# Patient Record
Sex: Female | Born: 1955 | Race: Black or African American | Hispanic: No | Smoking: Never smoker
Health system: Southern US, Community
[De-identification: ages and names within clinical notes are randomized; demographics above are authoritative.]

## PROBLEM LIST (undated history)

## (undated) DIAGNOSIS — F329 Major depressive disorder, single episode, unspecified: Secondary | ICD-10-CM

## (undated) DIAGNOSIS — G253 Myoclonus: Secondary | ICD-10-CM

## (undated) DIAGNOSIS — M549 Dorsalgia, unspecified: Secondary | ICD-10-CM

## (undated) DIAGNOSIS — F419 Anxiety disorder, unspecified: Secondary | ICD-10-CM

## (undated) DIAGNOSIS — R51 Headache: Secondary | ICD-10-CM

## (undated) DIAGNOSIS — E785 Hyperlipidemia, unspecified: Secondary | ICD-10-CM

## (undated) DIAGNOSIS — R519 Headache, unspecified: Secondary | ICD-10-CM

## (undated) DIAGNOSIS — M797 Fibromyalgia: Secondary | ICD-10-CM

## (undated) DIAGNOSIS — Z972 Presence of dental prosthetic device (complete) (partial): Secondary | ICD-10-CM

## (undated) DIAGNOSIS — G4700 Insomnia, unspecified: Secondary | ICD-10-CM

## (undated) DIAGNOSIS — M1990 Unspecified osteoarthritis, unspecified site: Secondary | ICD-10-CM

## (undated) DIAGNOSIS — I10 Essential (primary) hypertension: Secondary | ICD-10-CM

## (undated) DIAGNOSIS — R011 Cardiac murmur, unspecified: Secondary | ICD-10-CM

## (undated) DIAGNOSIS — R42 Dizziness and giddiness: Secondary | ICD-10-CM

## (undated) DIAGNOSIS — K219 Gastro-esophageal reflux disease without esophagitis: Secondary | ICD-10-CM

## (undated) DIAGNOSIS — R682 Dry mouth, unspecified: Secondary | ICD-10-CM

## (undated) DIAGNOSIS — F32A Depression, unspecified: Secondary | ICD-10-CM

## (undated) DIAGNOSIS — E119 Type 2 diabetes mellitus without complications: Secondary | ICD-10-CM

## (undated) DIAGNOSIS — K2270 Barrett's esophagus without dysplasia: Secondary | ICD-10-CM

## (undated) DIAGNOSIS — K5900 Constipation, unspecified: Secondary | ICD-10-CM

## (undated) DIAGNOSIS — G8929 Other chronic pain: Secondary | ICD-10-CM

## (undated) DIAGNOSIS — F259 Schizoaffective disorder, unspecified: Secondary | ICD-10-CM

## (undated) HISTORY — DX: Fibromyalgia: M79.7

## (undated) HISTORY — DX: Depression, unspecified: F32.A

## (undated) HISTORY — DX: Dry mouth, unspecified: R68.2

## (undated) HISTORY — DX: Hyperlipidemia, unspecified: E78.5

## (undated) HISTORY — PX: HAND SURGERY: SHX662

## (undated) HISTORY — PX: BUNIONECTOMY: SHX129

## (undated) HISTORY — DX: Dizziness and giddiness: R42

## (undated) HISTORY — DX: Other chronic pain: G89.29

## (undated) HISTORY — DX: Headache, unspecified: R51.9

## (undated) HISTORY — DX: Gastro-esophageal reflux disease without esophagitis: K21.9

## (undated) HISTORY — DX: Anxiety disorder, unspecified: F41.9

## (undated) HISTORY — DX: Dorsalgia, unspecified: M54.9

## (undated) HISTORY — PX: BREAST EXCISIONAL BIOPSY: SUR124

## (undated) HISTORY — DX: Insomnia, unspecified: G47.00

## (undated) HISTORY — DX: Presence of dental prosthetic device (complete) (partial): Z97.2

## (undated) HISTORY — PX: ABDOMINAL HYSTERECTOMY: SHX81

## (undated) HISTORY — DX: Myoclonus: G25.3

## (undated) HISTORY — PX: MULTIPLE TOOTH EXTRACTIONS: SHX2053

## (undated) HISTORY — DX: Schizoaffective disorder, unspecified: F25.9

## (undated) HISTORY — DX: Headache: R51

## (undated) HISTORY — DX: Type 2 diabetes mellitus without complications: E11.9

## (undated) HISTORY — DX: Major depressive disorder, single episode, unspecified: F32.9

## (undated) HISTORY — DX: Unspecified osteoarthritis, unspecified site: M19.90

## (undated) HISTORY — DX: Constipation, unspecified: K59.00

## (undated) HISTORY — PX: TRIGGER FINGER RELEASE: SHX641

## (undated) HISTORY — DX: Barrett's esophagus without dysplasia: K22.70

---

## 2000-04-29 ENCOUNTER — Ambulatory Visit (HOSPITAL_BASED_OUTPATIENT_CLINIC_OR_DEPARTMENT_OTHER): Admission: RE | Admit: 2000-04-29 | Discharge: 2000-04-29 | Payer: Self-pay | Admitting: Orthopedic Surgery

## 2000-08-24 ENCOUNTER — Encounter: Admission: RE | Admit: 2000-08-24 | Discharge: 2000-08-24 | Payer: Self-pay | Admitting: Rheumatology

## 2000-08-24 ENCOUNTER — Encounter: Payer: Self-pay | Admitting: Rheumatology

## 2000-12-16 ENCOUNTER — Ambulatory Visit (HOSPITAL_BASED_OUTPATIENT_CLINIC_OR_DEPARTMENT_OTHER): Admission: RE | Admit: 2000-12-16 | Discharge: 2000-12-16 | Payer: Self-pay | Admitting: Orthopedic Surgery

## 2001-01-26 ENCOUNTER — Ambulatory Visit (HOSPITAL_BASED_OUTPATIENT_CLINIC_OR_DEPARTMENT_OTHER): Admission: RE | Admit: 2001-01-26 | Discharge: 2001-01-26 | Payer: Self-pay | Admitting: Orthopedic Surgery

## 2001-09-27 ENCOUNTER — Other Ambulatory Visit: Admission: RE | Admit: 2001-09-27 | Discharge: 2001-09-27 | Payer: Self-pay | Admitting: Internal Medicine

## 2013-08-15 HISTORY — PX: COLONOSCOPY: SHX174

## 2013-09-15 DIAGNOSIS — M159 Polyosteoarthritis, unspecified: Secondary | ICD-10-CM

## 2013-09-15 HISTORY — DX: Polyosteoarthritis, unspecified: M15.9

## 2014-01-03 DIAGNOSIS — M51379 Other intervertebral disc degeneration, lumbosacral region without mention of lumbar back pain or lower extremity pain: Secondary | ICD-10-CM

## 2014-01-03 DIAGNOSIS — M5137 Other intervertebral disc degeneration, lumbosacral region: Secondary | ICD-10-CM | POA: Insufficient documentation

## 2014-01-03 HISTORY — DX: Other intervertebral disc degeneration, lumbosacral region without mention of lumbar back pain or lower extremity pain: M51.379

## 2014-04-07 DIAGNOSIS — E119 Type 2 diabetes mellitus without complications: Secondary | ICD-10-CM

## 2014-04-07 HISTORY — DX: Type 2 diabetes mellitus without complications: E11.9

## 2016-02-06 DIAGNOSIS — M67919 Unspecified disorder of synovium and tendon, unspecified shoulder: Secondary | ICD-10-CM | POA: Insufficient documentation

## 2016-02-06 DIAGNOSIS — M7750 Other enthesopathy of unspecified foot: Secondary | ICD-10-CM | POA: Insufficient documentation

## 2016-02-06 DIAGNOSIS — J019 Acute sinusitis, unspecified: Secondary | ICD-10-CM

## 2016-02-06 DIAGNOSIS — M7072 Other bursitis of hip, left hip: Secondary | ICD-10-CM

## 2016-02-06 DIAGNOSIS — L309 Dermatitis, unspecified: Secondary | ICD-10-CM

## 2016-02-06 DIAGNOSIS — R55 Syncope and collapse: Secondary | ICD-10-CM

## 2016-02-06 DIAGNOSIS — M5412 Radiculopathy, cervical region: Secondary | ICD-10-CM

## 2016-02-06 HISTORY — DX: Unspecified disorder of synovium and tendon, unspecified shoulder: M67.919

## 2016-02-06 HISTORY — DX: Other enthesopathy of unspecified foot and ankle: M77.50

## 2016-02-06 HISTORY — DX: Other bursitis of hip, left hip: M70.72

## 2016-02-06 HISTORY — DX: Dermatitis, unspecified: L30.9

## 2016-02-06 HISTORY — DX: Syncope and collapse: R55

## 2016-02-06 HISTORY — DX: Radiculopathy, cervical region: M54.12

## 2016-02-06 HISTORY — DX: Acute sinusitis, unspecified: J01.90

## 2016-02-07 DIAGNOSIS — M722 Plantar fascial fibromatosis: Secondary | ICD-10-CM | POA: Insufficient documentation

## 2016-02-07 DIAGNOSIS — N952 Postmenopausal atrophic vaginitis: Secondary | ICD-10-CM | POA: Insufficient documentation

## 2016-02-07 DIAGNOSIS — T65221A Toxic effect of tobacco cigarettes, accidental (unintentional), initial encounter: Secondary | ICD-10-CM

## 2016-02-07 DIAGNOSIS — M5030 Other cervical disc degeneration, unspecified cervical region: Secondary | ICD-10-CM | POA: Insufficient documentation

## 2016-02-07 DIAGNOSIS — G43909 Migraine, unspecified, not intractable, without status migrainosus: Secondary | ICD-10-CM | POA: Insufficient documentation

## 2016-02-07 DIAGNOSIS — K140 Glossitis: Secondary | ICD-10-CM

## 2016-02-07 DIAGNOSIS — M791 Myalgia, unspecified site: Secondary | ICD-10-CM

## 2016-02-07 DIAGNOSIS — H9209 Otalgia, unspecified ear: Secondary | ICD-10-CM

## 2016-02-07 DIAGNOSIS — G4761 Periodic limb movement disorder: Secondary | ICD-10-CM

## 2016-02-07 DIAGNOSIS — E669 Obesity, unspecified: Secondary | ICD-10-CM

## 2016-02-07 DIAGNOSIS — N905 Atrophy of vulva: Secondary | ICD-10-CM

## 2016-02-07 DIAGNOSIS — N959 Unspecified menopausal and perimenopausal disorder: Secondary | ICD-10-CM

## 2016-02-07 DIAGNOSIS — J301 Allergic rhinitis due to pollen: Secondary | ICD-10-CM | POA: Insufficient documentation

## 2016-02-07 DIAGNOSIS — M25519 Pain in unspecified shoulder: Secondary | ICD-10-CM

## 2016-02-07 DIAGNOSIS — M609 Myositis, unspecified: Secondary | ICD-10-CM

## 2016-02-07 DIAGNOSIS — G2581 Restless legs syndrome: Secondary | ICD-10-CM | POA: Insufficient documentation

## 2016-02-07 DIAGNOSIS — M25569 Pain in unspecified knee: Secondary | ICD-10-CM

## 2016-02-07 HISTORY — DX: Myositis, unspecified: M60.9

## 2016-02-07 HISTORY — DX: Atrophy of vulva: N90.5

## 2016-02-07 HISTORY — DX: Restless legs syndrome: G25.81

## 2016-02-07 HISTORY — DX: Pain in unspecified knee: M25.569

## 2016-02-07 HISTORY — DX: Otalgia, unspecified ear: H92.09

## 2016-02-07 HISTORY — DX: Unspecified menopausal and perimenopausal disorder: N95.9

## 2016-02-07 HISTORY — DX: Other cervical disc degeneration, unspecified cervical region: M50.30

## 2016-02-07 HISTORY — DX: Pain in unspecified shoulder: M25.519

## 2016-02-07 HISTORY — DX: Allergic rhinitis due to pollen: J30.1

## 2016-02-07 HISTORY — DX: Toxic effect of tobacco cigarettes, accidental (unintentional), initial encounter: T65.221A

## 2016-02-07 HISTORY — DX: Glossitis: K14.0

## 2016-02-07 HISTORY — DX: Periodic limb movement disorder: G47.61

## 2016-02-07 HISTORY — DX: Obesity, unspecified: E66.9

## 2016-02-07 HISTORY — DX: Migraine, unspecified, not intractable, without status migrainosus: G43.909

## 2016-02-07 HISTORY — DX: Morbid (severe) obesity due to excess calories: E66.01

## 2016-02-07 HISTORY — DX: Plantar fascial fibromatosis: M72.2

## 2016-02-07 HISTORY — DX: Myalgia, unspecified site: M79.10

## 2016-02-07 HISTORY — DX: Postmenopausal atrophic vaginitis: N95.2

## 2016-07-08 DIAGNOSIS — R441 Visual hallucinations: Secondary | ICD-10-CM | POA: Insufficient documentation

## 2016-07-08 DIAGNOSIS — R440 Auditory hallucinations: Secondary | ICD-10-CM

## 2016-07-08 HISTORY — DX: Auditory hallucinations: R44.0

## 2016-07-08 HISTORY — DX: Visual hallucinations: R44.1

## 2017-01-06 ENCOUNTER — Ambulatory Visit: Payer: Medicare (Managed Care) | Admitting: Psychology

## 2017-01-26 DIAGNOSIS — H2513 Age-related nuclear cataract, bilateral: Secondary | ICD-10-CM | POA: Insufficient documentation

## 2017-01-26 DIAGNOSIS — H18599 Other hereditary corneal dystrophies, unspecified eye: Secondary | ICD-10-CM

## 2017-01-26 DIAGNOSIS — H25013 Cortical age-related cataract, bilateral: Secondary | ICD-10-CM | POA: Insufficient documentation

## 2017-01-26 DIAGNOSIS — H524 Presbyopia: Secondary | ICD-10-CM | POA: Insufficient documentation

## 2017-01-26 HISTORY — DX: Age-related nuclear cataract, bilateral: H25.13

## 2017-01-26 HISTORY — DX: Presbyopia: H52.4

## 2017-01-26 HISTORY — DX: Other hereditary corneal dystrophies, unspecified eye: H18.599

## 2017-01-26 HISTORY — DX: Cortical age-related cataract, bilateral: H25.013

## 2017-01-27 ENCOUNTER — Other Ambulatory Visit: Payer: Self-pay | Admitting: Internal Medicine

## 2017-01-27 ENCOUNTER — Ambulatory Visit
Admission: RE | Admit: 2017-01-27 | Discharge: 2017-01-27 | Disposition: A | Discharge status: Home / Self Care | Payer: Medicare (Managed Care) | Source: Ambulatory Visit | Attending: Internal Medicine | Admitting: Internal Medicine

## 2017-01-27 DIAGNOSIS — M545 Low back pain: Secondary | ICD-10-CM

## 2017-03-18 ENCOUNTER — Other Ambulatory Visit: Payer: Self-pay | Admitting: Internal Medicine

## 2017-03-18 DIAGNOSIS — Z1231 Encounter for screening mammogram for malignant neoplasm of breast: Secondary | ICD-10-CM

## 2017-04-07 ENCOUNTER — Ambulatory Visit
Admission: RE | Admit: 2017-04-07 | Discharge: 2017-04-07 | Disposition: A | Discharge status: Home / Self Care | Payer: Medicare (Managed Care) | Source: Ambulatory Visit | Attending: Internal Medicine | Admitting: Internal Medicine

## 2017-04-07 DIAGNOSIS — Z1231 Encounter for screening mammogram for malignant neoplasm of breast: Secondary | ICD-10-CM

## 2017-09-21 ENCOUNTER — Ambulatory Visit (INDEPENDENT_AMBULATORY_CARE_PROVIDER_SITE_OTHER): Payer: Medicare (Managed Care) | Admitting: Psychology

## 2017-09-21 DIAGNOSIS — F250 Schizoaffective disorder, bipolar type: Secondary | ICD-10-CM | POA: Diagnosis not present

## 2017-09-22 ENCOUNTER — Encounter: Payer: Self-pay | Admitting: Nurse Practitioner

## 2017-09-22 ENCOUNTER — Ambulatory Visit: Payer: Self-pay | Admitting: Psychology

## 2017-09-22 ENCOUNTER — Ambulatory Visit (INDEPENDENT_AMBULATORY_CARE_PROVIDER_SITE_OTHER): Payer: Medicare (Managed Care) | Admitting: Nurse Practitioner

## 2017-09-22 VITALS — BP 110/70 | HR 80 | Ht 64.0 in | Wt 247.0 lb

## 2017-09-22 DIAGNOSIS — K22719 Barrett's esophagus with dysplasia, unspecified: Secondary | ICD-10-CM

## 2017-09-22 DIAGNOSIS — D649 Anemia, unspecified: Secondary | ICD-10-CM | POA: Diagnosis not present

## 2017-09-22 DIAGNOSIS — Z860100 Personal history of colon polyps, unspecified: Secondary | ICD-10-CM

## 2017-09-22 DIAGNOSIS — K5909 Other constipation: Secondary | ICD-10-CM | POA: Diagnosis not present

## 2017-09-22 NOTE — Progress Notes (Addendum)
Addendum 10/14/2017: Records received from Dr. Ferdinand Lango  EGD 09/26/2015 for evaluation of abdominal pain and GERD.  Findings included esophagitis and gastritis.    Antral, gastric and cardia biopsies compatible with chronic gastritis with H. pylori.  No intestinal metaplasia.     Biopsies 2 cm above the GE junction show inflamed gastric-type mucosa with no Barrett's.    Biopsies 4 cm above GE junction inflamed gastric-type mucosa with intestinal metaplasia.  No dysplasia or malignancy.  Esophageal biopsies at 35, 30, 28 and 25, 23 and 20 cm all compatible with gastroesophageal reflux disease.  Colonoscopy 09/18/2014 done for personal history of colon polyps and constipation.  Stent of exam to the cecum.  Bowel prep was inadequate.  No polyps were seen.  Erythema and erosion at 55 cm.  Biopsy of the area showed reactive edematous colonic mucosa with erosion.  No dysplasia.  Colonoscopy September 2014.  Extent of exam to the cecum.  Inadequate bowel prep.  10 mm cecal polyp removed.  Path compatible tubular adenoma without high-grade dysplasia.  I will scanned records into epic and also send a copy to Dr.Danis so he can decide about the timing of repeat upper endoscopy for history of Barrett's disease and also the timing of repeat colonoscopy.  Her bowel prep in 2014 and 2016 were in adequate so she will need a 2-day prep for sure __________________________________________________________________ HPI: Patient is a 61 year old female referred by Dr. Dorian Pod with [facility], for evaluation of Barrett's esophagus. I reviewed records sent by Dr. Jimmye Norman. Patient has well-controlled DM 2, schizoaffective disorder bipolar type., myofascial pain syndrome , chronic constipation, GERD and history of Barrett's esophagus. Per PCP patient's last EGD was in 2016 by Dr. Harrell Lark with [facility]. I do not have GI records. Patient takes Zantac for GERD. She has chronic  constipation and requires Linzess and MiraLAX. No rectal bleeding. Patient says she had a colonoscopy 1.5 years ago, also by Dr. Ferdinand Lango, told she needed another colonoscopy in one year so she is 6 months overdue. Again I do not have GI records. Patient tells me she is switching from Dr. Ferdinand Lango to our practice because this is where the Dr. Jimmye Norman sent her.   Ms Graffius has no complaints. She denies GERD sx. Takes daily Zantac. Bowels moving okay. No blood in her stool. No abdominal pain  Labs dated 07/24/17 reveal a hemoglobin of 10.4, MCV 92. Hemoglobin is stable, it was 10.4 in January.Renal function is normal   Past Medical History:  Diagnosis Date  . Barrett's esophagus   . Chronic headaches   . Diabetes ([place])   . Fibromyalgia   . GERD (gastroesophageal reflux disease)   . Hyperlipemia   . Myoclonus   . Osteoarthritis   . Vertigo      Past Surgical History:  Procedure Laterality Date  . ABDOMINAL HYSTERECTOMY    . BREAST EXCISIONAL BIOPSY Right   . BREAST EXCISIONAL BIOPSY Right   . BREAST EXCISIONAL BIOPSY Right   . BREAST EXCISIONAL BIOPSY Right    Family History  Problem Relation Age of Onset  . Diabetes Mother   . Prostate cancer Father    Social History  Substance Use Topics  . Smoking status: Never Smoker  . Smokeless tobacco: Not on file  . Alcohol use No   Current Outpatient Prescriptions  Medication Sig Dispense Refill  . acetaminophen (TYLENOL) 650 MG suppository Place 650 mg rectally 2 (two) times daily.    Marland Kitchen  antiseptic oral rinse (BIOTENE) LIQD 15 mLs by Mouth Rinse route 6 (six) times daily.    . Artificial Saliva (BIOTENE MOISTURIZING MOUTH MT) Use as directed 1 spray in the mouth or throat 6 (six) times daily.    Marland Kitchen aspirin EC 81 MG tablet Take 81 mg by mouth daily.    . DULoxetine (CYMBALTA) 60 MG capsule Take 60 mg by mouth 2 (two) times daily.    . fluPHENAZine (PROLIXIN) 5 MG tablet Take 5 mg by mouth 2 (two) times daily.    Marland Kitchen gabapentin  (NEURONTIN) 600 MG tablet Take 600 mg by mouth at bedtime.    Marland Kitchen linaclotide (LINZESS) 145 MCG CAPS capsule Take 145 mcg by mouth daily before breakfast.    . Melatonin 10 MG CAPS Take 1 capsule by mouth daily.    . Menthol, Topical Analgesic, (BIOFREEZE) 4 % GEL Apply 1 application topically 2 (two) times daily as needed.    . nortriptyline (PAMELOR) 50 MG capsule Take 50 mg by mouth at bedtime.    . paliperidone (INVEGA SUSTENNA) 156 MG/ML SUSP injection Inject 156 mg into the muscle every 30 (thirty) days.    . polyethylene glycol (MIRALAX / GLYCOLAX) packet Take 17 g by mouth daily.    . ranitidine (ZANTAC) 300 MG tablet Take 300 mg by mouth at bedtime.    . risperiDONE (RISPERDAL) 2 MG tablet Take 2 mg by mouth 2 (two) times daily.    . sodium chloride (MURO 128) 5 % ophthalmic ointment Place 1 application into both eyes 4 (four) times daily as needed for eye irritation.     No current facility-administered medications for this visit.    Allergies  Allergen Reactions  . Amitriptyline   . Flexeril [Cyclobenzaprine]   . Penicillins      Review of Systems: Positive for anxiety, arthritis, back pain, vision changes, depression, headaches, heart murmur, sleeping problems and urine leakage All other systems reviewed and negative except where noted in HPI.    Physical Exam: BP 110/70   Pulse 80   Ht 5\' 4"  (1.626 m)   Wt 247 lb (112 kg)   BMI 42.40 kg/m  Constitutional:  Obese black female in no acute distress. Psychiatric: Pleasant, cooperative, flat affect. EENT: Pupils normal.  Conjunctivae are normal. No scleral icterus. Neck supple.  Cardiovascular: Normal rate, regular rhythm. No edema Pulmonary/chest: Effort normal and breath sounds normal. No wheezing, rales or rhonchi. Abdominal: Soft, nondistended. Nontender. Bowel sounds active throughout. There are no masses palpable. No hepatomegaly. Lymphadenopathy: No cervical adenopathy noted. Neurological: Alert and oriented to  person place and time. Skin: Skin is warm and dry. No rashes noted.   ASSESSMENT AND PLAN:  60. 61 yo female with history of GERD  / Barrett's esophagus followed by Dr. Harrell Lark. I don't have GI records but last EGD apparently done in 2016. She has occasional pyrosis, takes daily Zantac.  -Patient believes she is due for surveillance EGD. I have requested records from Dr. Ferdinand Lango. Once reviewed we will contact patient about date of next EGD.   2. Hx of colon polyps per patient. I do not have GI records. Patient says she had a colonoscopy 1.5 years ago by Dr. Harrell Lark and is overdue for one year follow-up.  -I will need to await Dr. Ferdinand Lango records and get back with the patient with recommendations regarding surveillance colonoscopy  3. Chronic mild normocytic anemia. Hemoglobin has been stable in the upper 10 range. No overt GI blood loss.  4. Well controlled DM2  5. Schizoaffective disorder. She is on prolixin, invega and risperidal  6. Chronic constipation, likely medication induced. Controlled with linzess and miralax.   I spent 25 minutes of face-to-face time with the patient. Greater than 50% of the time was spent counseling and coordinating care. Questions answered  Tye Savoy, NP  09/22/2017, 11:41 AM  Cc:  Dorian Pod, MD

## 2017-09-22 NOTE — Patient Instructions (Signed)
If you are age 62 or older, your body mass index should be between 23-30. Your Body mass index is 42.4 kg/m. If this is out of the aforementioned range listed, please consider follow up with your Primary Care Provider.  If you are age 40 or younger, your body mass index should be between 19-25. Your Body mass index is 42.4 kg/m. If this is out of the aformentioned range listed, please consider follow up with your Primary Care Provider.   We have requested GI records from Dr. Ferdinand Lango at [facility].  We will call regarding EGD/ Colon after records are reviewed.  Thank you for choosing me and [facility].   Tye Savoy, NP

## 2017-09-24 NOTE — Progress Notes (Addendum)
Thank you for sending this case to me. I have reviewed the entire note, and the outlined plan seems appropriate.  My experience is that any and all reported endoscopic findings by Dr. Ferdinand Lango are questionable. We must have records to see if this can be clarified before deciding if procedures are warranted at this time.  Wilfrid Lund, MD   10/20/17 Addendum:    Thank you for the record review and update. Still unclear to me if true Barrett's. Also unclear what is meant by inadequate prep, but we must assume it was an accurate description and limiting mucosal visualization.  Please schedule EGD (Dx: Barrett's without dysplasia)  and colonoscopy (Dx: history of adenomatous colon polyp).  Yes, needs 2 day colon prep and early to mid AM appointment in case shows up incompletely prepped.  OK for [facility].  Wilfrid Lund, MD

## 2017-10-08 ENCOUNTER — Ambulatory Visit (INDEPENDENT_AMBULATORY_CARE_PROVIDER_SITE_OTHER): Payer: Self-pay | Admitting: Psychology

## 2017-10-08 DIAGNOSIS — F250 Schizoaffective disorder, bipolar type: Secondary | ICD-10-CM

## 2017-10-20 ENCOUNTER — Telehealth: Payer: Self-pay | Admitting: Gastroenterology

## 2017-10-20 NOTE — Telephone Encounter (Signed)
Please see my addendum to your record review with plan to schedule procedures. - HD

## 2017-10-22 ENCOUNTER — Telehealth: Payer: Self-pay

## 2017-10-22 ENCOUNTER — Other Ambulatory Visit: Payer: Self-pay

## 2017-10-22 MED ORDER — POLYETHYLENE GLYCOL 3350 17 GM/SCOOP PO POWD: ORAL | 0 refills | Status: DC

## 2017-10-22 MED ORDER — SUPREP BOWEL PREP KIT 17.5-3.13-1.6 GM/177ML PO SOLN: 1.0000 | ORAL | 0 refills | Status: DC

## 2017-10-22 NOTE — Telephone Encounter (Signed)
Rx faxed to [facility] [phone]

## 2017-10-22 NOTE — Telephone Encounter (Signed)
Patient contacted and scheduled for the pre-op visit and her EGD/colon. [facility] provides transportation. They have been contacted. Spoke to [place]. Pre-op visit 11/10/17 at 1:30 pm. Procedure date 11/20/17 arrive 2:00 pm with a care partner. The prescription for her 2 day prep faxed to

## 2017-11-12 ENCOUNTER — Ambulatory Visit: Payer: Medicaid Other | Admitting: Psychology

## 2017-11-20 ENCOUNTER — Encounter: Payer: Self-pay | Admitting: Gastroenterology

## 2017-12-24 ENCOUNTER — Ambulatory Visit (AMBULATORY_SURGERY_CENTER): Payer: Self-pay | Admitting: *Deleted

## 2017-12-24 ENCOUNTER — Other Ambulatory Visit: Payer: Self-pay

## 2017-12-24 ENCOUNTER — Telehealth: Payer: Self-pay | Admitting: Gastroenterology

## 2017-12-24 VITALS — Ht 63.0 in | Wt 247.2 lb

## 2017-12-24 DIAGNOSIS — Z86010 Personal history of colonic polyps: Secondary | ICD-10-CM

## 2017-12-24 DIAGNOSIS — K22719 Barrett's esophagus with dysplasia, unspecified: Secondary | ICD-10-CM

## 2017-12-24 MED ORDER — NA SULFATE-K SULFATE-MG SULF 17.5-3.13-1.6 GM/177ML PO SOLN: 1.0000 | Freq: Once | ORAL | 0 refills | Status: AC

## 2017-12-24 MED ORDER — POLYETHYLENE GLYCOL 3350 17 GM/SCOOP PO POWD: 1.0000 | Freq: Every day | ORAL | 3 refills | Status: DC

## 2017-12-24 MED ORDER — BISACODYL 5 MG PO TBEC: 5.0000 mg | DELAYED_RELEASE_TABLET | Freq: Every day | ORAL | 0 refills | Status: DC | PRN

## 2017-12-24 NOTE — Progress Notes (Signed)
Patient denies any allergies to egg or soy products. Patient denies complications with anesthesia/sedation.  Patient denies oxygen use at home and denies diet medications. Pamphlets given to patient on colonoscopy and endoscopy.  [facility] uses [facility].  Written prescriptions were given to patient at [facility] appointment for suprep, dulcolax and miralax.  Dr Loletha Carrow signed all three prescriptions.  Patient will take them to [facility] in [place] to get filled.

## 2017-12-24 NOTE — Telephone Encounter (Signed)
Spoke to [place] at [facility] discussed instructions- Faxed a set of instructions to her at fax number [phone]  Lelan Pons PV

## 2018-01-07 ENCOUNTER — Ambulatory Visit (AMBULATORY_SURGERY_CENTER): Payer: Medicare (Managed Care) | Admitting: Gastroenterology

## 2018-01-07 ENCOUNTER — Other Ambulatory Visit: Payer: Self-pay

## 2018-01-07 ENCOUNTER — Encounter: Payer: Self-pay | Admitting: Gastroenterology

## 2018-01-07 VITALS — BP 125/73 | HR 81 | Temp 98.0°F | Resp 10 | Ht 63.0 in | Wt 247.0 lb

## 2018-01-07 DIAGNOSIS — Z860100 Personal history of colon polyps, unspecified: Secondary | ICD-10-CM

## 2018-01-07 DIAGNOSIS — D123 Benign neoplasm of transverse colon: Secondary | ICD-10-CM | POA: Diagnosis not present

## 2018-01-07 DIAGNOSIS — D122 Benign neoplasm of ascending colon: Secondary | ICD-10-CM

## 2018-01-07 DIAGNOSIS — K22719 Barrett's esophagus with dysplasia, unspecified: Secondary | ICD-10-CM | POA: Diagnosis not present

## 2018-01-07 MED ORDER — SODIUM CHLORIDE 0.9 % IV SOLN: 500.0000 mL | Freq: Once | INTRAVENOUS | Status: DC

## 2018-01-07 NOTE — Progress Notes (Signed)
Called to room to assist during endoscopic procedure.  Patient ID and intended procedure confirmed with present staff. Received instructions for my participation in the procedure from the performing physician.  

## 2018-01-07 NOTE — Op Note (Signed)
[facility] Patient Name: Bridget [name] Procedure Date: 01/07/2018 1:52 PM MRN: [id number] Endoscopist: Mallie Mussel L. Loletha Carrow , MD Age: 62 Referring MD:  Date of Birth: 01/26/1956 Gender: Female Account #: [id number] Procedure:                Colonoscopy Indications:              Surveillance: Personal history of adenomatous                            polyps on last colonoscopy > 3 years ago (tubular                            adenoma 2014, no polyps 2015, "inadequate prep"                            reported on both exams by Dr. Virgel Bouquet) Medicines:                Monitored Anesthesia Care Procedure:                Pre-Anesthesia Assessment:                           - Prior to the procedure, a History and Physical                            was performed, and patient medications and                            allergies were reviewed. The patient's tolerance of                            previous anesthesia was also reviewed. The risks                            and benefits of the procedure and the sedation                            options and risks were discussed with the patient.                            All questions were answered, and informed consent                            was obtained. Prior Anticoagulants: The patient has                            taken no previous anticoagulant or antiplatelet                            agents. ASA Grade Assessment: II - A patient with                            mild systemic disease. After reviewing the risks  and benefits, the patient was deemed in                            satisfactory condition to undergo the procedure.                           After obtaining informed consent, the colonoscope                            was passed under direct vision. Throughout the                            procedure, the patient's blood pressure, pulse, and                            oxygen saturations  were monitored continuously. The                            Colonoscope was introduced through the anus and                            advanced to the the cecum, identified by                            appendiceal orifice and ileocecal valve. The                            colonoscopy was performed without difficulty. The                            patient tolerated the procedure well. The quality                            of the bowel preparation was good. The ileocecal                            valve, appendiceal orifice, and rectum were                            photographed. The bowel preparation used was                            Miralax and SUPREP (2-day). Scope In: 2:06:05 PM Scope Out: 2:20:33 PM Scope Withdrawal Time: 0 hours 10 minutes 48 seconds  Total Procedure Duration: 0 hours 14 minutes 28 seconds  Findings:                 The perianal and digital rectal examinations were                            normal.                           Two sessile polyps were found in the transverse  colon and ascending colon. The polyps were 2 to 4                            mm in size. These polyps were removed with a cold                            snare. Resection and retrieval were complete.                           Retroflexion in the rectum was not performed due to                            anatomy.                           The exam was otherwise without abnormality. Complications:            No immediate complications. Estimated Blood Loss:     Estimated blood loss was minimal. Impression:               - Two 2 to 4 mm polyps in the transverse colon and                            in the ascending colon, removed with a cold snare.                            Resected and retrieved.                           - The examination was otherwise normal. Recommendation:           - Patient has a contact number available for                             emergencies. The signs and symptoms of potential                            delayed complications were discussed with the                            patient. Return to normal activities tomorrow.                            Written discharge instructions were provided to the                            patient.                           - Resume previous diet.                           - Continue present medications.                           - Await pathology results.                           -  Repeat colonoscopy is recommended for                            surveillance. The colonoscopy date will be                            determined after pathology results from today's                            exam become available for review. Dreyah Montrose L. Loletha Carrow, MD 01/07/2018 2:28:13 PM This report has been signed electronically.

## 2018-01-07 NOTE — Progress Notes (Signed)
Report given to PACU, vss 

## 2018-01-07 NOTE — Patient Instructions (Signed)
Discharge instructions given. Handout on polyps. Resume previous medications. YOU HAD AN ENDOSCOPIC PROCEDURE TODAY AT [facility]:   Refer to the procedure report that was given to you for any specific questions about what was found during the examination.  If the procedure report does not answer your questions, please call your gastroenterologist to clarify.  If you requested that your care partner not be given the details of your procedure findings, then the procedure report has been included in a sealed envelope for you to review at your convenience later.  YOU SHOULD EXPECT: Some feelings of bloating in the abdomen. Passage of more gas than usual.  Walking can help get rid of the air that was put into your GI tract during the procedure and reduce the bloating. If you had a lower endoscopy (such as a colonoscopy or flexible sigmoidoscopy) you may notice spotting of blood in your stool or on the toilet paper. If you underwent a bowel prep for your procedure, you may not have a normal bowel movement for a few days.  Please Note:  You might notice some irritation and congestion in your nose or some drainage.  This is from the oxygen used during your procedure.  There is no need for concern and it should clear up in a day or so.  SYMPTOMS TO REPORT IMMEDIATELY:   Following lower endoscopy (colonoscopy or flexible sigmoidoscopy):  Excessive amounts of blood in the stool  Significant tenderness or worsening of abdominal pains  Swelling of the abdomen that is new, acute  Fever of 100F or higher   Following upper endoscopy (EGD)  Vomiting of blood or coffee ground material  New chest pain or pain under the shoulder blades  Painful or persistently difficult swallowing  New shortness of breath  Fever of 100F or higher  Black, tarry-looking stools  For urgent or emergent issues, a gastroenterologist can be reached at any hour by calling [phone].   DIET:  We do  recommend a small meal at first, but then you may proceed to your regular diet.  Drink plenty of fluids but you should avoid alcoholic beverages for 24 hours.  ACTIVITY:  You should plan to take it easy for the rest of today and you should NOT DRIVE or use heavy machinery until tomorrow (because of the sedation medicines used during the test).    FOLLOW UP: Our staff will call the number listed on your records the next business day following your procedure to check on you and address any questions or concerns that you may have regarding the information given to you following your procedure. If we do not reach you, we will leave a message.  However, if you are feeling well and you are not experiencing any problems, there is no need to return our call.  We will assume that you have returned to your regular daily activities without incident.  If any biopsies were taken you will be contacted by phone or by letter within the next 1-3 weeks.  Please call us at [phone] if you have not heard about the biopsies in 3 weeks.    SIGNATURES/CONFIDENTIALITY: You and/or your care partner have signed paperwork which will be entered into your electronic medical record.  These signatures attest to the fact that that the information above on your After Visit Summary has been reviewed and is understood.  Full responsibility of the confidentiality of this discharge information lies with you and/or your care-partner.

## 2018-01-07 NOTE — Op Note (Signed)
[facility] Patient Name: Bridget [name] Procedure Date: 01/07/2018 1:53 PM MRN: [id number] Endoscopist: Mallie Mussel L. Loletha Carrow , MD Age: 62 Referring MD:  Date of Birth: Jan 26, 1956 Gender: Female Account #: [id number] Procedure:                Upper GI endoscopy Indications:              Barrett's esophagus (based on prior EGD and                            pathology reports by Dr. Virgel Bouquet) Medicines:                Monitored Anesthesia Care Procedure:                Pre-Anesthesia Assessment:                           - Prior to the procedure, a History and Physical                            was performed, and patient medications and                            allergies were reviewed. The patient's tolerance of                            previous anesthesia was also reviewed. The risks                            and benefits of the procedure and the sedation                            options and risks were discussed with the patient.                            All questions were answered, and informed consent                            was obtained. Prior Anticoagulants: The patient has                            taken no previous anticoagulant or antiplatelet                            agents. ASA Grade Assessment: II - A patient with                            mild systemic disease. After reviewing the risks                            and benefits, the patient was deemed in                            satisfactory condition to undergo the procedure.  After obtaining informed consent, the endoscope was                            passed under direct vision. Throughout the                            procedure, the patient's blood pressure, pulse, and                            oxygen saturations were monitored continuously. The                            Endoscope was introduced through the mouth, and                            advanced to the second  part of duodenum. The upper                            GI endoscopy was accomplished without difficulty.                            The patient tolerated the procedure well. Scope In: Scope Out: Findings:                 The examined esophagus was normal.                           The stomach was normal.                           The cardia and gastric fundus were normal on                            retroflexion.                           The examined duodenum was normal. Complications:            No immediate complications. Estimated Blood Loss:     Estimated blood loss: none. Impression:               - Normal esophagus. No Barrett's esophagus seen.                            Therefore, no future surveillance EGD needed.                           - Normal stomach.                           - Normal examined duodenum.                           - No specimens collected. Recommendation:           - Patient has a contact number available for  emergencies. The signs and symptoms of potential                            delayed complications were discussed with the                            patient. Return to normal activities tomorrow.                            Written discharge instructions were provided to the                            patient.                           - Resume previous diet.                           - Continue present medications.                           - See the other procedure note for documentation of                            additional recommendations. Henry L. Loletha Carrow, MD 01/07/2018 2:24:18 PM This report has been signed electronically.

## 2018-01-08 ENCOUNTER — Telehealth: Payer: Self-pay

## 2018-01-08 NOTE — Telephone Encounter (Signed)
  Follow up Call-  Call back number 01/07/2018  Post procedure Call Back phone  # [phone]  Permission to leave phone message Yes  Some recent data might be hidden     Patient questions:  Do you have a fever, pain , or abdominal swelling? No. Pain Score  0 *  Have you tolerated food without any problems? Yes.    Have you been able to return to your normal activities? Yes.    Do you have any questions about your discharge instructions: Diet   No. Medications  No. Follow up visit  No.  Do you have questions or concerns about your Care? No.  Actions: * If pain score is 4 or above: No action needed, pain <4.

## 2018-01-15 ENCOUNTER — Encounter: Payer: Self-pay | Admitting: Gastroenterology

## 2018-02-03 ENCOUNTER — Encounter (HOSPITAL_COMMUNITY): Payer: Self-pay | Admitting: Nurse Practitioner

## 2018-02-03 ENCOUNTER — Emergency Department (HOSPITAL_COMMUNITY)
Admission: EM | Admit: 2018-02-03 | Discharge: 2018-02-04 | Disposition: A | Discharge status: Home / Self Care | Payer: Medicare (Managed Care) | Attending: Emergency Medicine | Admitting: Emergency Medicine

## 2018-02-03 ENCOUNTER — Other Ambulatory Visit: Payer: Self-pay

## 2018-02-03 ENCOUNTER — Emergency Department (HOSPITAL_COMMUNITY): Payer: Medicare (Managed Care)

## 2018-02-03 DIAGNOSIS — Z79899 Other long term (current) drug therapy: Secondary | ICD-10-CM | POA: Insufficient documentation

## 2018-02-03 DIAGNOSIS — F250 Schizoaffective disorder, bipolar type: Secondary | ICD-10-CM | POA: Diagnosis present

## 2018-02-03 DIAGNOSIS — E119 Type 2 diabetes mellitus without complications: Secondary | ICD-10-CM | POA: Diagnosis not present

## 2018-02-03 DIAGNOSIS — Z7982 Long term (current) use of aspirin: Secondary | ICD-10-CM | POA: Diagnosis not present

## 2018-02-03 DIAGNOSIS — R440 Auditory hallucinations: Secondary | ICD-10-CM

## 2018-02-03 DIAGNOSIS — R4182 Altered mental status, unspecified: Secondary | ICD-10-CM | POA: Diagnosis present

## 2018-02-03 LAB — COMPREHENSIVE METABOLIC PANEL
ALT: 9 U/L — ABNORMAL LOW (ref 14–54)
AST: 17 U/L (ref 15–41)
Albumin: 3.8 g/dL (ref 3.5–5.0)
Alkaline Phosphatase: 49 U/L (ref 38–126)
Anion gap: 10 (ref 5–15)
BUN: 9 mg/dL (ref 6–20)
CO2: 26 mmol/L (ref 22–32)
Calcium: 9.3 mg/dL (ref 8.9–10.3)
Chloride: 103 mmol/L (ref 101–111)
Creatinine, Ser: 0.85 mg/dL (ref 0.44–1.00)
Glucose, Bld: 125 mg/dL — ABNORMAL HIGH (ref 65–99)
POTASSIUM: 3.5 mmol/L (ref 3.5–5.1)
SODIUM: 139 mmol/L (ref 135–145)
Total Bilirubin: 0.6 mg/dL (ref 0.3–1.2)
Total Protein: 7.8 g/dL (ref 6.5–8.1)

## 2018-02-03 LAB — RAPID URINE DRUG SCREEN, HOSP PERFORMED
Amphetamines: NOT DETECTED
BENZODIAZEPINES: NOT DETECTED
Barbiturates: NOT DETECTED
Cocaine: NOT DETECTED
OPIATES: NOT DETECTED
Tetrahydrocannabinol: NOT DETECTED

## 2018-02-03 LAB — CBC WITH DIFFERENTIAL/PLATELET
Basophils Absolute: 0 10*3/uL (ref 0.0–0.1)
Basophils Relative: 0 %
EOS ABS: 0.1 10*3/uL (ref 0.0–0.7)
EOS PCT: 1 %
HCT: 33.3 % — ABNORMAL LOW (ref 36.0–46.0)
Hemoglobin: 10.7 g/dL — ABNORMAL LOW (ref 12.0–15.0)
Lymphocytes Relative: 27 %
Lymphs Abs: 1.7 10*3/uL (ref 0.7–4.0)
MCH: 29.3 pg (ref 26.0–34.0)
MCHC: 32.1 g/dL (ref 30.0–36.0)
MCV: 91.2 fL (ref 78.0–100.0)
MONO ABS: 0.4 10*3/uL (ref 0.1–1.0)
MONOS PCT: 6 %
Neutro Abs: 4.1 10*3/uL (ref 1.7–7.7)
Neutrophils Relative %: 66 %
PLATELETS: 310 10*3/uL (ref 150–400)
RBC: 3.65 MIL/uL — ABNORMAL LOW (ref 3.87–5.11)
RDW: 15.5 % (ref 11.5–15.5)
WBC: 6.2 10*3/uL (ref 4.0–10.5)

## 2018-02-03 LAB — URINALYSIS, ROUTINE W REFLEX MICROSCOPIC
Bilirubin Urine: NEGATIVE
GLUCOSE, UA: NEGATIVE mg/dL
HGB URINE DIPSTICK: NEGATIVE
KETONES UR: 5 mg/dL — AB
Nitrite: NEGATIVE
PROTEIN: NEGATIVE mg/dL
Specific Gravity, Urine: 1.019 (ref 1.005–1.030)
pH: 7 (ref 5.0–8.0)

## 2018-02-03 LAB — ACETAMINOPHEN LEVEL: Acetaminophen (Tylenol), Serum: <10 ug/mL — ABNORMAL LOW (ref 10–30)

## 2018-02-03 LAB — ETHANOL

## 2018-02-03 LAB — SALICYLATE LEVEL

## 2018-02-03 MED ORDER — QUETIAPINE FUMARATE 25 MG PO TABS: 25.0000 mg | ORAL_TABLET | Freq: Every day | ORAL | Status: DC

## 2018-02-03 MED ORDER — PANTOPRAZOLE SODIUM 40 MG PO TBEC
40.0000 mg | DELAYED_RELEASE_TABLET | Freq: Every day | ORAL | Status: DC
  Administered 2018-02-03 – 2018-02-04 (×2): 40 mg via ORAL
  Filled 2018-02-03 (×2): qty 1

## 2018-02-03 MED ORDER — ARTIFICIAL TEARS OPHTHALMIC OINT: TOPICAL_OINTMENT | Freq: Four times a day (QID) | OPHTHALMIC | Status: DC | PRN

## 2018-02-03 MED ORDER — MELATONIN 5 MG PO TABS
10.0000 mg | ORAL_TABLET | Freq: Every day | ORAL | Status: DC
  Administered 2018-02-03: 10 mg via ORAL
  Filled 2018-02-03: qty 2

## 2018-02-03 MED ORDER — NORTRIPTYLINE HCL 25 MG PO CAPS
25.0000 mg | ORAL_CAPSULE | Freq: Every day | ORAL | Status: DC
  Filled 2018-02-03: qty 1

## 2018-02-03 MED ORDER — RISPERIDONE 2 MG PO TABS
2.0000 mg | ORAL_TABLET | Freq: Two times a day (BID) | ORAL | Status: DC
  Administered 2018-02-03: 2 mg via ORAL
  Filled 2018-02-03 (×2): qty 1

## 2018-02-03 MED ORDER — ATORVASTATIN CALCIUM 40 MG PO TABS
40.0000 mg | ORAL_TABLET | Freq: Every day | ORAL | Status: DC
  Administered 2018-02-03 – 2018-02-04 (×2): 40 mg via ORAL
  Filled 2018-02-03 (×2): qty 1

## 2018-02-03 MED ORDER — LINACLOTIDE 145 MCG PO CAPS
145.0000 ug | ORAL_CAPSULE | Freq: Every day | ORAL | Status: DC
  Administered 2018-02-04: 145 ug via ORAL
  Filled 2018-02-03: qty 1

## 2018-02-03 MED ORDER — ASPIRIN EC 81 MG PO TBEC
81.0000 mg | DELAYED_RELEASE_TABLET | Freq: Every day | ORAL | Status: DC
  Administered 2018-02-03 – 2018-02-04 (×2): 81 mg via ORAL
  Filled 2018-02-03 (×2): qty 1

## 2018-02-03 MED ORDER — DULOXETINE HCL 30 MG PO CPEP
60.0000 mg | ORAL_CAPSULE | Freq: Two times a day (BID) | ORAL | Status: DC
  Administered 2018-02-03 – 2018-02-04 (×3): 60 mg via ORAL
  Filled 2018-02-03 (×3): qty 2

## 2018-02-03 MED ORDER — MELATONIN 10 MG PO CAPS: 1.0000 | ORAL_CAPSULE | Freq: Every day | ORAL | Status: DC

## 2018-02-03 MED ORDER — SODIUM CHLORIDE (HYPERTONIC) 5 % OP OINT: 1.0000 "application " | TOPICAL_OINTMENT | Freq: Four times a day (QID) | OPHTHALMIC | Status: DC | PRN

## 2018-02-03 MED ORDER — HYDROXYZINE HCL 25 MG PO TABS
50.0000 mg | ORAL_TABLET | Freq: Every day | ORAL | Status: DC
  Administered 2018-02-03: 50 mg via ORAL
  Filled 2018-02-03: qty 2

## 2018-02-03 MED ORDER — TRAZODONE HCL 50 MG PO TABS: 50.0000 mg | ORAL_TABLET | Freq: Every day | ORAL | Status: DC

## 2018-02-03 NOTE — ED Notes (Signed)
[facility] Social Worker, Orlene Erm, visited [phone]) visited pt and provided information. Please see shadow chart.

## 2018-02-03 NOTE — ED Notes (Signed)
Pt reports hearing non-specific voices and that is the reason she came in. It just bothers her. She is calm and cooperative.

## 2018-02-03 NOTE — ED Notes (Signed)
Patient wanded by security. 
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
## 2018-02-03 NOTE — ED Notes (Signed)
Dr. Dorian Pod, pt's doctor with [facility], called ([phone]). Asked that we call to give her updates and/or get information.

## 2018-02-03 NOTE — ED Triage Notes (Signed)
Patient was brought in by [facility] bc she has been hearing voices in her head. The voices are not telling her to hurt herself but they are loud. Patient denies SI or HI.

## 2018-02-03 NOTE — ED Notes (Signed)
Report given to Rn  

## 2018-02-03 NOTE — Progress Notes (Signed)
Patient not suicidal/homicidal, no drug use, reports she does not hear voices but thinks the devil is attacking her at night.  Reports compliance to medications.  Vistaril 50 mg started and discontinued Trazodone as it can cause nightmares.  Patient is not responding to internal stimuli.  She may be experiencing hypnagogic or hypnopompic hallucinations.  Calmly watching television.  Has an ACT team, should be able to discharge home tomorrow.  Waylan Boga, PMHNP

## 2018-02-03 NOTE — ED Provider Notes (Signed)
[facility] DEPT Provider Note   CSN: [id number] Arrival date & time: 02/03/18  1146     History   Chief Complaint No chief complaint on file.   HPI Bridget [name] is a 62 y.o. female.  HPI   Bridget [name] is a 62 y.o. female, with a history of depression, GERD, fibromyalgia, and schizoaffective disorder, presenting to the ED for medical clearance prior to psychiatric evaluation. Sent from [place].  Patient complains of a "pressure" in her head, bilateral frontal, intermittent, but is distressing to her and something she has not experienced before. She is firm in saying, "It's not a headache, I just feel like something is wrong. Maybe it's stress. It feels like stress." "I feel confused like I can't get my thoughts together." States she has had these complaints for three weeks, but they have not been worsening.  Also endorses intermittent palpitations. These have been evaluated outpatient and she previously had normal TSH and BMP per documents from [facility]. Patient states she is stressed, "because I think about death a lot. I don't want to die, but I'm afraid of it." Patient states she doesn't know why she has become more worried about death lately. Denies SI/HI.  Admits to auditory hallucinations that have been present for 20 years, have not changed, and are not distressing to her.    Drug/alcohol use: Denies alcohol or illicit drug use  Medication compliance: Voices compliance with all medications  Medication changes: States she had previously been taken off of her risperidone 4 weeks ago, but was placed back on it about a week ago due to the stress she has been feeling.   Denies dizziness, pain, N/V/D, fever/chills, weakness, numbness, vision changes, CP, SOB, abdominal pain, falls/trauma, syncope, or any other complaints.   Past Medical History:  Diagnosis Date  . Anxiety   . Back pain    low back - arthritis  .  Barrett's esophagus   . Chronic headaches   . Constipation    miralax daily  . Depression   . Diabetes ([place])    per patient, MD took her off medication - no meds  . Dry mouth    biotene  . Fibromyalgia   . GERD (gastroesophageal reflux disease)   . Hyperlipemia   . Insomnia   . Myoclonus    patient states "no longer an issue"  . Osteoarthritis    lower back, shoulders, hands  . Schizoaffective disorder ([place])   . SVD (spontaneous vaginal delivery)    x 3  . Vertigo   . Wears dentures    full    There are no active problems to display for this patient.   Past Surgical History:  Procedure Laterality Date  . ABDOMINAL HYSTERECTOMY    . BREAST EXCISIONAL BIOPSY Right   . BREAST EXCISIONAL BIOPSY Right   . BREAST EXCISIONAL BIOPSY Right   . BREAST EXCISIONAL BIOPSY Right   . BUNIONECTOMY Bilateral   . COLONOSCOPY  08/2013  . HAND SURGERY Right    carpel tunnel surgery  . MULTIPLE TOOTH EXTRACTIONS     full dentures  . TRIGGER FINGER RELEASE Bilateral    thumbs    OB History    No data available       Home Medications    Prior to Admission medications   Medication Sig Start Date End Date Taking? Authorizing Provider  acetaminophen (TYLENOL 8 HOUR) 650 MG CR tablet Take 1,300 mg  by mouth daily as needed for pain.   Yes [provider]  antiseptic oral rinse (BIOTENE) LIQD 15 mLs by Mouth Rinse route 3 (three) times daily as needed.    Yes [provider]  aspirin EC 81 MG tablet Take 81 mg by mouth daily.   Yes [provider]  atorvastatin (LIPITOR) 40 MG tablet Take 40 mg by mouth daily.   Yes [provider]  DULoxetine (CYMBALTA) 60 MG capsule Take 60 mg by mouth 2 (two) times daily.   Yes [provider]  linaclotide (LINZESS) 145 MCG CAPS capsule Take 145 mcg by mouth daily before breakfast.   Yes [provider]  Melatonin 10 MG CAPS Take 1 capsule by mouth daily.   Yes [provider]    Menthol, Topical Analgesic, (BIOFREEZE) 4 % GEL Apply 1 application topically 2 (two) times daily as needed.   Yes [provider]  nortriptyline (PAMELOR) 50 MG capsule Take 25 mg by mouth at bedtime.    Yes [provider]  omeprazole (PRILOSEC) 20 MG capsule TAKE 1 CAPSULE (20 MG TOTAL) BY MOUTH DAILY AT 0600. 11/10/16  Yes [provider]  paliperidone (INVEGA SUSTENNA) 156 MG/ML SUSP injection Inject 156 mg into the muscle every 30 (thirty) days.   Yes [provider]  polyethylene glycol (MIRALAX / GLYCOLAX) packet Take 17 g by mouth daily as needed.   Yes [provider]  risperiDONE (RISPERDAL) 1 MG tablet Take 2 mg by mouth 2 (two) times daily.   Yes [provider]  sodium chloride (MURO 128) 5 % ophthalmic ointment Place 1 application into both eyes 4 (four) times daily as needed for eye irritation.   Yes [provider]  traZODone (DESYREL) 50 MG tablet Take 50 mg by mouth at bedtime.   Yes [provider]  bisacodyl (DULCOLAX) 5 MG EC tablet Take 1 tablet (5 mg total) by mouth daily as needed for moderate constipation. Patient not taking: Reported on 02/03/2018 12/24/17   Doran Stabler, MD    Family History Family History  Problem Relation Age of Onset  . Diabetes Mother   . Prostate cancer Father   . Colon cancer Neg Hx   . Colon polyps Neg Hx   . Rectal cancer Neg Hx   . Stomach cancer Neg Hx     Social History Social History   Tobacco Use  . Smoking status: Never Smoker  . Smokeless tobacco: Never Used  Substance Use Topics  . Alcohol use: No  . Drug use: No     Allergies   Amitriptyline; Flexeril [cyclobenzaprine]; and Penicillins   Review of Systems Review of Systems  Constitutional: Negative for chills and fever.  Eyes: Negative for visual disturbance.  Respiratory: Negative for cough and shortness of breath.   Cardiovascular: Negative for chest pain and leg swelling.   Gastrointestinal: Negative for abdominal pain, diarrhea, nausea and vomiting.  Neurological: Negative for dizziness, syncope, weakness, light-headedness, numbness and headaches.  Psychiatric/Behavioral: Positive for sleep disturbance. Negative for suicidal ideas. The patient is nervous/anxious.   All other systems reviewed and are negative.    Physical Exam Updated Vital Signs BP 136/76 (BP Location: Left Wrist)   Pulse 80   Temp 98.2 F (36.8 C) (Oral)   Resp 18   SpO2 99%   Physical Exam  Constitutional: She is oriented to person, place, and time. She appears well-developed and well-nourished. No distress.  HENT:  Head: Normocephalic and atraumatic.  Mouth/Throat: Oropharynx is clear and moist.  Eyes: Conjunctivae and EOM are normal. Pupils are equal, round, and reactive to light.  Neck: Normal range of motion. Neck supple.  Cardiovascular: Normal rate, regular rhythm, normal heart sounds and intact distal pulses.  Pulmonary/Chest: Effort normal and breath sounds normal. No respiratory distress.  Abdominal: Soft. There is no tenderness. There is no guarding.  Musculoskeletal: She exhibits no edema.  Lymphadenopathy:    She has no cervical adenopathy.  Neurological: She is alert and oriented to person, place, and time.  No sensory deficits.  No noted speech deficits. No aphasia. Patient handles oral secretions without difficulty. No noted swallowing defects.  Equal grip strength bilaterally. Strength 5/5 in the upper extremities. Strength 5/5 with flexion and extension of the hips, knees, and ankles bilaterally.  Negative Romberg. No gait disturbance.  Coordination intact including heel to shin and finger to nose.  Cranial nerves III-XII grossly intact.  No facial droop.   Skin: Skin is warm and dry. She is not diaphoretic.  Psychiatric: She has a normal mood and affect. Her behavior is normal.  Nursing note and vitals reviewed.    ED Treatments / Results  Labs (all  labs ordered are listed, but only abnormal results are displayed) Labs Reviewed  URINALYSIS, ROUTINE W REFLEX MICROSCOPIC - Abnormal; Notable for the following components:      Result Value   Ketones, ur 5 (*)    Leukocytes, UA TRACE (*)    Bacteria, UA RARE (*)    Squamous Epithelial / LPF 6-30 (*)    All other components within normal limits  COMPREHENSIVE METABOLIC PANEL - Abnormal; Notable for the following components:   Glucose, Bld 125 (*)    ALT 9 (*)    All other components within normal limits  CBC WITH DIFFERENTIAL/PLATELET - Abnormal; Notable for the following components:   RBC 3.65 (*)    Hemoglobin 10.7 (*)    HCT 33.3 (*)    All other components within normal limits  ACETAMINOPHEN LEVEL - Abnormal; Notable for the following components:   Acetaminophen (Tylenol), Serum <10 (*)    All other components within normal limits  ETHANOL  RAPID URINE DRUG SCREEN, HOSP PERFORMED  SALICYLATE LEVEL    EKG  EKG Interpretation  Date/Time:  Wednesday February 03 2018 14:10:21 EST Ventricular Rate:  84 PR Interval:  152 QRS Duration: 84 QT Interval:  352 QTC Calculation: 415 R Axis:   33 Text Interpretation:  Normal sinus rhythm Normal ECG No old tracing to compare Confirmed by Daleen Bo [phone]) on 02/03/2018 4:21:07 PM       Radiology Dg Chest 2 View  Result Date: 02/03/2018 CLINICAL DATA:  Shortness of breath. EXAM: CHEST  2 VIEW COMPARISON:  Radiographs of December 04, 2017. FINDINGS: The heart size and mediastinal contours are within normal limits. Both lungs are clear. No pneumothorax or pleural effusion is noted. The visualized skeletal structures are unremarkable. IMPRESSION: No active cardiopulmonary disease. Electronically Signed   By: Marijo Conception, M.D.   On: 02/03/2018 14:05   Ct Head Wo Contrast  Result Date: 02/03/2018 CLINICAL DATA:  Head pressure. Hallucinations. History of diabetes. EXAM: CT HEAD WITHOUT CONTRAST TECHNIQUE: Contiguous axial images  were obtained from the base of the skull through the vertex without intravenous contrast. COMPARISON:  CT 11/27/2016. FINDINGS: Brain: There is no evidence of acute intracranial hemorrhage, mass lesion, brain edema or extra-axial fluid collection. The ventricles and subarachnoid spaces are appropriately sized for age.  There is no CT evidence of acute cortical infarction. There is patchy low-density in the periventricular white matter with an asymmetric component in the anterior limb of the left internal capsule (image 14), similar to previous study. Vascular:  No hyperdense vessel identified. Skull: Negative for fracture or focal lesion. Sinuses/Orbits: The visualized paranasal sinuses and mastoid air cells are clear. No orbital abnormalities are seen. Other: None. IMPRESSION: No acute intracranial findings. Mild periventricular white matter disease, likely due to chronic small vessel ischemic changes, similar to previous study. Electronically Signed   By: Richardean Sale M.D.   On: 02/03/2018 14:12    Procedures Procedures (including critical care time)  Medications Ordered in ED Medications  aspirin EC tablet 81 mg (81 mg Oral Given 02/03/18 1639)  atorvastatin (LIPITOR) tablet 40 mg (40 mg Oral Given 02/03/18 1638)  DULoxetine (CYMBALTA) DR capsule 60 mg (60 mg Oral Given 02/03/18 1637)  linaclotide (LINZESS) capsule 145 mcg (not administered)  pantoprazole (PROTONIX) EC tablet 40 mg (40 mg Oral Given 02/03/18 1638)  nortriptyline (PAMELOR) capsule 25 mg (not administered)  risperiDONE (RISPERDAL) tablet 2 mg (2 mg Oral Given 02/03/18 1638)  hydrOXYzine (ATARAX/VISTARIL) tablet 50 mg (not administered)  Melatonin TABS 10 mg (not administered)  artificial tears (LACRILUBE) ophthalmic ointment (not administered)     Initial Impression / Assessment and Plan / ED Course  I have reviewed the triage vital signs and the nursing notes.  Pertinent labs & imaging results that were available during my  care of the patient were reviewed by me and considered in my medical decision making (see chart for details).  Clinical Course as of Feb 03 1646  Wed Feb 03, 2018  1644 Value consistent with previous values found in Care Everywhere. Hemoglobin: (!) 10.7 [SJ]    Clinical Course User Index [SJ] Maleka Contino C, PA-C    Patient presents for medical clearance prior to psychiatric evaluation. Lab values are encouraging.  No acute abnormality on CT.  Patient medically cleared.  Placed in psych hold awaiting psychiatric evaluation. Home medications ordered.    Final Clinical Impressions(s) / ED Diagnoses   Final diagnoses:  Auditory hallucinations    ED Discharge Orders    None       Layla Maw 02/03/18 1647    Davonna Belling, MD 02/05/18 [phone]

## 2018-02-04 DIAGNOSIS — F250 Schizoaffective disorder, bipolar type: Secondary | ICD-10-CM | POA: Diagnosis present

## 2018-02-04 HISTORY — DX: Schizoaffective disorder, bipolar type: F25.0

## 2018-02-04 MED ORDER — ACETAMINOPHEN 325 MG PO TABS: 650.0000 mg | ORAL_TABLET | Freq: Once | ORAL | Status: DC

## 2018-02-04 MED ORDER — HYDROXYZINE HCL 50 MG PO TABS: 50.0000 mg | ORAL_TABLET | Freq: Every day | ORAL | 0 refills | Status: DC

## 2018-02-04 NOTE — Consult Note (Addendum)
[place] [facility] Consult   Reason for Consult:  Delusions at night Referring Physician:  EDP Patient Identification: Bridget [name] MRN:  [id number] Principal Diagnosis: Schizoaffective disorder, bipolar type [facility]) Diagnosis:   Patient Active Problem List   Diagnosis Date Noted  . Schizoaffective disorder, bipolar type ([place]) [F25.0] 02/04/2018    Priority: High    Total Time spent with patient: 1 hour  Subjective:   Bridget [name] is a 62 y.o. female patient does not warrant admission.  HPI:  62 yo female who presented to the ED with delusions the devil was attacking her at night.  Denies hallucinations, suicidal/homicidal ideations, and substance abuse.  Her [facility] team reported she just moved into her own place.  It appears she is having some adjustment issues with this.  Her Trazodone was discontinued as a side effect is nightmares, started Vistaril 50 mg at bedtime instead.  She slept last night with no delusions or nightmares.  Stable to return home.  Past Psychiatric History: schizoaffective disorder  Risk to Self: Suicidal Ideation: No Suicidal Intent: No Is patient at risk for suicide?: No Suicidal Plan?: No Access to Means: No What has been your use of drugs/alcohol within the last 12 months?: Denied Intentional Self Injurious Behavior: None Risk to Others: Homicidal Ideation: No Thoughts of Harm to Others: No Current Homicidal Intent: No Current Homicidal Plan: No Access to Homicidal Means: No History of harm to others?: No Assessment of Violence: None Noted Does patient have access to weapons?: No Criminal Charges Pending?: No Does patient have a court date: No Prior Inpatient Therapy: Prior Inpatient Therapy: No Prior Outpatient Therapy: Prior Outpatient Therapy: Yes Prior Therapy Dates: (Cannot recall) Prior Therapy Facilty/Provider(s): (Cannot recall) Does patient have an ACCT team?: No Does patient have Intensive In-House Services?  : No Does  patient have [facility] services? : No Does patient have P4CC services?: No  Past Medical History:  Past Medical History:  Diagnosis Date  . Anxiety   . Back pain    low back - arthritis  . Barrett's esophagus   . Chronic headaches   . Constipation    miralax daily  . Depression   . Diabetes ([place])    per patient, MD took her off medication - no meds  . Dry mouth    biotene  . Fibromyalgia   . GERD (gastroesophageal reflux disease)   . Hyperlipemia   . Insomnia   . Myoclonus    patient states "no longer an issue"  . Osteoarthritis    lower back, shoulders, hands  . Schizoaffective disorder ([place])   . SVD (spontaneous vaginal delivery)    x 3  . Vertigo   . Wears dentures    full    Past Surgical History:  Procedure Laterality Date  . ABDOMINAL HYSTERECTOMY    . BREAST EXCISIONAL BIOPSY Right   . BREAST EXCISIONAL BIOPSY Right   . BREAST EXCISIONAL BIOPSY Right   . BREAST EXCISIONAL BIOPSY Right   . BUNIONECTOMY Bilateral   . COLONOSCOPY  08/2013  . HAND SURGERY Right    carpel tunnel surgery  . MULTIPLE TOOTH EXTRACTIONS     full dentures  . TRIGGER FINGER RELEASE Bilateral    thumbs   Family History:  Family History  Problem Relation Age of Onset  . Diabetes Mother   . Prostate cancer Father   . Colon cancer Neg Hx   . Colon polyps Neg Hx   . Rectal cancer Neg Hx   .  Stomach cancer Neg Hx    Family Psychiatric  History: none Social History:  Social History   Substance and Sexual Activity  Alcohol Use No     Social History   Substance and Sexual Activity  Drug Use No    Social History   Socioeconomic History  . Marital status: Married    Spouse name: None  . Number of children: None  . Years of education: None  . Highest education level: None  Social Needs  . Financial resource strain: None  . Food insecurity - worry: None  . Food insecurity - inability: None  . Transportation needs - medical: None  . Transportation needs - non-medical:  None  Occupational History  . None  Tobacco Use  . Smoking status: Never Smoker  . Smokeless tobacco: Never Used  Substance and Sexual Activity  . Alcohol use: No  . Drug use: No  . Sexual activity: No    Birth control/protection: Post-menopausal    Comment: Hysterectomy  Other Topics Concern  . None  Social History Narrative  . None   Additional Social History: N/A    Allergies:   Allergies  Allergen Reactions  . Amitriptyline Other (See Comments)    Urinary retention  . Flexeril [Cyclobenzaprine]     Urinary retention  . Penicillins Rash    Has patient had a PCN reaction causing immediate rash, facial/tongue/throat swelling, SOB or lightheadedness with hypotension: yes Has patient had a PCN reaction causing severe rash involving mucus membranes or skin necrosis: no Has patient had a PCN reaction that required hospitalization: No Has patient had a PCN reaction occurring within the last 10 years: No If all of the above answers are "NO", then may proceed with Cephalosporin use.     Labs:  Results for orders placed or performed during the hospital encounter of 02/03/18 (from the past 48 hour(s))  Comprehensive metabolic panel     Status: Abnormal   Collection Time: 02/03/18  1:19 PM  Result Value Ref Range   Sodium 139 135 - 145 mmol/L   Potassium 3.5 3.5 - 5.1 mmol/L   Chloride 103 101 - 111 mmol/L   CO2 26 22 - 32 mmol/L   Glucose, Bld 125 (H) 65 - 99 mg/dL   BUN 9 6 - 20 mg/dL   Creatinine, Ser 0.85 0.44 - 1.00 mg/dL   Calcium 9.3 8.9 - 10.3 mg/dL   Total Protein 7.8 6.5 - 8.1 g/dL   Albumin 3.8 3.5 - 5.0 g/dL   AST 17 15 - 41 U/L   ALT 9 (L) 14 - 54 U/L   Alkaline Phosphatase 49 38 - 126 U/L   Total Bilirubin 0.6 0.3 - 1.2 mg/dL   GFR calc non Af Amer >60 >60 mL/min   GFR calc Af Amer >60 >60 mL/min    Comment: (NOTE) The eGFR has been calculated using the CKD EPI equation. This calculation has not been validated in all clinical situations. eGFR's  persistently <60 mL/min signify possible Chronic Kidney Disease.    Anion gap 10 5 - 15    Comment: Performed at [facility], [place] [address], [place], [place] [address]  Ethanol     Status: None   Collection Time: 02/03/18  1:19 PM  Result Value Ref Range   Alcohol, Ethyl (B) <10 <10 mg/dL    Comment:        LOWEST DETECTABLE LIMIT FOR SERUM ALCOHOL IS 10 mg/dL FOR MEDICAL PURPOSES ONLY Performed at [facility]  [place] [address] [place], [place] [address]   CBC with Diff     Status: Abnormal   Collection Time: 02/03/18  1:19 PM  Result Value Ref Range   WBC 6.2 4.0 - 10.5 K/uL   RBC 3.65 (L) 3.87 - 5.11 MIL/uL   Hemoglobin 10.7 (L) 12.0 - 15.0 g/dL   HCT 33.3 (L) 36.0 - 46.0 %   MCV 91.2 78.0 - 100.0 fL   MCH 29.3 26.0 - 34.0 pg   MCHC 32.1 30.0 - 36.0 g/dL   RDW 15.5 11.5 - 15.5 %   Platelets 310 150 - 400 K/uL   Neutrophils Relative % 66 %   Neutro Abs 4.1 1.7 - 7.7 K/uL   Lymphocytes Relative 27 %   Lymphs Abs 1.7 0.7 - 4.0 K/uL   Monocytes Relative 6 %   Monocytes Absolute 0.4 0.1 - 1.0 K/uL   Eosinophils Relative 1 %   Eosinophils Absolute 0.1 0.0 - 0.7 K/uL   Basophils Relative 0 %   Basophils Absolute 0.0 0.0 - 0.1 K/uL    Comment: Performed at [facility], [place] [address] [place], [place] [address]  Salicylate level     Status: None   Collection Time: 02/03/18  1:19 PM  Result Value Ref Range   Salicylate Lvl <0.9 2.8 - 30.0 mg/dL    Comment: Performed at [facility], [place] [address] [place], [place] [address]  Acetaminophen level     Status: Abnormal   Collection Time: 02/03/18  1:19 PM  Result Value Ref Range   Acetaminophen (Tylenol), Serum <10 (L) 10 - 30 ug/mL    Comment:        THERAPEUTIC CONCENTRATIONS VARY SIGNIFICANTLY. A RANGE OF 10-30 ug/mL MAY BE AN EFFECTIVE CONCENTRATION FOR MANY PATIENTS. HOWEVER, SOME ARE BEST TREATED AT CONCENTRATIONS OUTSIDE  THIS RANGE. ACETAMINOPHEN CONCENTRATIONS >150 ug/mL AT 4 HOURS AFTER INGESTION AND >50 ug/mL AT 12 HOURS AFTER INGESTION ARE OFTEN ASSOCIATED WITH TOXIC REACTIONS. Performed at [facility], [place] [address], [place], [place] [address]   Urinalysis, Routine w reflex microscopic     Status: Abnormal   Collection Time: 02/03/18  2:21 PM  Result Value Ref Range   Color, Urine YELLOW YELLOW   APPearance CLEAR CLEAR   Specific Gravity, Urine 1.019 1.005 - 1.030   pH 7.0 5.0 - 8.0   Glucose, UA NEGATIVE NEGATIVE mg/dL   Hgb urine dipstick NEGATIVE NEGATIVE   Bilirubin Urine NEGATIVE NEGATIVE   Ketones, ur 5 (A) NEGATIVE mg/dL   Protein, ur NEGATIVE NEGATIVE mg/dL   Nitrite NEGATIVE NEGATIVE   Leukocytes, UA TRACE (A) NEGATIVE   RBC / HPF 0-5 0 - 5 RBC/hpf   WBC, UA 0-5 0 - 5 WBC/hpf   Bacteria, UA RARE (A) NONE SEEN   Squamous Epithelial / LPF 6-30 (A) NONE SEEN   Mucus PRESENT     Comment: Performed at [facility], [place] [address] [place], [place] [address]  Urine rapid drug screen (hosp performed)     Status: None   Collection Time: 02/03/18  2:21 PM  Result Value Ref Range   Opiates NONE DETECTED NONE DETECTED   Cocaine NONE DETECTED NONE DETECTED   Benzodiazepines NONE DETECTED NONE DETECTED   Amphetamines NONE DETECTED NONE DETECTED   Tetrahydrocannabinol NONE DETECTED NONE DETECTED   Barbiturates NONE DETECTED NONE DETECTED    Comment: (NOTE) DRUG SCREEN FOR MEDICAL PURPOSES ONLY.  IF CONFIRMATION IS  NEEDED FOR ANY PURPOSE, NOTIFY LAB WITHIN 5 DAYS. LOWEST DETECTABLE LIMITS FOR URINE DRUG SCREEN Drug Class                     Cutoff (ng/mL) Amphetamine and metabolites    1000 Barbiturate and metabolites    200 Benzodiazepine                 756 Tricyclics and metabolites     300 Opiates and metabolites        300 Cocaine and metabolites        300 THC                            50 Performed at [facility],  [place] [address], [place], [place] [address]     Current Facility-Administered Medications  Medication Dose Route Frequency Provider Last Rate Last Dose  . 0.9 %  sodium chloride infusion  500 mL Intravenous Once Nelida Meuse III, MD      . acetaminophen (TYLENOL) tablet 650 mg  650 mg Oral Once Davonna Belling, MD   Stopped at 02/04/18 0133  . artificial tears (LACRILUBE) ophthalmic ointment   Both Eyes QID PRN Davonna Belling, MD      . aspirin EC tablet 81 mg  81 mg Oral Daily Joy, Shawn C, PA-C   81 mg at 02/04/18 0942  . atorvastatin (LIPITOR) tablet 40 mg  40 mg Oral Daily Joy, Shawn C, PA-C   40 mg at 02/04/18 0942  . DULoxetine (CYMBALTA) DR capsule 60 mg  60 mg Oral BID Joy, Shawn C, PA-C   60 mg at 02/04/18 5188  . hydrOXYzine (ATARAX/VISTARIL) tablet 50 mg  50 mg Oral QHS Patrecia Pour, NP   50 mg at 02/03/18 2140  . linaclotide (LINZESS) capsule 145 mcg  145 mcg Oral QAC breakfast Arlean Hopping C, PA-C   145 mcg at 02/04/18 0743  . Melatonin TABS 10 mg  10 mg Oral Lowanda Foster, MD   10 mg at 02/03/18 2156  . nortriptyline (PAMELOR) capsule 25 mg  25 mg Oral QHS Joy, Shawn C, PA-C      . pantoprazole (PROTONIX) EC tablet 40 mg  40 mg Oral Daily Joy, Shawn C, PA-C   40 mg at 02/04/18 0942  . risperiDONE (RISPERDAL) tablet 2 mg  2 mg Oral BID Joy, Shawn C, PA-C   2 mg at 02/03/18 1638   Current Outpatient Medications  Medication Sig Dispense Refill  . acetaminophen (TYLENOL 8 HOUR) 650 MG CR tablet Take 1,300 mg by mouth daily as needed for pain.    Marland Kitchen antiseptic oral rinse (BIOTENE) LIQD 15 mLs by Mouth Rinse route 3 (three) times daily as needed.     Marland Kitchen aspirin EC 81 MG tablet Take 81 mg by mouth daily.    Marland Kitchen atorvastatin (LIPITOR) 40 MG tablet Take 40 mg by mouth daily.    . DULoxetine (CYMBALTA) 60 MG capsule Take 60 mg by mouth 2 (two) times daily.    Marland Kitchen linaclotide (LINZESS) 145 MCG CAPS capsule Take 145 mcg by mouth daily before breakfast.    . Melatonin 10 MG  CAPS Take 1 capsule by mouth daily.    . Menthol, Topical Analgesic, (BIOFREEZE) 4 % GEL Apply 1 application topically 2 (two) times daily as needed.    . nortriptyline (PAMELOR) 50 MG capsule Take 25 mg by mouth at bedtime.     Marland Kitchen  omeprazole (PRILOSEC) 20 MG capsule TAKE 1 CAPSULE (20 MG TOTAL) BY MOUTH DAILY AT 0600.    . paliperidone (INVEGA SUSTENNA) 156 MG/ML SUSP injection Inject 156 mg into the muscle every 30 (thirty) days.    . polyethylene glycol (MIRALAX / GLYCOLAX) packet Take 17 g by mouth daily as needed.    . risperiDONE (RISPERDAL) 1 MG tablet Take 2 mg by mouth 2 (two) times daily.    . sodium chloride (MURO 128) 5 % ophthalmic ointment Place 1 application into both eyes 4 (four) times daily as needed for eye irritation.    . traZODone (DESYREL) 50 MG tablet Take 50 mg by mouth at bedtime.    . bisacodyl (DULCOLAX) 5 MG EC tablet Take 1 tablet (5 mg total) by mouth daily as needed for moderate constipation. (Patient not taking: Reported on 02/03/2018) 4 tablet 0    Musculoskeletal: Strength & Muscle Tone: within normal limits Gait & Station: normal Patient leans: N/A  Psychiatric Specialty Exam: Physical Exam  Nursing note and vitals reviewed. Constitutional: She is oriented to person, place, and time. She appears well-developed and well-nourished.  HENT:  Head: Normocephalic and atraumatic.  Neck: Normal range of motion.  Respiratory: Effort normal.  Musculoskeletal: Normal range of motion.  Neurological: She is alert and oriented to person, place, and time.  Psychiatric: She has a normal mood and affect. Her speech is normal and behavior is normal. Judgment and thought content normal. Cognition and memory are normal.    Review of Systems  Psychiatric/Behavioral: Negative for hallucinations.  All other systems reviewed and are negative.   Blood pressure 133/87, pulse 82, temperature 98.2 F (36.8 C), temperature source Oral, resp. rate 18, SpO2 99 %.There is no  height or weight on file to calculate BMI.  General Appearance: Casual  Eye Contact:  Good  Speech:  Normal Rate  Volume:  Normal  Mood:  Euthymic  Affect:  Congruent  Thought Process:  Coherent and Descriptions of Associations: Intact  Orientation:  Full (Time, Place, and Person)  Thought Content:  WDL and Logical  Suicidal Thoughts:  No  Homicidal Thoughts:  No  Memory:  Immediate;   Good Recent;   Good Remote;   Good  Judgement:  Fair  Insight:  Good  Psychomotor Activity:  Normal  Concentration:  Concentration: Good and Attention Span: Good  Recall:  Good  Fund of Knowledge:  Good  Language:  Good  Akathisia:  No  Handed:  Right  AIMS (if indicated):   N/A  Assets:  Housing Leisure Time Physical Health Resilience Social Support  ADL's:  Intact  Cognition:  WNL  Sleep:   N/A     Treatment Plan Summary: Daily contact with patient to assess and evaluate symptoms and progress in treatment, Medication management and Plan schizoaffective disorder, bipolar type:  -Crisis stabilization -Medication management:  Continued medical medications along with Risperdal 2 mg BID for mood stabilization, Nortriptyline 25 mg at bedtime for sleep, Cymbalta 60 mg BID for depression; stopped her Trazodone and started Vistaril 50 mg at bedtime for sleep. -Individual counseling  Disposition: No evidence of imminent risk to self or others at present.    Waylan Boga, NP 02/04/2018 10:55 AM   Patient seen face-to-face for psychiatric evaluation, chart reviewed and case discussed with the physician extender and developed treatment plan. Reviewed the information documented and agree with the treatment plan.  Buford Dresser, DO 02/04/18 7:58 PM

## 2018-02-04 NOTE — BH Assessment (Signed)
[facility] Assessment Progress Note  Per Buford Dresser, DO, this pt does not require psychiatric hospitalization at this time.  Pt is to be discharged from [facility] with recommendation to continue treatment with [facility].  This has been included in pt's discharge instructions.  Pt's nurse has been notified.  Jalene Mullet, [place] Triage Specialist [phone]

## 2018-02-04 NOTE — BHH Suicide Risk Assessment (Signed)
Suicide Risk Assessment  Discharge Assessment   [facility] Discharge Suicide Risk Assessment   Principal Problem: Schizoaffective disorder, bipolar type [facility]) Discharge Diagnoses:  Patient Active Problem List   Diagnosis Date Noted  . Schizoaffective disorder, bipolar type ([place]) [F25.0] 02/04/2018    Priority: High    Total Time spent with patient: 45 minutes  Musculoskeletal: Strength & Muscle Tone: within normal limits Gait & Station: normal Patient leans: N/A  Psychiatric Specialty Exam: Physical Exam  Constitutional: She is oriented to person, place, and time. She appears well-developed and well-nourished.  HENT:  Head: Normocephalic.  Neck: Normal range of motion.  Respiratory: Effort normal.  Musculoskeletal: Normal range of motion.  Neurological: She is alert and oriented to person, place, and time.  Psychiatric: She has a normal mood and affect. Her speech is normal and behavior is normal. Judgment and thought content normal. Cognition and memory are normal.    Review of Systems  All other systems reviewed and are negative.   Blood pressure 133/87, pulse 82, temperature 98.2 F (36.8 C), temperature source Oral, resp. rate 18, SpO2 99 %.There is no height or weight on file to calculate BMI.  General Appearance: Casual  Eye Contact:  Good  Speech:  Normal Rate  Volume:  Normal  Mood:  Euthymic  Affect:  Congruent  Thought Process:  Coherent and Descriptions of Associations: Intact  Orientation:  Full (Time, Place, and Person)  Thought Content:  WDL and Logical  Suicidal Thoughts:  No  Homicidal Thoughts:  No  Memory:  Immediate;   Good Recent;   Good Remote;   Good  Judgement:  Fair  Insight:  Good  Psychomotor Activity:  Normal  Concentration:  Concentration: Good and Attention Span: Good  Recall:  Good  Fund of Knowledge:  Good  Language:  Good  Akathisia:  No  Handed:  Right  AIMS (if indicated):     Assets:  Housing Leisure Time Physical  Health Resilience Social Support  ADL's:  Intact  Cognition:  WNL  Sleep:       Mental Status Per Nursing Assessment::   On Admission:   delusions   Demographic Factors:  Living alone  Loss Factors: NA  Historical Factors: NA  Risk Reduction Factors:   Sense of responsibility to family, Living with another person, especially a relative, Positive social support and Positive therapeutic relationship  Continued Clinical Symptoms:  None  Cognitive Features That Contribute To Risk:  None    Suicide Risk:  Minimal: No identifiable suicidal ideation.  Patients presenting with no risk factors but with morbid ruminations; may be classified as minimal risk based on the severity of the depressive symptoms    Plan Of Care/Follow-up recommendations:  Activity:  as tolerated Diet:  heart healhty diet  Mauricio Dahlen, NP 02/04/2018, 11:00 AM

## 2018-02-04 NOTE — ED Notes (Signed)
RN Jannette Fogo from [facility] visiting with pt, she reports pt is well known to them she receives their services 3 times a week and has an appointment set up with a new psychiatrist, Dr Salome Arnt on Tuesday 02/26 at [facility]. Per RN pt lives alone, for the first time ever, and is very afraid because the voices she is hearing, even though, she is used to them, are loudest at night and she is fearful of devil. RN is asking to call [facility] when pt is ready for discharge to arrange for transportation.

## 2018-02-04 NOTE — Discharge Instructions (Signed)
For your behavioral health needs, you are advised to continue treatment with [facility]:       [facility]      [address]      [place], [place] [address]      [phone]

## 2018-04-13 ENCOUNTER — Other Ambulatory Visit: Payer: Self-pay | Admitting: Nurse Practitioner

## 2018-04-13 DIAGNOSIS — Z1231 Encounter for screening mammogram for malignant neoplasm of breast: Secondary | ICD-10-CM

## 2018-05-11 ENCOUNTER — Ambulatory Visit
Admission: RE | Admit: 2018-05-11 | Discharge: 2018-05-11 | Disposition: A | Discharge status: Home / Self Care | Payer: Medicare (Managed Care) | Source: Ambulatory Visit | Attending: Nurse Practitioner | Admitting: Nurse Practitioner

## 2018-05-11 DIAGNOSIS — Z1231 Encounter for screening mammogram for malignant neoplasm of breast: Secondary | ICD-10-CM

## 2018-06-01 ENCOUNTER — Ambulatory Visit (INDEPENDENT_AMBULATORY_CARE_PROVIDER_SITE_OTHER): Payer: Medicare (Managed Care) | Admitting: Psychology

## 2018-06-01 DIAGNOSIS — F250 Schizoaffective disorder, bipolar type: Secondary | ICD-10-CM | POA: Diagnosis not present

## 2018-07-15 ENCOUNTER — Ambulatory Visit (INDEPENDENT_AMBULATORY_CARE_PROVIDER_SITE_OTHER): Payer: Medicare (Managed Care) | Admitting: Psychology

## 2018-07-15 DIAGNOSIS — F250 Schizoaffective disorder, bipolar type: Secondary | ICD-10-CM | POA: Diagnosis not present

## 2018-07-29 ENCOUNTER — Ambulatory Visit: Payer: Self-pay | Admitting: Psychology

## 2018-08-12 ENCOUNTER — Ambulatory Visit (INDEPENDENT_AMBULATORY_CARE_PROVIDER_SITE_OTHER): Payer: Medicare (Managed Care) | Admitting: Psychology

## 2018-08-12 DIAGNOSIS — F250 Schizoaffective disorder, bipolar type: Secondary | ICD-10-CM

## 2018-08-26 ENCOUNTER — Ambulatory Visit (INDEPENDENT_AMBULATORY_CARE_PROVIDER_SITE_OTHER): Payer: Medicare (Managed Care) | Admitting: Psychology

## 2018-08-26 DIAGNOSIS — F250 Schizoaffective disorder, bipolar type: Secondary | ICD-10-CM

## 2019-06-02 ENCOUNTER — Other Ambulatory Visit: Payer: Self-pay | Admitting: Internal Medicine

## 2019-06-02 DIAGNOSIS — Z1231 Encounter for screening mammogram for malignant neoplasm of breast: Secondary | ICD-10-CM

## 2019-07-21 ENCOUNTER — Ambulatory Visit: Payer: Medicare (Managed Care)

## 2019-11-24 ENCOUNTER — Ambulatory Visit: Payer: Medicare (Managed Care)

## 2020-01-23 ENCOUNTER — Emergency Department (HOSPITAL_COMMUNITY)
Admission: EM | Admit: 2020-01-23 | Discharge: 2020-01-24 | Disposition: A | Discharge status: Home / Self Care | Payer: Medicare (Managed Care) | Attending: Emergency Medicine | Admitting: Emergency Medicine

## 2020-01-23 ENCOUNTER — Encounter (HOSPITAL_COMMUNITY): Payer: Self-pay

## 2020-01-23 ENCOUNTER — Other Ambulatory Visit: Payer: Self-pay

## 2020-01-23 DIAGNOSIS — F259 Schizoaffective disorder, unspecified: Secondary | ICD-10-CM | POA: Insufficient documentation

## 2020-01-23 DIAGNOSIS — E119 Type 2 diabetes mellitus without complications: Secondary | ICD-10-CM | POA: Diagnosis not present

## 2020-01-23 DIAGNOSIS — F250 Schizoaffective disorder, bipolar type: Secondary | ICD-10-CM | POA: Diagnosis present

## 2020-01-23 DIAGNOSIS — F419 Anxiety disorder, unspecified: Secondary | ICD-10-CM | POA: Insufficient documentation

## 2020-01-23 DIAGNOSIS — Z20822 Contact with and (suspected) exposure to covid-19: Secondary | ICD-10-CM | POA: Insufficient documentation

## 2020-01-23 DIAGNOSIS — Z79899 Other long term (current) drug therapy: Secondary | ICD-10-CM | POA: Insufficient documentation

## 2020-01-23 DIAGNOSIS — F411 Generalized anxiety disorder: Secondary | ICD-10-CM

## 2020-01-23 DIAGNOSIS — Z7982 Long term (current) use of aspirin: Secondary | ICD-10-CM | POA: Diagnosis not present

## 2020-01-23 LAB — COMPREHENSIVE METABOLIC PANEL
ALT: 22 U/L (ref 0–44)
AST: 24 U/L (ref 15–41)
Albumin: 3.9 g/dL (ref 3.5–5.0)
Alkaline Phosphatase: 43 U/L (ref 38–126)
Anion gap: 10 (ref 5–15)
BUN: 7 mg/dL — ABNORMAL LOW (ref 8–23)
CO2: 27 mmol/L (ref 22–32)
Calcium: 9.4 mg/dL (ref 8.9–10.3)
Chloride: 104 mmol/L (ref 98–111)
Creatinine, Ser: 0.83 mg/dL (ref 0.44–1.00)
GFR calc Af Amer: >60 mL/min (ref >60)
GFR calc non Af Amer: >60 mL/min (ref >60)
Glucose, Bld: 117 mg/dL — ABNORMAL HIGH (ref 70–99)
Potassium: 3.5 mmol/L (ref 3.5–5.1)
Sodium: 141 mmol/L (ref 135–145)
Total Bilirubin: 0.5 mg/dL (ref 0.3–1.2)
Total Protein: 8 g/dL (ref 6.5–8.1)

## 2020-01-23 LAB — RAPID URINE DRUG SCREEN, HOSP PERFORMED
Amphetamines: NOT DETECTED
Barbiturates: NOT DETECTED
Benzodiazepines: NOT DETECTED
Cocaine: NOT DETECTED
Opiates: NOT DETECTED
Tetrahydrocannabinol: NOT DETECTED

## 2020-01-23 LAB — RESPIRATORY PANEL BY RT PCR (FLU A&B, COVID)
Influenza A by PCR: NEGATIVE
Influenza B by PCR: NEGATIVE
SARS Coronavirus 2 by RT PCR: NEGATIVE

## 2020-01-23 LAB — ETHANOL: Alcohol, Ethyl (B): <10 mg/dL (ref <10)

## 2020-01-23 MED ORDER — ACETAMINOPHEN 325 MG PO TABS
650.0000 mg | ORAL_TABLET | Freq: Once | ORAL | Status: AC
  Administered 2020-01-23: 21:00:00 650 mg via ORAL
  Filled 2020-01-23: qty 2

## 2020-01-23 MED ORDER — ALUM & MAG HYDROXIDE-SIMETH 200-200-20 MG/5ML PO SUSP
30.0000 mL | Freq: Once | ORAL | Status: AC
  Administered 2020-01-23: 21:00:00 30 mL via ORAL
  Filled 2020-01-23: qty 30

## 2020-01-23 NOTE — ED Provider Notes (Signed)
[facility] DEPT Provider Note   CSN: [id number] Arrival date & time: 01/23/20  1412     History Chief Complaint  Patient presents with  . Anxiety    Bridget [name] is a 64 y.o. female with past medical history significant for schizoaffective disorder, depression, anxiety presenting to emergency department today with chief complaint of anxiety x3 weeks.  Patient states she has taken medications for anxiety in the past but is not anything currently.  She states she has taken Vistaril but I will go she stopped taking it.  She states her primary care doctor told her to stop taking the Vistaril but did not recommend another medication. She is also reporting a mild headache.  She states it started today.  She states is progressively worsened since onset.  It feels like headache she has had in the past. Pain is located throughout her head.  She denies any associated neck pain. Denies fever, syncope, head trauma, photophobia, phonophobia, UL throbbing, N/V, visual changes, stiff neck, neck pain, rash, or "thunderclap" onset. She denies any suicidal or homicidal ideations.   History provided by patient with additional history obtained from chart review.     Past Medical History:  Diagnosis Date  . Anxiety   . Back pain    low back - arthritis  . Barrett's esophagus   . Chronic headaches   . Constipation    miralax daily  . Depression   . Diabetes ([place])    per patient, MD took her off medication - no meds  . Dry mouth    biotene  . Fibromyalgia   . GERD (gastroesophageal reflux disease)   . Hyperlipemia   . Insomnia   . Myoclonus    patient states "no longer an issue"  . Osteoarthritis    lower back, shoulders, hands  . Schizoaffective disorder ([place])   . SVD (spontaneous vaginal delivery)    x 3  . Vertigo   . Wears dentures    full    Patient Active Problem List   Diagnosis Date Noted  . Schizoaffective disorder, bipolar type ([place])  02/04/2018    Past Surgical History:  Procedure Laterality Date  . ABDOMINAL HYSTERECTOMY    . BREAST EXCISIONAL BIOPSY Right   . BREAST EXCISIONAL BIOPSY Right   . BREAST EXCISIONAL BIOPSY Right   . BREAST EXCISIONAL BIOPSY Right   . BUNIONECTOMY Bilateral   . COLONOSCOPY  08/2013  . HAND SURGERY Right    carpel tunnel surgery  . MULTIPLE TOOTH EXTRACTIONS     full dentures  . TRIGGER FINGER RELEASE Bilateral    thumbs     OB History   No obstetric history on file.     Family History  Problem Relation Age of Onset  . Diabetes Mother   . Prostate cancer Father   . Colon cancer Neg Hx   . Colon polyps Neg Hx   . Rectal cancer Neg Hx   . Stomach cancer Neg Hx     Social History   Tobacco Use  . Smoking status: Never Smoker  . Smokeless tobacco: Never Used  Substance Use Topics  . Alcohol use: No  . Drug use: No    Home Medications Prior to Admission medications   Medication Sig Start Date End Date Taking? Authorizing Provider  acetaminophen (TYLENOL 8 HOUR) 650 MG CR tablet Take 1,300 mg by mouth daily as needed for pain.    [provider]  antiseptic oral rinse (BIOTENE)  LIQD 15 mLs by Mouth Rinse route 3 (three) times daily as needed.     [provider]  aspirin EC 81 MG tablet Take 81 mg by mouth daily.    [provider]  atorvastatin (LIPITOR) 40 MG tablet Take 40 mg by mouth daily.    [provider]  DULoxetine (CYMBALTA) 60 MG capsule Take 60 mg by mouth 2 (two) times daily.    [provider]  hydrOXYzine (ATARAX/VISTARIL) 50 MG tablet Take 1 tablet (50 mg total) by mouth at bedtime. 02/04/18   Patrecia Pour, NP  linaclotide [facility]) 145 MCG CAPS capsule Take 145 mcg by mouth daily before breakfast.    [provider]  Melatonin 10 MG CAPS Take 1 capsule by mouth daily.    [provider]  Menthol, Topical Analgesic, (BIOFREEZE) 4 % GEL Apply 1 application topically 2 (two) times daily as  needed.    [provider]  nortriptyline (PAMELOR) 50 MG capsule Take 25 mg by mouth at bedtime.     [provider]  omeprazole (PRILOSEC) 20 MG capsule TAKE 1 CAPSULE (20 MG TOTAL) BY MOUTH DAILY AT 0600. 11/10/16   [provider]  paliperidone (INVEGA SUSTENNA) 156 MG/ML SUSP injection Inject 156 mg into the muscle every 30 (thirty) days.    [provider]  polyethylene glycol (MIRALAX / GLYCOLAX) packet Take 17 g by mouth daily as needed.    [provider]  risperiDONE (RISPERDAL) 1 MG tablet Take 2 mg by mouth 2 (two) times daily.    [provider]  sodium chloride (MURO 128) 5 % ophthalmic ointment Place 1 application into both eyes 4 (four) times daily as needed for eye irritation.    [provider]    Allergies    Amitriptyline, Flexeril [cyclobenzaprine], and Penicillins  Review of Systems   Review of Systems  All other systems are reviewed and are negative for acute change except as noted in the HPI.   Physical Exam Updated Vital Signs BP (!) 156/98 (BP Location: Right Arm)   Pulse (!) 110   Temp 97.8 F (36.6 C) (Oral)   Resp 20   Ht 5\' 4"  (1.626 m)   Wt 104.3 kg   SpO2 93%   BMI 39.48 kg/m   Physical Exam Vitals and nursing note reviewed.  Constitutional:      General: She is not in acute distress.    Appearance: She is not ill-appearing.  HENT:     Head: Normocephalic and atraumatic.     Comments: No tenderness to palpation of skull. No deformities or crepitus noted. No open wounds, abrasions or lacerations.     Right Ear: Tympanic membrane and external ear normal.     Left Ear: Tympanic membrane and external ear normal.     Nose: Nose normal.     Mouth/Throat:     Mouth: Mucous membranes are moist.     Pharynx: Oropharynx is clear.  Eyes:     General: No scleral icterus.       Right eye: No discharge.        Left eye: No discharge.     Extraocular Movements: Extraocular movements  intact.     Conjunctiva/sclera: Conjunctivae normal.     Pupils: Pupils are equal, round, and reactive to light.  Neck:     Vascular: No JVD.     Comments: Full ROM intact without spinous process TTP. No bony stepoffs or deformities, no paraspinous muscle TTP  or muscle spasms. No rigidity or meningeal signs. No bruising, erythema, or swelling.  Cardiovascular:     Rate and Rhythm: Normal rate and regular rhythm.     Pulses: Normal pulses.          Radial pulses are 2+ on the right side and 2+ on the left side.     Heart sounds: Normal heart sounds.  Pulmonary:     Comments: Lungs clear to auscultation in all fields. Symmetric chest rise. No wheezing, rales, or rhonchi. Abdominal:     Comments: Abdomen is soft, non-distended, and non-tender in all quadrants. No rigidity, no guarding. No peritoneal signs.  Musculoskeletal:        General: Normal range of motion.     Cervical back: Normal range of motion.  Skin:    General: Skin is warm and dry.     Capillary Refill: Capillary refill takes less than 2 seconds.  Neurological:     Mental Status: She is oriented to person, place, and time.     GCS: GCS eye subscore is 4. GCS verbal subscore is 5. GCS motor subscore is 6.     Comments: Fluent speech, no facial droop.  Speech is clear and goal oriented, follows commands CN III-XII intact, no facial droop Normal strength in upper and lower extremities bilaterally including dorsiflexion and plantar flexion, strong and equal grip strength Sensation normal to light and sharp touch Moves extremities without ataxia, coordination intact Normal finger to nose and rapid alternating movements Normal gait and balance  Psychiatric:        Attention and Perception: She perceives auditory (chronic, unchanged per patient) hallucinations. She does not perceive visual hallucinations.        Mood and Affect: Affect is flat.        Speech: Speech normal.        Behavior: Behavior normal.        Thought  Content: Thought content does not include homicidal or suicidal ideation. Thought content does not include homicidal or suicidal plan.       ED Results / Procedures / Treatments   Labs (all labs ordered are listed, but only abnormal results are displayed) Labs Reviewed  RESPIRATORY PANEL BY RT PCR (FLU A&B, COVID)  COMPREHENSIVE METABOLIC PANEL  ETHANOL  RAPID URINE DRUG SCREEN, HOSP PERFORMED  CBC WITH DIFFERENTIAL/PLATELET    EKG None  Radiology No results found.  Procedures Procedures (including critical care time)  Medications Ordered in ED Medications  acetaminophen (TYLENOL) tablet 650 mg (650 mg Oral Given 01/23/20 2118)  alum & mag hydroxide-simeth (MAALOX/MYLANTA) 200-200-20 MG/5ML suspension 30 mL (30 mLs Oral Given 01/23/20 2118)    ED Course  I have reviewed the triage vital signs and the nursing notes.  Pertinent labs & imaging results that were available during my care of the patient were reviewed by me and considered in my medical decision making (see chart for details).    MDM Rules/Calculators/A&P                      Patient seen and examined. Patient presents awake, alert, hemodynamically stable, afebrile, non toxic. She was tachycardic to 110 in triage, during my exam heart rate ranges from 98-103.  Neuro exam is normal.  Presentation is like pts typical HA and non concerning for [facility], ICH, Meningitis, or temporal arteritis. Pt is afebrile with no focal neuro deficits, nuchal rigidity, or change in vision. Given PO tylenol. She does not appear to  be responding to internal stimuli. Patient denies suicidal or homicidal ideations.  She denies visual hallucinations but states she has auditory hallucinations.  She states this is typical for her.  She states the voices are not telling her to hurt herself or anybody else. Given her flat affect, lives home alone, and came to the emergency department for psychiatric evaluation, will proceed with TTS evaluation.  Psych  labs ordered, not yet collected.   Patient care transferred to Dr. Kathrynn Humble at the end of my shift pending lab results and TTS evaluation. Patient presentation, ED course, and plan of care discussed with review of all pertinent labs and imaging. Please see his note for further details regarding further ED course and disposition.   Portions of this note were generated with [employer]. Dictation errors may occur despite best attempts at proofreading.    Final Clinical Impression(s) / ED Diagnoses Final diagnoses:  Anxiety    Rx / DC Orders ED Discharge Orders    None       Flint Melter 01/23/20 2153    Varney Biles, MD 01/24/20 1622

## 2020-01-23 NOTE — ED Notes (Signed)
Provided pt w/labeled urine specimen cup for collection per MD order. [employer]

## 2020-01-23 NOTE — ED Triage Notes (Signed)
Patient c/o having increased anxiety 3 weeks ago. Patient states she takes medication for the same.

## 2020-01-24 ENCOUNTER — Encounter (HOSPITAL_COMMUNITY): Payer: Self-pay | Admitting: Registered Nurse

## 2020-01-24 DIAGNOSIS — F411 Generalized anxiety disorder: Secondary | ICD-10-CM

## 2020-01-24 DIAGNOSIS — F419 Anxiety disorder, unspecified: Secondary | ICD-10-CM | POA: Diagnosis not present

## 2020-01-24 HISTORY — DX: Generalized anxiety disorder: F41.1

## 2020-01-24 LAB — CBC WITH DIFFERENTIAL/PLATELET
Abs Immature Granulocytes: 0.01 10*3/uL (ref 0.00–0.07)
Basophils Absolute: 0 10*3/uL (ref 0.0–0.1)
Basophils Relative: 1 %
Eosinophils Absolute: 0 10*3/uL (ref 0.0–0.5)
Eosinophils Relative: 0 %
HCT: 35.4 % — ABNORMAL LOW (ref 36.0–46.0)
Hemoglobin: 11.2 g/dL — ABNORMAL LOW (ref 12.0–15.0)
Immature Granulocytes: 0 %
Lymphocytes Relative: 42 %
Lymphs Abs: 1.8 10*3/uL (ref 0.7–4.0)
MCH: 29.8 pg (ref 26.0–34.0)
MCHC: 31.6 g/dL (ref 30.0–36.0)
MCV: 94.1 fL (ref 80.0–100.0)
Monocytes Absolute: 0.5 10*3/uL (ref 0.1–1.0)
Monocytes Relative: 12 %
Neutro Abs: 1.9 10*3/uL (ref 1.7–7.7)
Neutrophils Relative %: 45 %
Platelets: 353 10*3/uL (ref 150–400)
RBC: 3.76 MIL/uL — ABNORMAL LOW (ref 3.87–5.11)
RDW: 13.9 % (ref 11.5–15.5)
WBC: 4.2 10*3/uL (ref 4.0–10.5)
nRBC: 0 % (ref 0.0–0.2)

## 2020-01-24 MED ORDER — RISPERIDONE 2 MG PO TABS: 2.0000 mg | ORAL_TABLET | Freq: Once | ORAL | Status: DC

## 2020-01-24 MED ORDER — HYDROXYZINE PAMOATE 25 MG PO CAPS: 25.0000 mg | ORAL_CAPSULE | Freq: Three times a day (TID) | ORAL | 0 refills | Status: DC | PRN

## 2020-01-24 NOTE — Progress Notes (Signed)
CSW attempting to set up transport for patient through [facility]. CSW has called x2 and left a VM. CSW will continue to follow up.  Golden Circle, LCSW Transitions of Care Department [facility] ED [phone]

## 2020-01-24 NOTE — BH Assessment (Signed)
Tele Assessment Note   Patient Name: Bridget [name] MRN: [id number] Referring Physician: Emeterio Reeve, PA Location of Patient: [facility] Location of Provider: [place] is an 64 y.o. female presenting to [facility] with concern of worsening anxiety. Patient reported calling [facility], whom brought her to the hospital. Patient reported onset of anxiety for the past 3 weeks. Patient reported taking her anxiety medications in the past, however she is not taking any medication currently. Patient reported she has taken Vistaril in the past. Patient reported her primary care doctor told her to stop taking the Vistaril but did not recommend another medication. Patient reported hearing voices, however stated, "I don't want to talk about it". Patient was not forthcoming at times with information. Patient was seemed to be paranoid, not wanting to sit down and watching machine as if she did not know what it was. Patient seemed to be very cautious when answering questions.   Patient reported seeing Dr. Jimmye Norman in [place] for medication management, conflicting information, stated earlier received medications from primary care doctor. Patient denied self-harming behaviors. Patient reported sleeping 2 hours nightly and normal appetite.   Collateral Contact: [name], daughter, [phone] Unable to reach daughter at this time. TTS to try again later.   Diagnosis: Anxiety disorder and hx of schizophrenia  Past Medical History:  Past Medical History:  Diagnosis Date  . Anxiety   . Back pain    low back - arthritis  . Barrett's esophagus   . Chronic headaches   . Constipation    miralax daily  . Depression   . Diabetes ([place])    per patient, MD took her off medication - no meds  . Dry mouth    biotene  . Fibromyalgia   . GERD (gastroesophageal reflux disease)   . Hyperlipemia   . Insomnia   . Myoclonus    patient states "no longer an issue"  .  Osteoarthritis    lower back, shoulders, hands  . Schizoaffective disorder ([place])   . SVD (spontaneous vaginal delivery)    x 3  . Vertigo   . Wears dentures    full    Past Surgical History:  Procedure Laterality Date  . ABDOMINAL HYSTERECTOMY    . BREAST EXCISIONAL BIOPSY Right   . BREAST EXCISIONAL BIOPSY Right   . BREAST EXCISIONAL BIOPSY Right   . BREAST EXCISIONAL BIOPSY Right   . BUNIONECTOMY Bilateral   . COLONOSCOPY  08/2013  . HAND SURGERY Right    carpel tunnel surgery  . MULTIPLE TOOTH EXTRACTIONS     full dentures  . TRIGGER FINGER RELEASE Bilateral    thumbs    Family History:  Family History  Problem Relation Age of Onset  . Diabetes Mother   . Prostate cancer Father   . Colon cancer Neg Hx   . Colon polyps Neg Hx   . Rectal cancer Neg Hx   . Stomach cancer Neg Hx     Social History:  reports that she has never smoked. She has never used smokeless tobacco. She reports that she does not drink alcohol or use drugs.  Additional Social History:  Alcohol / Drug Use Pain Medications: see MAR Prescriptions: see MAR Over the Counter: see MAR  CIWA: CIWA-Ar BP: (!) 138/98 Pulse Rate: (!) 101 COWS:    Allergies:  Allergies  Allergen Reactions  . Amitriptyline Other (See Comments)    Urinary retention  . Flexeril [Cyclobenzaprine]     Urinary  retention  . Penicillins Rash    Has patient had a PCN reaction causing immediate rash, facial/tongue/throat swelling, SOB or lightheadedness with hypotension: yes Has patient had a PCN reaction causing severe rash involving mucus membranes or skin necrosis: no Has patient had a PCN reaction that required hospitalization: No Has patient had a PCN reaction occurring within the last 10 years: No If all of the above answers are "NO", then may proceed with Cephalosporin use.     Home Medications: (Not in a hospital admission)   OB/GYN Status:  No LMP recorded. Patient has had a hysterectomy.  General  Assessment Data Location of Assessment: [facility] TTS Assessment: In system Is this a Tele or Face-to-Face Assessment?: Tele Assessment Is this an Initial Assessment or a Re-assessment for this encounter?: Initial Assessment Patient Accompanied by:: N/A Language Other than English: No Living Arrangements: (alone) What gender do you identify as?: Female Marital status: Divorced Living Arrangements: Alone Can pt return to current living arrangement?: Yes Admission Status: Voluntary Is patient capable of signing voluntary admission?: Yes Referral Source: Self/Family/Friend     Crisis Care Plan Living Arrangements: Alone Legal Guardian: (self) Name of Psychiatrist: (Dr. Mariann Laster) Name of Therapist: (none)  Education Status Is patient currently in school?: No Is the patient employed, unemployed or receiving disability?: Receiving disability income  Risk to self with the past 6 months Suicidal Ideation: No Has patient been a risk to self within the past 6 months prior to admission? : No Suicidal Intent: No Has patient had any suicidal intent within the past 6 months prior to admission? : No Is patient at risk for suicide?: No Suicidal Plan?: No Has patient had any suicidal plan within the past 6 months prior to admission? : No Access to Means: No What has been your use of drugs/alcohol within the last 12 months?: (none reported) Previous Attempts/Gestures: No How many times?: (0) Other Self Harm Risks: (none) Triggers for Past Attempts: (none) Intentional Self Injurious Behavior: None Family Suicide History: Unable to assess Recent stressful life event(s): Other (Comment)(anxiety) Persecutory voices/beliefs?: No Depression: Yes Depression Symptoms: Insomnia, Tearfulness, Isolating, Fatigue, Guilt, Loss of interest in usual pleasures, Feeling worthless/self pity Substance abuse history and/or treatment for substance abuse?: No Suicide prevention information given to non-admitted  patients: Not applicable  Risk to Others within the past 6 months Homicidal Ideation: No Does patient have any lifetime risk of violence toward others beyond the six months prior to admission? : No Thoughts of Harm to Others: No Current Homicidal Intent: No Current Homicidal Plan: No Access to Homicidal Means: No Identified Victim: (none) History of harm to others?: No Assessment of Violence: None Noted Violent Behavior Description: (none) Does patient have access to weapons?: No Criminal Charges Pending?: No Does patient have a court date: No Is patient on probation?: No  Psychosis Hallucinations: Auditory Delusions: Unspecified  Mental Status Report Appearance/Hygiene: Unremarkable Eye Contact: Good Motor Activity: Freedom of movement, Restlessness Speech: Soft, Slow Level of Consciousness: Alert, Restless Mood: Depressed, Anxious Affect: Anxious, Appropriate to circumstance, Depressed Anxiety Level: Moderate Thought Processes: Relevant Judgement: Impaired Orientation: Person, Place, Time, Situation Obsessive Compulsive Thoughts/Behaviors: None  Cognitive Functioning Concentration: Fair Memory: Recent Intact Is patient IDD: No Insight: Poor Impulse Control: Poor Appetite: Fair Have you had any weight changes? : No Change Sleep: No Change Total Hours of Sleep: (2) Vegetative Symptoms: Unable to Assess  ADLScreening [facility] [place] Assessment Services) Patient's cognitive ability adequate to safely complete daily activities?: Yes Patient able to express need  for assistance with ADLs?: Yes Independently performs ADLs?: Yes (appropriate for developmental age)  Prior Inpatient Therapy Prior Inpatient Therapy: No  Prior Outpatient Therapy Prior Outpatient Therapy: Yes Prior Therapy Dates: (present) Prior Therapy Facilty/Provider(s): (Dr. Mariann Laster) Reason for Treatment: (schizophrenia) Does patient have an ACCT team?: No Does patient have Intensive In-House Services?  :  No Does patient have [facility] services? : No Does patient have P4CC services?: No  ADL Screening (condition at time of admission) Patient's cognitive ability adequate to safely complete daily activities?: Yes Patient able to express need for assistance with ADLs?: Yes Independently performs ADLs?: Yes (appropriate for developmental age)  Regulatory affairs officer (For Healthcare) Does Patient Have a Medical Advance Directive?: No Would patient like information on creating a medical advance directive?: No - Patient declined   Disposition:  Disposition Initial Assessment Completed for this Encounter: Yes  Anette Riedel, NP, recommends overnight observation for safety and stabilization with psych re evalution in the AM.  This service was provided via telemedicine using a 2-way, interactive audio and video technology.  Names of all persons participating in this telemedicine service and their role in this encounter. Name: Bridget [name] Role: Patient  Name: Kirtland Bouchard Role: TTS Clinicain  Name:  Role:   Name:  Role:     Venora Maples 01/24/2020 4:12 AM

## 2020-01-24 NOTE — ED Notes (Signed)
Patient has 2 personal belonging bags and a tan leather handbag in locker 29.  Patient has her personal bible in the room with her

## 2020-01-24 NOTE — Progress Notes (Signed)
CSW spoke with Bridget [name]'s daughter Bridget [name] and updated her on Bridget [name]'s disposition. Daughter states that she is Bridget [name]'s medical POA. She lives in [place], [place]. She voiced concern that Bridget [name] has been increasingly anxious over the past several months. Bridget [name] lives alone, but in a building with other people with disabilities. Daughter states that several people in the building have died recently, with be an additional trigger for her anxiety. Daughter reports that Bridget [name] receives psychiatric services through [facility]. CSW encouraged her to contact them link Bridget [name] with outpatient therapy.   Audree Camel, LCSW, [place] Disposition [facility] [facility]/TTS [phone]

## 2020-01-24 NOTE — ED Notes (Signed)
Patient speaking with TTS.  

## 2020-01-24 NOTE — ED Notes (Signed)
Pt discharged safely with RX and discharge instructions reviewed.  Pt verbalized understanding.  All belongings were returned

## 2020-01-24 NOTE — ED Notes (Signed)
Social Work is arranging transportation.

## 2020-01-24 NOTE — Progress Notes (Signed)
CSW spoke with Nira Conn with [facility] [employer] who reports there is a driver on his way to pick patient up and should arrive in 10 minutes. CSW made RN aware.   Golden Circle, LCSW Transitions of Care Department [facility] ED [phone]

## 2020-01-24 NOTE — [facility] Suicide Risk Assessment (Cosign Needed)
Suicide Risk Assessment  Discharge Assessment   [facility] Discharge Suicide Risk Assessment   Principal Problem: GAD (generalized anxiety disorder) Discharge Diagnoses: Principal Problem:   GAD (generalized anxiety disorder) Active Problems:   Schizoaffective disorder, bipolar type ([place])   Total Time spent with patient: 30 minutes  Musculoskeletal: Strength & Muscle Tone: within normal limits Gait & Station: normal Patient leans: N/A  Psychiatric Specialty Exam:   Blood pressure 113/64, pulse 83, temperature 97.8 F (36.6 C), temperature source Oral, resp. rate 16, height 5\' 4"  (1.626 m), weight 104.3 kg, SpO2 96 %.Body mass index is 39.48 kg/m.  General Appearance: Casual  Eye Contact::  Good  Speech:  Clear and Coherent and Normal Rate409  Volume:  Normal  Mood:  Anxious  Affect:  Appropriate and Congruent  Thought Process:  Coherent, Goal Directed and Descriptions of Associations: Intact  Orientation:  Full (Time, Place, and Person)  Thought Content:  WDL  Suicidal Thoughts:  No  Homicidal Thoughts:  No  Memory:  Immediate;   Fair Recent;   Fair  Judgement:  Intact  Insight:  Present  Psychomotor Activity:  Normal  Concentration:  Fair  Recall:  [place] of Knowledge:Fair  Language: Good  Akathisia:  No  Handed:  Right  AIMS (if indicated):     Assets:  Communication Skills Desire for Improvement Housing Social Support Transportation  Sleep:     Cognition: WNL  ADL's:  Intact   Mental Status Per Nursing Assessment::   On Admission:    Bridget [name], 64 y.o., female patient seen via tele psych by this provider, Dr. Dwyane Dee; and chart reviewed on 01/24/20.  On evaluation Bridget [name] reports she came to the hospital because she was having anxiety.  Patient asked what she was worried about and she responded " life in general."  Patient reports he lives alone but has services through [facility], and she has a daughter that checks on her every day.   Patient reports she has outpatient psychiatric services with Dr. Alda Ponder.  Patient denies suicidal/self-harm/homicidal ideation, psychosis, paranoia.  Patient gave permission to speak to her daughter for collateral information.   During evaluation Bridget [name] is alert/oriented x 4; calm/cooperative; and mood anxious and is congruent with affect.  She does not appear to be responding to internal/external stimuli or delusional thoughts.  Patient denies suicidal/self-harm/homicidal ideation, psychosis, and paranoia, but states she does have a history of auditory hallucination non-command.  Patient answered question appropriately.   Collateral information: Spoke with patient's daughter Bridget [name] at [phone]) reports that her mother is living in a housing with other elderly who has services through [facility].  States that her mother does have a psychiatrist but does not have therapy.  States that her mother's anxiety has worsened related to Covid-19 and some of her friends have passed away.  States that her mother feels isolated sometimes her house cannot get out as much related to Covid.  Patient's daughter agrees with therapy would be helpful for her mother.  Feels that her mother is safe to go home and asked that we could consult with [facility] related to setting her mother with therapy and transportation home from the hospital. Attempted to contact patient's nurse Wynonia Musty at [phone]) no answer left message on voicemail.  Social work consult ordered to arrange transportation home.  Demographic Factors:  NA  Loss Factors: NA  Historical Factors: NA  Risk Reduction Factors:   Sense of  responsibility to family, Religious beliefs about death and Positive social support  Continued Clinical Symptoms:  Previous Psychiatric Diagnoses and Treatments  Cognitive Features That Contribute To Risk:  None    Suicide Risk:  Minimal: No identifiable suicidal ideation.   Patients presenting with no risk factors but with morbid ruminations; may be classified as minimal risk based on the severity of the depressive symptoms   Plan Of Care/Follow-up recommendations:  Activity:  As tolerated Diet:  Heart healthy  Disposition: Patient psychiatrically cleared No evidence of imminent risk to self or others at present.   Patient does not meet criteria for psychiatric inpatient admission. Supportive therapy provided about ongoing stressors. Discussed crisis plan, support from social network, calling 911, coming to the Emergency Department, and calling Suicide Hotline.   Outpatient psychiatric services to arrange therapy for patient.  Tajay Muzzy, NP 01/24/2020, 10:41 AM

## 2020-02-16 ENCOUNTER — Ambulatory Visit: Payer: Medicare (Managed Care) | Admitting: Psychology

## 2020-02-22 ENCOUNTER — Ambulatory Visit: Payer: Medicare (Managed Care)

## 2020-02-24 ENCOUNTER — Other Ambulatory Visit: Payer: Self-pay

## 2020-02-24 ENCOUNTER — Ambulatory Visit
Admission: RE | Admit: 2020-02-24 | Discharge: 2020-02-24 | Disposition: A | Discharge status: Home / Self Care | Payer: Medicare (Managed Care) | Source: Ambulatory Visit | Attending: Internal Medicine | Admitting: Internal Medicine

## 2020-02-24 DIAGNOSIS — Z1231 Encounter for screening mammogram for malignant neoplasm of breast: Secondary | ICD-10-CM

## 2020-11-14 ENCOUNTER — Other Ambulatory Visit: Payer: Self-pay | Admitting: Internal Medicine

## 2020-11-14 DIAGNOSIS — Z1231 Encounter for screening mammogram for malignant neoplasm of breast: Secondary | ICD-10-CM

## 2021-02-25 ENCOUNTER — Other Ambulatory Visit: Payer: Self-pay

## 2021-02-25 ENCOUNTER — Ambulatory Visit
Admission: RE | Admit: 2021-02-25 | Discharge: 2021-02-25 | Disposition: A | Discharge status: Home / Self Care | Payer: Medicare (Managed Care) | Source: Ambulatory Visit | Attending: Internal Medicine | Admitting: Internal Medicine

## 2021-02-25 DIAGNOSIS — Z1231 Encounter for screening mammogram for malignant neoplasm of breast: Secondary | ICD-10-CM

## 2021-05-24 ENCOUNTER — Ambulatory Visit (INDEPENDENT_AMBULATORY_CARE_PROVIDER_SITE_OTHER): Payer: Medicare (Managed Care) | Admitting: Orthopaedic Surgery

## 2021-05-24 ENCOUNTER — Encounter: Payer: Self-pay | Admitting: Orthopaedic Surgery

## 2021-05-24 ENCOUNTER — Ambulatory Visit: Payer: Self-pay

## 2021-05-24 ENCOUNTER — Other Ambulatory Visit: Payer: Self-pay

## 2021-05-24 DIAGNOSIS — G8929 Other chronic pain: Secondary | ICD-10-CM | POA: Diagnosis not present

## 2021-05-24 DIAGNOSIS — M25561 Pain in right knee: Secondary | ICD-10-CM

## 2021-05-24 DIAGNOSIS — M542 Cervicalgia: Secondary | ICD-10-CM | POA: Diagnosis not present

## 2021-05-24 DIAGNOSIS — M25511 Pain in right shoulder: Secondary | ICD-10-CM | POA: Diagnosis not present
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
## 2021-05-25 DIAGNOSIS — M25511 Pain in right shoulder: Secondary | ICD-10-CM | POA: Diagnosis not present

## 2021-05-25 DIAGNOSIS — G8929 Other chronic pain: Secondary | ICD-10-CM

## 2021-05-25 MED ORDER — METHYLPREDNISOLONE ACETATE 40 MG/ML IJ SUSP
40.0000 mg | INTRAMUSCULAR | Status: AC | PRN
  Administered 2021-05-25: 40 mg via INTRA_ARTICULAR

## 2021-05-25 MED ORDER — BUPIVACAINE HCL 0.5 % IJ SOLN
3.0000 mL | INTRAMUSCULAR | Status: AC | PRN
  Administered 2021-05-25: 3 mL via INTRA_ARTICULAR

## 2021-05-25 MED ORDER — LIDOCAINE HCL 1 % IJ SOLN
3.0000 mL | INTRAMUSCULAR | Status: AC | PRN
  Administered 2021-05-25: 3 mL

## 2021-06-04 ENCOUNTER — Institutional Professional Consult (permissible substitution): Payer: Medicare (Managed Care) | Admitting: Pulmonary Disease

## 2021-07-16 ENCOUNTER — Other Ambulatory Visit: Payer: Self-pay

## 2021-07-16 ENCOUNTER — Encounter: Payer: Self-pay | Admitting: Pulmonary Disease

## 2021-07-16 ENCOUNTER — Ambulatory Visit (INDEPENDENT_AMBULATORY_CARE_PROVIDER_SITE_OTHER): Payer: Medicare (Managed Care) | Admitting: Pulmonary Disease

## 2021-07-16 VITALS — BP 124/78 | HR 77 | Temp 97.8°F | Ht 63.0 in | Wt 232.0 lb

## 2021-07-16 DIAGNOSIS — F5104 Psychophysiologic insomnia: Secondary | ICD-10-CM | POA: Diagnosis not present

## 2021-07-16 DIAGNOSIS — F250 Schizoaffective disorder, bipolar type: Secondary | ICD-10-CM

## 2021-07-16 MED ORDER — ESZOPICLONE 2 MG PO TABS: 2.0000 mg | ORAL_TABLET | Freq: Every evening | ORAL | 3 refills | Status: DC | PRN

## 2021-07-16 NOTE — Progress Notes (Signed)
Bridget [name]    [id number]    Feb 22, 1956  Primary Care Physician:Koehler, Angelyn Punt, MD  Referring Physician: Janifer Adie, MD [place],  [place] [address]  Chief complaint:   Patient being seen today for chronic insomnia  HPI:  She states she has not been sleeping for many years Has had sleep problems for about 4 years  Has not had any sleep for almost 3 years  Muscle relaxant did help sleep about a week ago  Usually goes to lay in the bed about 9 PM, Wake up time about 10 AM  States she does not sleep at all  Brother with sleep issues as well  Not sleepy during the day No dryness of the mouth in the mornings  History of snoring, no history of concern for sleep apnea  Has tried multiple medications in the past that has not helped with sleep and Currently using trazodone which does not help her sleep  She does have a history of schizoaffective disorder-bipolar type, generalized anxiety disorder  Outpatient Encounter Medications as of 07/16/2021  Medication Sig   acetaminophen (TYLENOL 8 HOUR) 650 MG CR tablet Take 1,300 mg by mouth daily as needed for pain.   DULoxetine (CYMBALTA) 60 MG capsule Take 60 mg by mouth 2 (two) times daily.   esomeprazole (NEXIUM) 40 MG capsule Take 40 mg by mouth daily at 12 noon.   hydrOXYzine (VISTARIL) 25 MG capsule Take 1 capsule (25 mg total) by mouth 3 (three) times daily as needed for anxiety.   meclizine (ANTIVERT) 12.5 MG tablet Take 12.5 mg by mouth 3 (three) times daily.   polyethylene glycol (MIRALAX / GLYCOLAX) packet Take 17 g by mouth daily as needed.   risperiDONE (RISPERDAL) 1 MG tablet Take 1-2 mg by mouth See admin instructions. 1 MG at night for agitation and 2 MG at bedtime for sleep   rosuvastatin (CRESTOR) 20 MG tablet Take 20 mg by mouth daily.   sodium chloride (MURO 128) 5 % ophthalmic ointment Place 1 application into both eyes 4 (four) times daily as needed for eye irritation.    traZODone (DESYREL) 50 MG tablet Take 50 mg by mouth at bedtime as needed for sleep.   No facility-administered encounter medications on file as of 07/16/2021.    Allergies as of 07/16/2021 - Review Complete 07/16/2021  Allergen Reaction Noted   Amitriptyline Other (See Comments) 09/22/2017   Flexeril [cyclobenzaprine]  09/22/2017   Penicillins Rash 09/22/2017    Past Medical History:  Diagnosis Date   Anxiety    Back pain    low back - arthritis   Barrett's esophagus    Chronic headaches    Constipation    miralax daily   Depression    Diabetes ([place])    per patient, MD took her off medication - no meds   Dry mouth    biotene   Fibromyalgia    GERD (gastroesophageal reflux disease)    Hyperlipemia    Insomnia    Myoclonus    patient states "no longer an issue"   Osteoarthritis    lower back, shoulders, hands   Schizoaffective disorder (HCC)    SVD (spontaneous vaginal delivery)    x 3   Vertigo    Wears dentures    full    Past Surgical History:  Procedure Laterality Date   ABDOMINAL HYSTERECTOMY     BREAST EXCISIONAL BIOPSY Right    BREAST  EXCISIONAL BIOPSY Right    BREAST EXCISIONAL BIOPSY Right    BREAST EXCISIONAL BIOPSY Right    BUNIONECTOMY Bilateral    COLONOSCOPY  08/2013   HAND SURGERY Right    carpel tunnel surgery   MULTIPLE TOOTH EXTRACTIONS     full dentures   TRIGGER FINGER RELEASE Bilateral    thumbs    Family History  Problem Relation Age of Onset   Diabetes Mother    Prostate cancer Father    Colon cancer Neg Hx    Colon polyps Neg Hx    Rectal cancer Neg Hx    Stomach cancer Neg Hx     Social History   Socioeconomic History   Marital status: Married    Spouse name: Not on file   Number of children: Not on file   Years of education: Not on file   Highest education level: Not on file  Occupational History   Not on file  Tobacco Use   Smoking status: Never   Smokeless tobacco: Never  Vaping Use   Vaping Use: Never used   Substance and Sexual Activity   Alcohol use: No   Drug use: No   Sexual activity: Never    Birth control/protection: Post-menopausal    Comment: Hysterectomy  Other Topics Concern   Not on file  Social History Narrative   Not on file   Social Determinants of Health   Financial Resource Strain: Not on file  Food Insecurity: Not on file  Transportation Needs: Not on file  Physical Activity: Not on file  Stress: Not on file  Social Connections: Not on file  Intimate Partner Violence: Not on file    Review of Systems  Psychiatric/Behavioral:  Positive for sleep disturbance.   All other systems reviewed and are negative.  Vitals:   07/16/21 1133  BP: 124/78  Pulse: 77  Temp: 97.8 F (36.6 C)  SpO2: 97%     Physical Exam Constitutional:      Appearance: She is obese.  HENT:     Head: Normocephalic.     Mouth/Throat:     Mouth: Mucous membranes are moist.     Comments: Mallampati 3, crowded oropharynx Cardiovascular:     Rate and Rhythm: Normal rate and regular rhythm.     Heart sounds: No murmur heard.   No friction rub.  Pulmonary:     Effort: No respiratory distress.     Breath sounds: No stridor. No wheezing or rhonchi.  Musculoskeletal:     Cervical back: No rigidity or tenderness.  Neurological:     Mental Status: She is alert.  Psychiatric:        Mood and Affect: Mood normal.   Epworth Sleepiness Scale of 0   Assessment:  Chronic insomnia  Insomnia related to medical condition  Plan/Recommendations: Trial with Lunesta 2 mg nightly  Keep 2-week sleep log  Tentative follow-up in 4 to 6 weeks  Exercise as tolerated   Sherrilyn Rist MD [facility] 07/16/2021, 11:58 AM  CC: Janifer Adie, MD

## 2021-07-16 NOTE — Patient Instructions (Signed)
Chronic insomnia  Try to keep a regular schedule of bedtime and a wake time About 8 hours of sleep at night is good  Prescription for Lunesta 2 mg to be used nightly  Keep a sleep log  Follow-up in 4 to 6 weeks

## 2021-08-20 ENCOUNTER — Ambulatory Visit: Payer: Medicare (Managed Care) | Admitting: Orthopaedic Surgery

## 2021-08-26 ENCOUNTER — Ambulatory Visit: Payer: Medicare (Managed Care) | Admitting: Pulmonary Disease

## 2021-08-27 ENCOUNTER — Ambulatory Visit (INDEPENDENT_AMBULATORY_CARE_PROVIDER_SITE_OTHER): Payer: Medicare (Managed Care) | Admitting: Orthopaedic Surgery

## 2021-08-27 ENCOUNTER — Other Ambulatory Visit: Payer: Self-pay

## 2021-08-27 ENCOUNTER — Ambulatory Visit (INDEPENDENT_AMBULATORY_CARE_PROVIDER_SITE_OTHER): Payer: Medicare (Managed Care)

## 2021-08-27 VITALS — Ht 63.0 in | Wt 232.0 lb

## 2021-08-27 DIAGNOSIS — M25511 Pain in right shoulder: Secondary | ICD-10-CM

## 2021-08-27 DIAGNOSIS — M25512 Pain in left shoulder: Secondary | ICD-10-CM

## 2021-08-27 DIAGNOSIS — G8929 Other chronic pain: Secondary | ICD-10-CM | POA: Diagnosis not present

## 2021-08-27 MED ORDER — TRAMADOL HCL 50 MG PO TABS: 50.0000 mg | ORAL_TABLET | Freq: Every day | ORAL | 0 refills | Status: DC | PRN

## 2021-08-27 NOTE — Progress Notes (Signed)
Office Visit Note   Patient: Bridget [name]           Date of Birth: 1956/11/08           MRN: [id number] Visit Date: 08/27/2021              Requested by: Janifer Adie, MD [address] [place],  [place] [address] PCP: Janifer Adie, MD   Assessment & Plan: Visit Diagnoses:  1. Chronic left shoulder pain   2. Chronic right shoulder pain     Plan: Impression is chronic bilateral shoulder pain without relief from prior cortisone injection.  This point we will obtain MRI scans to rule out structural abnormalities.  Tramadol prescribed today for breakthrough pain.  Follow-up after the MRI.  Follow-Up Instructions: No follow-ups on file.   Orders:  Orders Placed This Encounter  Procedures   XR Shoulder Left   Meds ordered this encounter  Medications   traMADol (ULTRAM) 50 MG tablet    Sig: Take 1-2 tablets (50-100 mg total) by mouth daily as needed.    Dispense:  20 tablet    Refill:  0      Procedures: No procedures performed   Clinical Data: No additional findings.   Subjective: Chief Complaint  Patient presents with   Left Shoulder - Pain    Bridget [name] is a 65 year old female here for bilateral shoulder pain.  Previous intra-articular injection to the right shoulder on 05/24/2021 did not help.  She continues to have bilateral shoulder pain with movement and with activity and raising above head.  Denies any numbness and tingling.  She is right-hand dominant.   Review of Systems   Objective: Vital Signs: Ht '5\' 3"'$  (1.6 m)   Wt 232 lb (105.2 kg)   BMI 41.10 kg/m   Physical Exam  Ortho Exam  Bilateral shoulders show pain with any attempted range of motion in all planes.  Strength grossly intact.  There is guarding with manual muscle testing of the rotator cuff.  Specialty Comments:  No specialty comments available.  Imaging: No results found.   PMFS History: Patient Active Problem List   Diagnosis Date Noted   GAD (generalized anxiety  disorder) 01/24/2020   Schizoaffective disorder, bipolar type ([place]) 02/04/2018   Past Medical History:  Diagnosis Date   Anxiety    Back pain    low back - arthritis   Barrett's esophagus    Chronic headaches    Constipation    miralax daily   Depression    Diabetes ([place])    per patient, MD took her off medication - no meds   Dry mouth    biotene   Fibromyalgia    GERD (gastroesophageal reflux disease)    Hyperlipemia    Insomnia    Myoclonus    patient states "no longer an issue"   Osteoarthritis    lower back, shoulders, hands   Schizoaffective disorder (HCC)    SVD (spontaneous vaginal delivery)    x 3   Vertigo    Wears dentures    full    Family History  Problem Relation Age of Onset   Diabetes Mother    Prostate cancer Father    Colon cancer Neg Hx    Colon polyps Neg Hx    Rectal cancer Neg Hx    Stomach cancer Neg Hx     Past Surgical History:  Procedure Laterality Date   ABDOMINAL HYSTERECTOMY     BREAST EXCISIONAL BIOPSY  Right    BREAST EXCISIONAL BIOPSY Right    BREAST EXCISIONAL BIOPSY Right    BREAST EXCISIONAL BIOPSY Right    BUNIONECTOMY Bilateral    COLONOSCOPY  08/2013   HAND SURGERY Right    carpel tunnel surgery   MULTIPLE TOOTH EXTRACTIONS     full dentures   TRIGGER FINGER RELEASE Bilateral    thumbs   Social History   Occupational History   Not on file  Tobacco Use   Smoking status: Never   Smokeless tobacco: Never  Vaping Use   Vaping Use: Never used  Substance and Sexual Activity   Alcohol use: No   Drug use: No   Sexual activity: Never    Birth control/protection: Post-menopausal    Comment: Hysterectomy

## 2021-08-27 NOTE — Addendum Note (Signed)
Addended by: Precious Bard on: 08/27/2021 02:47 PM   Modules accepted: Orders

## 2021-08-28 ENCOUNTER — Telehealth: Payer: Self-pay | Admitting: Orthopaedic Surgery

## 2021-08-28 NOTE — Telephone Encounter (Signed)
Called left 1X vm for pt to call and set an MRI review appt with Dr. Erlinda Hong after 09/16/21

## 2021-08-29 ENCOUNTER — Telehealth: Payer: Self-pay | Admitting: Orthopaedic Surgery

## 2021-08-29 NOTE — Telephone Encounter (Signed)
Called pt and pt stated she need to contact [facility] to contact us to set her appt to come in to see Dr. Erlinda Hong for MRI Review after 09/16/21. Wait for [facility] to call on behalf of pt

## 2021-08-30 ENCOUNTER — Telehealth: Payer: Self-pay | Admitting: Orthopaedic Surgery

## 2021-08-30 NOTE — Telephone Encounter (Signed)
Pt still waiting for [facility] to call and set her appt for transportation purposes.

## 2021-09-03 ENCOUNTER — Telehealth: Payer: Self-pay | Admitting: Orthopaedic Surgery

## 2021-09-03 NOTE — Telephone Encounter (Signed)
Called pt on several attempts to set an appt for MRI Review after 10/3. No response as of now

## 2021-09-16 ENCOUNTER — Other Ambulatory Visit: Payer: Medicare (Managed Care)

## 2021-09-18 ENCOUNTER — Other Ambulatory Visit: Payer: Self-pay | Admitting: Family Medicine

## 2021-09-18 DIAGNOSIS — M5431 Sciatica, right side: Secondary | ICD-10-CM

## 2021-09-30 ENCOUNTER — Ambulatory Visit
Admission: RE | Admit: 2021-09-30 | Discharge: 2021-09-30 | Disposition: A | Discharge status: Home / Self Care | Payer: Medicare (Managed Care) | Source: Ambulatory Visit | Attending: Orthopaedic Surgery | Admitting: Orthopaedic Surgery

## 2021-09-30 DIAGNOSIS — G8929 Other chronic pain: Secondary | ICD-10-CM

## 2021-10-01 NOTE — Progress Notes (Signed)
Needs appt

## 2021-10-02 ENCOUNTER — Telehealth: Payer: Self-pay

## 2021-10-02 NOTE — Telephone Encounter (Signed)
Called patient no answer. LMOM. Needs an appt to review MRI

## 2021-10-02 NOTE — Progress Notes (Signed)
Called patient no answer LMOM. Needs to f/u to review MRI.

## 2021-10-08 ENCOUNTER — Ambulatory Visit: Payer: Medicare (Managed Care) | Admitting: Pulmonary Disease

## 2021-10-10 ENCOUNTER — Inpatient Hospital Stay: Admission: RE | Admit: 2021-10-10 | Payer: Medicare (Managed Care) | Source: Ambulatory Visit

## 2021-10-17 ENCOUNTER — Ambulatory Visit (INDEPENDENT_AMBULATORY_CARE_PROVIDER_SITE_OTHER): Payer: Medicare (Managed Care) | Admitting: Pulmonary Disease

## 2021-10-17 ENCOUNTER — Encounter: Payer: Self-pay | Admitting: Pulmonary Disease

## 2021-10-17 ENCOUNTER — Other Ambulatory Visit: Payer: Self-pay

## 2021-10-17 VITALS — BP 124/70 | HR 90 | Temp 98.2°F | Ht 64.0 in | Wt 230.0 lb

## 2021-10-17 DIAGNOSIS — F5104 Psychophysiologic insomnia: Secondary | ICD-10-CM

## 2021-10-17 DIAGNOSIS — F250 Schizoaffective disorder, bipolar type: Secondary | ICD-10-CM | POA: Diagnosis not present

## 2021-10-17 MED ORDER — QUVIVIQ 25 MG PO TABS: 25.0000 mg | ORAL_TABLET | Freq: Every evening | ORAL | 1 refills | Status: DC

## 2021-10-17 NOTE — Progress Notes (Signed)
Bridget [name]    [id number]    06/03/1956  Primary Care Physician:Koehler, Angelyn Punt, MD  Referring Physician: Janifer Adie, [address] [place],  [place] [address]  Chief complaint:   Patient being seen today for chronic insomnia.    HPI: No changes in health since last time she was here  She did bring again a 2-week sleep diary that reflects she is only been sleeping about 2 to 3 hours of noncontinuous sleep  Occasionally does get about 4 hours of noncontinuous sleep  Has been having multiple joint has been having some neck pain, back pain, knee pain Difficulty sleeping has been for so many years  Has not had any sleep for almost 3 years she stated  Usually goes to lay in the bed about 9 PM, Wake up time about 10 AM  States she does not sleep at all  Brother with sleep issues as well  Not sleepy during the day No dryness of the mouth in the mornings  History of snoring, no history of concern for sleep apnea  Has tried multiple medications in the past that has not helped with sleep and Currently using trazodone which does not help her sleep  She does have a history of schizoaffective disorder-bipolar type, generalized anxiety disorder  Outpatient Encounter Medications as of 07/16/2021  Medication Sig   acetaminophen (TYLENOL 8 HOUR) 650 MG CR tablet Take 1,300 mg by mouth daily as needed for pain.   DULoxetine (CYMBALTA) 60 MG capsule Take 60 mg by mouth 2 (two) times daily.   esomeprazole (NEXIUM) 40 MG capsule Take 40 mg by mouth daily at 12 noon.   hydrOXYzine (VISTARIL) 25 MG capsule Take 1 capsule (25 mg total) by mouth 3 (three) times daily as needed for anxiety.   meclizine (ANTIVERT) 12.5 MG tablet Take 12.5 mg by mouth 3 (three) times daily.   polyethylene glycol (MIRALAX / GLYCOLAX) packet Take 17 g by mouth daily as needed.   risperiDONE (RISPERDAL) 1 MG tablet Take 1-2 mg by mouth See admin instructions. 1 MG at night for agitation  and 2 MG at bedtime for sleep   rosuvastatin (CRESTOR) 20 MG tablet Take 20 mg by mouth daily.   sodium chloride (MURO 128) 5 % ophthalmic ointment Place 1 application into both eyes 4 (four) times daily as needed for eye irritation.   traZODone (DESYREL) 50 MG tablet Take 50 mg by mouth at bedtime as needed for sleep.   No facility-administered encounter medications on file as of 07/16/2021.    Allergies as of 07/16/2021 - Review Complete 07/16/2021  Allergen Reaction Noted   Amitriptyline Other (See Comments) 09/22/2017   Flexeril [cyclobenzaprine]  09/22/2017   Penicillins Rash 09/22/2017    Past Medical History:  Diagnosis Date   Anxiety    Back pain    low back - arthritis   Barrett's esophagus    Chronic headaches    Constipation    miralax daily   Depression    Diabetes ([place])    per patient, MD took her off medication - no meds   Dry mouth    biotene   Fibromyalgia    GERD (gastroesophageal reflux disease)    Hyperlipemia    Insomnia    Myoclonus    patient states "no longer an issue"   Osteoarthritis    lower back, shoulders, hands   Schizoaffective disorder (HCC)    SVD (spontaneous vaginal delivery)  x 3   Vertigo    Wears dentures    full    Past Surgical History:  Procedure Laterality Date   ABDOMINAL HYSTERECTOMY     BREAST EXCISIONAL BIOPSY Right    BREAST EXCISIONAL BIOPSY Right    BREAST EXCISIONAL BIOPSY Right    BREAST EXCISIONAL BIOPSY Right    BUNIONECTOMY Bilateral    COLONOSCOPY  08/2013   HAND SURGERY Right    carpel tunnel surgery   MULTIPLE TOOTH EXTRACTIONS     full dentures   TRIGGER FINGER RELEASE Bilateral    thumbs    Family History  Problem Relation Age of Onset   Diabetes Mother    Prostate cancer Father    Colon cancer Neg Hx    Colon polyps Neg Hx    Rectal cancer Neg Hx    Stomach cancer Neg Hx     Social History   Socioeconomic History   Marital status: Married    Spouse name: Not on file   Number of  children: Not on file   Years of education: Not on file   Highest education level: Not on file  Occupational History   Not on file  Tobacco Use   Smoking status: Never   Smokeless tobacco: Never  Vaping Use   Vaping Use: Never used  Substance and Sexual Activity   Alcohol use: No   Drug use: No   Sexual activity: Never    Birth control/protection: Post-menopausal    Comment: Hysterectomy  Other Topics Concern   Not on file  Social History Narrative   Not on file   Social Determinants of Health   Financial Resource Strain: Not on file  Food Insecurity: Not on file  Transportation Needs: Not on file  Physical Activity: Not on file  Stress: Not on file  Social Connections: Not on file  Intimate Partner Violence: Not on file    Review of Systems  Psychiatric/Behavioral:  Positive for sleep disturbance.   All other systems reviewed and are negative.  Vitals:   07/16/21 1133  BP: 124/78  Pulse: 77  Temp: 97.8 F (36.6 C)  SpO2: 97%     Physical Exam Constitutional:      Appearance: She is obese.  HENT:     Mouth/Throat:     Comments: Mallampati 3, crowded oropharynx Cardiovascular:     Rate and Rhythm: Normal rate and regular rhythm.     Heart sounds: No murmur heard.   No friction rub.  Pulmonary:     Effort: No respiratory distress.     Breath sounds: No stridor. No wheezing or rhonchi.  Musculoskeletal:     Cervical back: No rigidity or tenderness.  Neurological:     Mental Status: She is alert.  Psychiatric:        Mood and Affect: Mood normal.   Epworth Sleepiness Scale of 0   Assessment:  Chronic insomnia  Insomnia related to medical condition -Trial with Lunesta did not help  Sleep log reveals nonconsolidated sleep and hardly sleeping a total  Plan/Recommendations: Will switch to quiviviq -25 mg nightly -May escalate dose if not effective  Tentative follow-up in 4 to 6 weeks  Exercise as tolerated   Sherrilyn Rist MD [place] [facility] 07/16/2021, 11:58 AM  CC: Janifer Adie, MD

## 2021-10-17 NOTE — Patient Instructions (Signed)
Prescription for CHJSCBIP sent to pharmacy for you  This will replace Lunesta  25 mg to be used nightly May increase the dose if this is not effective after a few weeks trial  I will see you back in 4 weeks

## 2021-10-18 ENCOUNTER — Ambulatory Visit (INDEPENDENT_AMBULATORY_CARE_PROVIDER_SITE_OTHER): Payer: Medicare (Managed Care) | Admitting: Orthopaedic Surgery

## 2021-10-18 ENCOUNTER — Encounter: Payer: Self-pay | Admitting: Orthopaedic Surgery

## 2021-10-18 DIAGNOSIS — M75121 Complete rotator cuff tear or rupture of right shoulder, not specified as traumatic: Secondary | ICD-10-CM | POA: Diagnosis not present

## 2021-10-18 NOTE — Progress Notes (Signed)
Office Visit Note   Patient: Bridget [name]           Date of Birth: [birth year]           MRN: [id number] Visit Date: 10/18/2021              Requested by: Janifer Adie, MD [address] [place],  [place] [address] PCP: Janifer Adie, MD   Assessment & Plan: Visit Diagnoses:  1. Nontraumatic complete tear of right rotator cuff     Plan: MRI of the right shoulder shows a full-thickness partial width tear of the supraspinatus with retraction.  No muscle atrophy.  Mild tendinopathy of the biceps tendon.  MRI of the left shoulder shows tendinopathy of the supraspinatus without full-thickness defects.  Mild AC joint arthritis.  Glenohumeral chondromalacia.  These findings were reviewed in detail and given failure of conservative management such as home exercises, physical therapy, injections I have recommended arthroscopic treatment of the rotator cuff tear as well as extensive debridement, biceps tenotomy, subacromial decompression for the right shoulder.  Risk benefits rehab recovery reviewed with the patient in detail.  Questions encouraged and answered.  Follow-Up Instructions: No follow-ups on file.   Orders:  No orders of the defined types were placed in this encounter.  No orders of the defined types were placed in this encounter.     Procedures: No procedures performed   Clinical Data: No additional findings.   Subjective: Chief Complaint  Patient presents with   Right Shoulder - Follow-up    MRI review   Left Shoulder - Follow-up    MRI review    HPI  Ms. Russi returns today to discuss bilateral shoulder MRI studies.  He denies any changes  Review of Systems   Objective: Vital Signs: There were no vitals taken for this visit.  Physical Exam  Ortho Exam  Right shoulder examination shows normal range of motion with moderate pain.  Pain and slight weakness with empty can test.  Specialty Comments:  No specialty comments  available.  Imaging: No results found.   PMFS History: Patient Active Problem List   Diagnosis Date Noted   GAD (generalized anxiety disorder) 01/24/2020   Schizoaffective disorder, bipolar type ([place]) 02/04/2018   Past Medical History:  Diagnosis Date   Anxiety    Back pain    low back - arthritis   Barrett's esophagus    Chronic headaches    Constipation    miralax daily   Depression    Diabetes ([place])    per patient, MD took her off medication - no meds   Dry mouth    biotene   Fibromyalgia    GERD (gastroesophageal reflux disease)    Hyperlipemia    Insomnia    Myoclonus    patient states "no longer an issue"   Osteoarthritis    lower back, shoulders, hands   Schizoaffective disorder (HCC)    SVD (spontaneous vaginal delivery)    x 3   Vertigo    Wears dentures    full    Family History  Problem Relation Age of Onset   Diabetes Mother    Prostate cancer Father    Colon cancer Neg Hx    Colon polyps Neg Hx    Rectal cancer Neg Hx    Stomach cancer Neg Hx     Past Surgical History:  Procedure Laterality Date   ABDOMINAL HYSTERECTOMY     BREAST EXCISIONAL BIOPSY Right  BREAST EXCISIONAL BIOPSY Right    BREAST EXCISIONAL BIOPSY Right    BREAST EXCISIONAL BIOPSY Right    BUNIONECTOMY Bilateral    COLONOSCOPY  08/2013   HAND SURGERY Right    carpel tunnel surgery   MULTIPLE TOOTH EXTRACTIONS     full dentures   TRIGGER FINGER RELEASE Bilateral    thumbs   Social History   Occupational History   Not on file  Tobacco Use   Smoking status: Never   Smokeless tobacco: Never  Vaping Use   Vaping Use: Never used  Substance and Sexual Activity   Alcohol use: No   Drug use: No   Sexual activity: Never    Birth control/protection: Post-menopausal    Comment: Hysterectomy

## 2021-10-23 ENCOUNTER — Other Ambulatory Visit: Payer: Self-pay

## 2021-10-23 ENCOUNTER — Encounter (HOSPITAL_BASED_OUTPATIENT_CLINIC_OR_DEPARTMENT_OTHER): Payer: Self-pay | Admitting: Orthopaedic Surgery

## 2021-10-25 ENCOUNTER — Ambulatory Visit: Payer: Medicare (Managed Care) | Admitting: Orthopaedic Surgery

## 2021-10-30 ENCOUNTER — Other Ambulatory Visit: Payer: Self-pay

## 2021-10-30 ENCOUNTER — Ambulatory Visit: Payer: Medicare (Managed Care) | Admitting: Orthopaedic Surgery

## 2021-10-30 ENCOUNTER — Ambulatory Visit (HOSPITAL_BASED_OUTPATIENT_CLINIC_OR_DEPARTMENT_OTHER): Payer: Medicare (Managed Care) | Admitting: Certified Registered"

## 2021-10-30 ENCOUNTER — Ambulatory Visit (HOSPITAL_BASED_OUTPATIENT_CLINIC_OR_DEPARTMENT_OTHER)
Admission: RE | Admit: 2021-10-30 | Discharge: 2021-10-30 | Disposition: A | Discharge status: Home / Self Care | Payer: Medicare (Managed Care) | Attending: Orthopaedic Surgery | Admitting: Orthopaedic Surgery

## 2021-10-30 ENCOUNTER — Encounter (HOSPITAL_BASED_OUTPATIENT_CLINIC_OR_DEPARTMENT_OTHER)
Admission: RE | Disposition: A | Discharge status: Home / Self Care | Payer: Self-pay | Source: Home / Self Care | Attending: Orthopaedic Surgery

## 2021-10-30 ENCOUNTER — Encounter (HOSPITAL_BASED_OUTPATIENT_CLINIC_OR_DEPARTMENT_OTHER): Payer: Self-pay | Admitting: Orthopaedic Surgery

## 2021-10-30 ENCOUNTER — Encounter: Payer: Self-pay | Admitting: Orthopaedic Surgery

## 2021-10-30 DIAGNOSIS — K219 Gastro-esophageal reflux disease without esophagitis: Secondary | ICD-10-CM | POA: Insufficient documentation

## 2021-10-30 DIAGNOSIS — M797 Fibromyalgia: Secondary | ICD-10-CM | POA: Insufficient documentation

## 2021-10-30 DIAGNOSIS — M1990 Unspecified osteoarthritis, unspecified site: Secondary | ICD-10-CM | POA: Diagnosis not present

## 2021-10-30 DIAGNOSIS — E785 Hyperlipidemia, unspecified: Secondary | ICD-10-CM | POA: Insufficient documentation

## 2021-10-30 DIAGNOSIS — M7551 Bursitis of right shoulder: Secondary | ICD-10-CM | POA: Insufficient documentation

## 2021-10-30 DIAGNOSIS — M75121 Complete rotator cuff tear or rupture of right shoulder, not specified as traumatic: Secondary | ICD-10-CM

## 2021-10-30 DIAGNOSIS — F419 Anxiety disorder, unspecified: Secondary | ICD-10-CM | POA: Insufficient documentation

## 2021-10-30 DIAGNOSIS — G709 Myoneural disorder, unspecified: Secondary | ICD-10-CM | POA: Insufficient documentation

## 2021-10-30 DIAGNOSIS — F319 Bipolar disorder, unspecified: Secondary | ICD-10-CM | POA: Insufficient documentation

## 2021-10-30 HISTORY — DX: Complete rotator cuff tear or rupture of right shoulder, not specified as traumatic: M75.121

## 2021-10-30 HISTORY — PX: SHOULDER ARTHROSCOPY WITH ROTATOR CUFF REPAIR AND SUBACROMIAL DECOMPRESSION: SHX5686

## 2021-10-30 HISTORY — DX: Cardiac murmur, unspecified: R01.1

## 2021-10-30 HISTORY — PX: INJECTION KNEE: SHX2446

## 2021-10-30 SURGERY — SHOULDER ARTHROSCOPY WITH ROTATOR CUFF REPAIR AND SUBACROMIAL DECOMPRESSION
Anesthesia: Regional | Site: Shoulder | Laterality: Right

## 2021-10-30 MED ORDER — METHYLPREDNISOLONE ACETATE 40 MG/ML IJ SUSP
INTRAMUSCULAR | Status: DC | PRN
  Administered 2021-10-30: 5 mL via INTRAMUSCULAR

## 2021-10-30 MED ORDER — FENTANYL CITRATE (PF) 100 MCG/2ML IJ SOLN
INTRAMUSCULAR | Status: DC | PRN
  Administered 2021-10-30: 25 ug via INTRAVENOUS

## 2021-10-30 MED ORDER — EPHEDRINE SULFATE 50 MG/ML IJ SOLN
INTRAMUSCULAR | Status: DC | PRN
  Administered 2021-10-30: 10 mg via INTRAVENOUS

## 2021-10-30 MED ORDER — SODIUM CHLORIDE 0.9 % IR SOLN
Status: DC | PRN
  Administered 2021-10-30: 7000 mL

## 2021-10-30 MED ORDER — MIDAZOLAM HCL 2 MG/2ML IJ SOLN
INTRAMUSCULAR | Status: AC
  Filled 2021-10-30: qty 2

## 2021-10-30 MED ORDER — CEFAZOLIN SODIUM-DEXTROSE 2-4 GM/100ML-% IV SOLN
2.0000 g | INTRAVENOUS | Status: AC
  Administered 2021-10-30: 2 g via INTRAVENOUS

## 2021-10-30 MED ORDER — LIDOCAINE HCL (CARDIAC) PF 100 MG/5ML IV SOSY
PREFILLED_SYRINGE | INTRAVENOUS | Status: DC | PRN
  Administered 2021-10-30: 30 mg via INTRAVENOUS

## 2021-10-30 MED ORDER — ROCURONIUM BROMIDE 100 MG/10ML IV SOLN
INTRAVENOUS | Status: DC | PRN
  Administered 2021-10-30: 70 mg via INTRAVENOUS

## 2021-10-30 MED ORDER — OXYCODONE-ACETAMINOPHEN 5-325 MG PO TABS: 1.0000 | ORAL_TABLET | Freq: Three times a day (TID) | ORAL | 0 refills | Status: DC | PRN

## 2021-10-30 MED ORDER — MIDAZOLAM HCL 2 MG/2ML IJ SOLN
1.0000 mg | Freq: Once | INTRAMUSCULAR | Status: AC
  Administered 2021-10-30: 1 mg via INTRAVENOUS

## 2021-10-30 MED ORDER — FENTANYL CITRATE (PF) 100 MCG/2ML IJ SOLN: 25.0000 ug | INTRAMUSCULAR | Status: DC | PRN

## 2021-10-30 MED ORDER — SUGAMMADEX SODIUM 200 MG/2ML IV SOLN
INTRAVENOUS | Status: DC | PRN
  Administered 2021-10-30: 200 mg via INTRAVENOUS

## 2021-10-30 MED ORDER — FENTANYL CITRATE (PF) 100 MCG/2ML IJ SOLN
50.0000 ug | Freq: Once | INTRAMUSCULAR | Status: AC
  Administered 2021-10-30: 50 ug via INTRAVENOUS

## 2021-10-30 MED ORDER — ACETAMINOPHEN 10 MG/ML IV SOLN: 1000.0000 mg | Freq: Once | INTRAVENOUS | Status: DC | PRN

## 2021-10-30 MED ORDER — BUPIVACAINE HCL (PF) 0.25 % IJ SOLN
INTRAMUSCULAR | Status: AC
  Filled 2021-10-30: qty 30

## 2021-10-30 MED ORDER — DEXAMETHASONE SODIUM PHOSPHATE 4 MG/ML IJ SOLN
INTRAMUSCULAR | Status: DC | PRN
  Administered 2021-10-30: 4 mg via INTRAVENOUS

## 2021-10-30 MED ORDER — BUPIVACAINE LIPOSOME 1.3 % IJ SUSP
INTRAMUSCULAR | Status: DC | PRN
  Administered 2021-10-30: 10 mL via PERINEURAL

## 2021-10-30 MED ORDER — AMISULPRIDE (ANTIEMETIC) 5 MG/2ML IV SOLN: 10.0000 mg | Freq: Once | INTRAVENOUS | Status: DC | PRN

## 2021-10-30 MED ORDER — KETOROLAC TROMETHAMINE 15 MG/ML IJ SOLN: 15.0000 mg | Freq: Once | INTRAMUSCULAR | Status: DC | PRN

## 2021-10-30 MED ORDER — PHENYLEPHRINE HCL-NACL 20-0.9 MG/250ML-% IV SOLN
INTRAVENOUS | Status: DC | PRN
  Administered 2021-10-30: 40 ug/min via INTRAVENOUS

## 2021-10-30 MED ORDER — FENTANYL CITRATE (PF) 100 MCG/2ML IJ SOLN
INTRAMUSCULAR | Status: AC
  Filled 2021-10-30: qty 2

## 2021-10-30 MED ORDER — BUPIVACAINE-EPINEPHRINE 0.25% -1:200000 IJ SOLN
INTRAMUSCULAR | Status: DC | PRN
  Administered 2021-10-30: 20 mL

## 2021-10-30 MED ORDER — BUPIVACAINE HCL (PF) 0.5 % IJ SOLN
INTRAMUSCULAR | Status: DC | PRN
  Administered 2021-10-30: 15 mL via PERINEURAL

## 2021-10-30 MED ORDER — PHENYLEPHRINE HCL (PRESSORS) 10 MG/ML IV SOLN
INTRAVENOUS | Status: DC | PRN
  Administered 2021-10-30: 40 ug via INTRAVENOUS
  Administered 2021-10-30: 80 ug via INTRAVENOUS
  Administered 2021-10-30 (×2): 40 ug via INTRAVENOUS

## 2021-10-30 MED ORDER — ONDANSETRON HCL 4 MG/2ML IJ SOLN: 4.0000 mg | Freq: Once | INTRAMUSCULAR | Status: DC | PRN

## 2021-10-30 MED ORDER — PROPOFOL 10 MG/ML IV BOLUS
INTRAVENOUS | Status: DC | PRN
  Administered 2021-10-30: 150 mg via INTRAVENOUS

## 2021-10-30 MED ORDER — CEFAZOLIN SODIUM-DEXTROSE 2-4 GM/100ML-% IV SOLN
INTRAVENOUS | Status: AC
  Filled 2021-10-30: qty 100

## 2021-10-30 MED ORDER — METHYLPREDNISOLONE ACETATE 40 MG/ML IJ SUSP
INTRAMUSCULAR | Status: AC
  Filled 2021-10-30: qty 2

## 2021-10-30 MED ORDER — LACTATED RINGERS IV SOLN: INTRAVENOUS | Status: DC

## 2021-10-30 MED ORDER — ONDANSETRON HCL 4 MG/2ML IJ SOLN
INTRAMUSCULAR | Status: DC | PRN
  Administered 2021-10-30: 4 mg via INTRAVENOUS

## 2021-10-30 SURGICAL SUPPLY — 67 items
ANCH SUT KNTLS SLF PNCH MTL (Anchor) ×2 IMPLANT
APL SKNCLS STERI-STRIP NONHPOA (GAUZE/BANDAGES/DRESSINGS)
BENZOIN TINCTURE PRP APPL 2/3 (GAUZE/BANDAGES/DRESSINGS) IMPLANT
BLADE EXCALIBUR 4.0MM X 13CM (MISCELLANEOUS)
BLADE EXCALIBUR 4.0X13 (MISCELLANEOUS) IMPLANT
BLADE SURG 15 STRL LF DISP TIS (BLADE) IMPLANT
BLADE SURG 15 STRL SS (BLADE)
BURR OVAL 8 FLU 4.0MM X 13CM (MISCELLANEOUS) ×1
BURR OVAL 8 FLU 4.0X13 (MISCELLANEOUS) ×3 IMPLANT
CANNULA 5.75X71 LONG (CANNULA) ×4 IMPLANT
CANNULA SHOULDER 7CM (CANNULA) IMPLANT
CANNULA TWIST IN 8.25X7CM (CANNULA) ×4 IMPLANT
CLOSURE STERI-STRIP 1/2X4 (GAUZE/BANDAGES/DRESSINGS)
CLSR STERI-STRIP ANTIMIC 1/2X4 (GAUZE/BANDAGES/DRESSINGS) IMPLANT
COOLER ICEMAN CLASSIC (MISCELLANEOUS) ×4 IMPLANT
DECANTER SPIKE VIAL GLASS SM (MISCELLANEOUS) IMPLANT
DISSECTOR  3.8MM X 13CM (MISCELLANEOUS) ×4
DISSECTOR 3.8MM X 13CM (MISCELLANEOUS) ×2 IMPLANT
DRAPE IMP U-DRAPE 54X76 (DRAPES) ×4 IMPLANT
DRAPE INCISE IOBAN 66X45 STRL (DRAPES) ×4 IMPLANT
DRAPE STERI 35X30 U-POUCH (DRAPES) ×4 IMPLANT
DRAPE SURG 17X23 STRL (DRAPES) IMPLANT
DRAPE U-SHAPE 47X51 STRL (DRAPES) ×8 IMPLANT
DRAPE U-SHAPE 76X120 STRL (DRAPES) ×8 IMPLANT
DRSG PAD ABDOMINAL 8X10 ST (GAUZE/BANDAGES/DRESSINGS) ×8 IMPLANT
DURAPREP 26ML APPLICATOR (WOUND CARE) ×4 IMPLANT
ELECT REM PT RETURN 9FT ADLT (ELECTROSURGICAL)
ELECTRODE REM PT RTRN 9FT ADLT (ELECTROSURGICAL) IMPLANT
GAUZE SPONGE 4X4 12PLY STRL (GAUZE/BANDAGES/DRESSINGS) ×8 IMPLANT
GAUZE XEROFORM 1X8 LF (GAUZE/BANDAGES/DRESSINGS) ×4 IMPLANT
GLOVE SURG NEOP MICRO LF SZ7.5 (GLOVE) ×4 IMPLANT
GLOVE SURG POLYISO LF SZ6.5 (GLOVE) ×4 IMPLANT
GLOVE SURG SYN 7.5  E (GLOVE) ×4
GLOVE SURG SYN 7.5 E (GLOVE) ×2 IMPLANT
GLOVE SURG UNDER POLY LF SZ7 (GLOVE) ×12 IMPLANT
GLOVE SURG UNDER POLY LF SZ7.5 (GLOVE) ×4 IMPLANT
GOWN STRL REIN XL XLG (GOWN DISPOSABLE) ×8 IMPLANT
GOWN STRL REUS W/ TWL LRG LVL3 (GOWN DISPOSABLE) ×2 IMPLANT
GOWN STRL REUS W/TWL LRG LVL3 (GOWN DISPOSABLE) ×4
IMPL ANCHOR PEEK 4.5MM (Anchor) ×2 IMPLANT
IMPLANT ANCHOR PEEK 4.5MM (Anchor) ×4 IMPLANT
MANIFOLD NEPTUNE II (INSTRUMENTS) ×4 IMPLANT
NEEDLE SCORPION MULTI FIRE (NEEDLE) IMPLANT
NEEDLE SUT PASSER RTC (NEEDLE) ×4 IMPLANT
PACK ARTHROSCOPY DSU (CUSTOM PROCEDURE TRAY) ×4 IMPLANT
PACK BASIN DAY SURGERY FS (CUSTOM PROCEDURE TRAY) ×4 IMPLANT
PAD COLD SHLDR WRAP-ON (PAD) ×4 IMPLANT
PORT APPOLLO RF 90DEGREE MULTI (SURGICAL WAND) ×4 IMPLANT
SHEET MEDIUM DRAPE 40X70 STRL (DRAPES) ×4 IMPLANT
SLEEVE SCD COMPRESS KNEE MED (STOCKING) ×4 IMPLANT
SLING ARM FOAM STRAP LRG (SOFTGOODS) ×4 IMPLANT
SUT BROADBAND TAPE 2PK 1.5 (SUTURE) ×4 IMPLANT
SUT ETHILON 3 0 PS 1 (SUTURE) ×8 IMPLANT
SUT FIBERWIRE #2 38 T-5 BLUE (SUTURE)
SUT PDS AB 2-0 CT2 27 (SUTURE) ×4 IMPLANT
SUT TIGER TAPE 7 IN WHITE (SUTURE) IMPLANT
SUTURE FIBERWR #2 38 T-5 BLUE (SUTURE) IMPLANT
SUTURE TAPE 1.3 40 TPR END (SUTURE) IMPLANT
SUTURE TAPE TIGERLINK 1.3MM BL (SUTURE) IMPLANT
SUTURETAPE 1.3 40 TPR END (SUTURE)
SUTURETAPE TIGERLINK 1.3MM BL (SUTURE)
SYR 50ML LL SCALE MARK (SYRINGE) ×4 IMPLANT
TAPE FIBER 2MM 7IN #2 BLUE (SUTURE) IMPLANT
TOWEL GREEN STERILE FF (TOWEL DISPOSABLE) ×4 IMPLANT
TUBE CONNECTING 20'X1/4 (TUBING) ×1
TUBE CONNECTING 20X1/4 (TUBING) ×3 IMPLANT
TUBING ARTHROSCOPY IRRIG 16FT (MISCELLANEOUS) ×4 IMPLANT

## 2021-10-30 NOTE — Anesthesia Procedure Notes (Signed)
Procedure Name: Intubation Date/Time: 10/30/2021 1:58 PM Performed by: Signe Colt, CRNA Pre-anesthesia Checklist: Patient identified, Emergency Drugs available, Suction available and Patient being monitored Patient Re-evaluated:Patient Re-evaluated prior to induction Oxygen Delivery Method: Circle system utilized Preoxygenation: Pre-oxygenation with 100% oxygen Induction Type: IV induction Ventilation: Mask ventilation without difficulty Laryngoscope Size: Mac and 3 Grade View: Grade I Tube type: Oral Tube size: 7.0 mm Number of attempts: 1 Airway Equipment and Method: Stylet and Oral airway Placement Confirmation: ETT inserted through vocal cords under direct vision, positive ETCO2 and breath sounds checked- equal and bilateral Secured at: 21 cm Tube secured with: Tape Dental Injury: Teeth and Oropharynx as per pre-operative assessment

## 2021-10-30 NOTE — Anesthesia Postprocedure Evaluation (Signed)
Anesthesia Post Note  Patient: Bridget [name]  Procedure(s) Performed: RIGHT SHOULDER ARTHROSCOPY WITH ROTATOR CUFF REPAIR AND SUBACROMIAL DECOMPRESSION, DEBRIDEMENT (Right: Shoulder) BILATERAL KNEE INJECTIONS (Bilateral: Knee)     Patient location during evaluation: PACU Anesthesia Type: Regional Level of consciousness: awake and alert Pain management: pain level controlled Vital Signs Assessment: post-procedure vital signs reviewed and stable Respiratory status: spontaneous breathing, nonlabored ventilation and respiratory function stable Cardiovascular status: blood pressure returned to baseline and stable Postop Assessment: no apparent nausea or vomiting Anesthetic complications: no   No notable events documented.  Last Vitals:  Vitals:   10/30/21 1545 10/30/21 1555  BP: (!) 143/83 (!) 152/91  Pulse: 100 (!) 105  Resp: 18 18  Temp:  36.6 C  SpO2: 96% 96%    Last Pain:  Vitals:   10/30/21 1555  TempSrc:   PainSc: 0-No pain                 Lidia Collum

## 2021-10-30 NOTE — Transfer of Care (Signed)
Immediate Anesthesia Transfer of Care Note  Patient: Bridget [name]  Procedure(s) Performed: RIGHT SHOULDER ARTHROSCOPY WITH ROTATOR CUFF REPAIR AND SUBACROMIAL DECOMPRESSION, DEBRIDEMENT (Right: Shoulder) BILATERAL KNEE INJECTIONS (Bilateral: Knee)  Patient Location: PACU  Anesthesia Type:GA combined with regional for post-op pain  Level of Consciousness: awake, alert , oriented and patient cooperative  Airway & Oxygen Therapy: Patient Spontanous Breathing and Patient connected to face mask oxygen  Post-op Assessment: Report given to RN and Post -op Vital signs reviewed and stable  Post vital signs: Reviewed and stable  Last Vitals:  Vitals Value Taken Time  BP 142/81 10/30/21 1525  Temp    Pulse 90 10/30/21 1526  Resp 16 10/30/21 1526  SpO2 100 % 10/30/21 1526  Vitals shown include unvalidated device data.  Last Pain:  Vitals:   10/30/21 1141  TempSrc: Oral  PainSc: 7       Patients Stated Pain Goal: 4 (76/54/86 8852)  Complications: No notable events documented.

## 2021-10-30 NOTE — Op Note (Signed)
Date of Surgery: 10/30/2021  INDICATIONS: The patient is a 65 year old female with right shoulder pain that has failed conservative treatment;  The patient did consent to the procedure after discussion of the risks and benefits.  PREOPERATIVE DIAGNOSIS:  1.  Full-thickness tear of right supraspinatus tendon 2.  Right long head biceps tendinopathy 3.  Right subacromial bursitis and impingement syndrome 4.  Right degenerative labral tear  POSTOPERATIVE DIAGNOSIS: Same.  PROCEDURE:  1.  Arthroscopic right shoulder rotator cuff repair of supraspinatus tendon 2.  Arthroscopic right shoulder extensive debridement of labrum, rotator cuff, biceps tenotomy, bursectomy 3.  Arthroscopic right shoulder subacromial decompression with acromioplasty and CA ligament release  SURGEON: N. Eduard Roux, M.D.  ASSIST: Madalyn Rob, PA-C  ANESTHESIA:  general, regional  IV FLUIDS AND URINE: See anesthesia.  ESTIMATED BLOOD LOSS: minimal mL.  IMPLANTS: [place] 4.5 mm self punching anchor  COMPLICATIONS: None.  DESCRIPTION OF PROCEDURE: The patient was brought to the operating room and placed supine on the operating table.  The patient had been signed prior to the procedure and this was documented. The patient had the anesthesia placed by the anesthesiologist.  A time-out was performed to confirm that this was the correct patient, site, side and location. The patient did receive antibiotics prior to the incision and was re-dosed during the procedure as needed at indicated intervals.  The patient was then positioned into the beach chair position with all bony prominences well padded and neutral C spine. The patient had the operative extremity prepped and draped in the standard surgical fashion.    Shoulder arthroscopy portals were made.  Diagnostic shoulder arthroscopy revealed mild synovitis of the labrum.  There was degenerative labral tearing.  This was gently debrided.  There was moderate  biceps tendinopathy.  Biceps tenotomy was performed.  The stump was debrided.  The articular surface of the rotator cuff was grossly intact.  The area of significant partial-thickness tearing was marked with a PDS suture.  The glenohumeral surface was relatively unremarkable.  The arthroscope was then placed into the subacromial space and there was thick subdeltoid and subacromial bursitis.  Bursectomy was performed.  Subacromial decompression with acromioplasty was performed.  CA ligament was released.  We then placed the arm in external rotation and abduction to find the area of the cuff that was marked by the PDS suture and after debridement of the bursa we encountered a full-thickness crescent-shaped tear of the supraspinatus tendon from the greater tuberosity.  The tendon was gently debrided with oscillating shaver back to stable margins.  The greater tuberosity bone was abraded with a high-speed bur down to bleeding bone.  We then used to fiber tapes in an inverted mattress fashion which produced 4 strands.  This was anchored down to a 4.5 mm self punching anchor in the proximal humerus.  The tendon reduced well back to the footprint.  Watertight closure was achieved.  AC joint was unremarkable.  Excess fluid was removed from the shoulder joint.  Incisions closed with interrupted nylon sutures.  Sterile dressings were applied.  Shoulder immobilizer with abduction pillow was placed.  Patient tolerated procedure well had no many complications. Tawanna Cooler, my PA, was a medical necessity for the entirety of the surgery including opening, closing, limb positioning, retracting, exposing, and repairing.  POSTOPERATIVE PLAN: Patient will be discharged home and follow-up in 1 week for suture removal and initiation of physical therapy.  She is to wear the sling at all times.  Ashok Cordia  Erlinda Hong, MD [facility] [phone] 3:10 PM

## 2021-10-30 NOTE — Anesthesia Procedure Notes (Signed)
Anesthesia Regional Block: Interscalene brachial plexus block   Pre-Anesthetic Checklist: , timeout performed,  Correct Patient, Correct Site, Correct Laterality,  Correct Procedure, Correct Position, site marked,  Risks and benefits discussed,  Surgical consent,  Pre-op evaluation,  At surgeon's request and post-op pain management  Laterality: Right  Prep: chloraprep       Needles:  Injection technique: Single-shot  Needle Type: Echogenic Stimulator Needle     Needle Length: 9cm  Needle Gauge: 21     Additional Needles:   Procedures:,,,, ultrasound used (permanent image in chart),,    Narrative:  Start time: 10/30/2021 1:10 PM End time: 10/30/2021 1:20 PM Injection made incrementally with aspirations every 5 mL.  Performed by: Personally  Anesthesiologist: Murvin Natal, MD  Additional Notes: Functioning IV was confirmed and monitors were applied.  A timeout was performed. Sterile prep, hand hygiene and sterile gloves were used. A 34mm 21ga Arrow echogenic stimulator needle was used. Negative aspiration and negative test dose prior to incremental administration of local anesthetic. The patient tolerated the procedure well.  Ultrasound guidance: relevent anatomy identified, needle position confirmed, local anesthetic spread visualized around nerve(s), vascular puncture avoided.  Image printed for medical record.

## 2021-10-30 NOTE — Progress Notes (Signed)
Assisted Dr. Roanna Banning with right, ultrasound guided, interscalene  block. Side rails up, monitors on throughout procedure. See vital signs in flow sheet. Tolerated Procedure well.

## 2021-10-30 NOTE — H&P (Signed)
PREOPERATIVE H&P  Chief Complaint: right shoulder supraspinatus tear, impingement  HPI: Bridget [name] is a 65 y.o. female who presents for surgical treatment of right shoulder supraspinatus tear, impingement.  She denies any changes in medical history.  Past Medical History:  Diagnosis Date   Anxiety    Back pain    low back - arthritis   Barrett's esophagus    Chronic headaches    Constipation    miralax daily   Depression    Dry mouth    biotene   Fibromyalgia    GERD (gastroesophageal reflux disease)    Hyperlipemia    Insomnia    Myoclonus    patient states "no longer an issue"   Osteoarthritis    lower back, shoulders, hands   Schizoaffective disorder (HCC)    SVD (spontaneous vaginal delivery)    x 3   Vertigo    Wears dentures    full   Past Surgical History:  Procedure Laterality Date   ABDOMINAL HYSTERECTOMY     BREAST EXCISIONAL BIOPSY Right    BREAST EXCISIONAL BIOPSY Right    BREAST EXCISIONAL BIOPSY Right    BREAST EXCISIONAL BIOPSY Right    BUNIONECTOMY Bilateral    COLONOSCOPY  08/2013   HAND SURGERY Right    carpel tunnel surgery   MULTIPLE TOOTH EXTRACTIONS     full dentures   TRIGGER FINGER RELEASE Bilateral    thumbs   Social History   Socioeconomic History   Marital status: Married    Spouse name: Not on file   Number of children: Not on file   Years of education: Not on file   Highest education level: Not on file  Occupational History   Not on file  Tobacco Use   Smoking status: Never   Smokeless tobacco: Never  Vaping Use   Vaping Use: Never used  Substance and Sexual Activity   Alcohol use: No   Drug use: No   Sexual activity: Not Currently    Birth control/protection: Post-menopausal    Comment: Hysterectomy  Other Topics Concern   Not on file  Social History Narrative   Not on file   Social Determinants of Health   Financial Resource Strain: Not on file  Food Insecurity: Not on file  Transportation  Needs: Not on file  Physical Activity: Not on file  Stress: Not on file  Social Connections: Not on file   Family History  Problem Relation Age of Onset   Diabetes Mother    Prostate cancer Father    Colon cancer Neg Hx    Colon polyps Neg Hx    Rectal cancer Neg Hx    Stomach cancer Neg Hx    Allergies  Allergen Reactions   Amitriptyline Other (See Comments)    Urinary retention   Flexeril [Cyclobenzaprine]     Urinary retention   Penicillins Rash    Has patient had a PCN reaction causing immediate rash, facial/tongue/throat swelling, SOB or lightheadedness with hypotension: yes Has patient had a PCN reaction causing severe rash involving mucus membranes or skin necrosis: no Has patient had a PCN reaction that required hospitalization: No Has patient had a PCN reaction occurring within the last 10 years: No If all of the above answers are "NO", then may proceed with Cephalosporin use.    Prior to Admission medications   Medication Sig Start Date End Date Taking? Authorizing Provider  ARIPiprazole (ABILIFY) 10 MG tablet Take 10 mg by mouth daily.  Yes [provider]  celecoxib (CELEBREX) 200 MG capsule Take 200 mg by mouth daily.   Yes [provider]  DULoxetine (CYMBALTA) 60 MG capsule Take 60 mg by mouth 2 (two) times daily.   Yes [provider]  esomeprazole (NEXIUM) 40 MG capsule Take 40 mg by mouth daily at 12 noon.   Yes [provider]  hydrOXYzine (VISTARIL) 25 MG capsule Take 1 capsule (25 mg total) by mouth 3 (three) times daily as needed for anxiety. 01/24/20  Yes Plunkett, Loree Fee, MD  LORazepam (ATIVAN) 0.5 MG tablet Take 0.5 mg by mouth 2 (two) times daily.   Yes [provider]  methocarbamol (ROBAXIN) 500 MG tablet Take 500 mg by mouth 3 (three) times daily.   Yes [provider]  polyethylene glycol (MIRALAX / GLYCOLAX) packet Take 17 g by mouth daily as needed.   Yes [provider]  risperiDONE  (RISPERDAL) 1 MG tablet Take 1-2 mg by mouth See admin instructions. 1 MG at night for agitation and 2 MG at bedtime for sleep   Yes [provider]  rosuvastatin (CRESTOR) 20 MG tablet Take 20 mg by mouth daily.   Yes [provider]  sodium chloride (MURO 128) 5 % ophthalmic ointment Place 1 application into both eyes 4 (four) times daily as needed for eye irritation.   Yes [provider]  traMADol (ULTRAM) 50 MG tablet Take 1-2 tablets (50-100 mg total) by mouth daily as needed. 08/27/21  Yes Leandrew Koyanagi, MD  Daridorexant HCl (QUVIVIQ) 25 MG TABS Take 25 mg by mouth at bedtime. 10/17/21   Laurin Coder, MD     Positive ROS: All other systems have been reviewed and were otherwise negative with the exception of those mentioned in the HPI and as above.  Physical Exam: General: Alert, no acute distress Cardiovascular: No pedal edema Respiratory: No cyanosis, no use of accessory musculature GI: abdomen soft Skin: No lesions in the area of chief complaint Neurologic: Sensation intact distally Psychiatric: Patient is competent for consent with normal mood and affect Lymphatic: no lymphedema  MUSCULOSKELETAL: exam stable  Assessment: right shoulder supraspinatus tear, impingement  Plan: Plan for Procedure(s): RIGHT SHOULDER ARTHROSCOPY WITH ROTATOR CUFF REPAIR AND SUBACROMIAL DECOMPRESSION, DEBRIDEMENT  The risks benefits and alternatives were discussed with the patient including but not limited to the risks of nonoperative treatment, versus surgical intervention including infection, bleeding, nerve injury,  blood clots, cardiopulmonary complications, morbidity, mortality, among others, and they were willing to proceed.   Preoperative templating of the joint replacement has been completed, documented, and submitted to the Operating Room personnel in order to optimize intra-operative equipment management.   Eduard Roux, MD 10/30/2021 11:06 AM

## 2021-10-30 NOTE — Anesthesia Preprocedure Evaluation (Addendum)
Anesthesia Evaluation  Patient identified by MRN, date of birth, ID band Patient awake    Reviewed: Allergy & Precautions, NPO status , Patient's Chart, lab work & pertinent test results  Airway Mallampati: III  TM Distance: >3 FB Neck ROM: Full    Dental  (+) Missing   Pulmonary neg pulmonary ROS,    Pulmonary exam normal breath sounds clear to auscultation       Cardiovascular negative cardio ROS Normal cardiovascular exam Rhythm:Regular Rate:Normal     Neuro/Psych  Headaches, PSYCHIATRIC DISORDERS Anxiety Depression Bipolar Disorder Schizophrenia  Neuromuscular disease    GI/Hepatic Neg liver ROS, GERD  Medicated and Controlled,  Endo/Other  negative endocrine ROS  Renal/GU negative Renal ROS     Musculoskeletal  (+) Arthritis , Fibromyalgia -  Abdominal (+) + obese,   Peds  Hematology HLD   Anesthesia Other Findings Right shoulder supraspinatus tear, impingement  Reproductive/Obstetrics                            Anesthesia Physical Anesthesia Plan  ASA: 3  Anesthesia Plan: General and Regional   Post-op Pain Management: GA combined w/ Regional for post-op pain   Induction: Intravenous  PONV Risk Score and Plan: 3 and Ondansetron, Dexamethasone, Midazolam and Treatment may vary due to age or medical condition  Airway Management Planned: Oral ETT  Additional Equipment:   Intra-op Plan:   Post-operative Plan: Extubation in OR  Informed Consent: I have reviewed the patients History and Physical, chart, labs and discussed the procedure including the risks, benefits and alternatives for the proposed anesthesia with the patient or authorized representative who has indicated his/her understanding and acceptance.     Dental advisory given  Plan Discussed with: CRNA  Anesthesia Plan Comments:        Anesthesia Quick Evaluation

## 2021-10-30 NOTE — Discharge Instructions (Addendum)
Post-operative patient instructions  Shoulder Arthroscopy   Ice:  Place intermittent ice or cooler pack over your shoulder, 30 minutes on and 30 minutes off.  Continue this for the first 72 hours after surgery, then save ice for use after therapy sessions or on more active days.   Weight:  You may NOT bear weight on your arm as your symptoms allow. Dressing:  Perform 1st dressing change at 2 days postoperative. A moderate amount of blood tinged drainage is to be expected.  So if you bleed through the dressing on the first or second day or if you have fevers, it is fine to change the dressing/check the wounds early and redress wound.  If it bleeds through again, or if the incisions are leaking frank blood, please call the office. May change dressing every 1-2 days thereafter to help watch wounds. Can purchase Tegaderm (or 54M Nexcare) water resistant dressings at local pharmacy / [facility]. Shower:  Light shower is ok after 2 days.  Please take shower, NO bath. Recover with gauze and ace wrap to help keep wounds protected.   Pain medication:  A narcotic pain medication has been prescribed.  Take as directed.  Typically you need narcotic pain medication more regularly during the first 3 to 5 days after surgery.  Decrease your use of the medication as the pain improves.  Narcotics can sometimes cause constipation, even after a few doses.  If you have problems with constipation, you can take an over the counter stool softener or light laxative.  If you have persistent problems, please notify your physician's office. Physical therapy: Additional activity guidelines to be provided by your physician or physical therapist at follow-up visits.  Driving: Do not recommend driving x 2 weeks post surgical, especially if surgery performed on right side. Should not drive while taking narcotic pain medications. It typically takes at least 2 weeks to restore sufficient neuromuscular function for normal reaction times for  driving safety.  Call [phone] for questions or problems. Evenings you will be forwarded to the hospital operator.  Ask for the orthopaedic physician on call. Please call if you experience:    Redness, foul smelling, or persistent drainage from the surgical site  worsening shoulder pain and swelling not responsive to medication  any calf pain and or swelling of the lower leg  temperatures greater than 101.5 F other questions or concerns   Thank you for allowing [place] to be a part of your care.   Post Anesthesia Home Care Instructions  Activity: Get plenty of rest for the remainder of the day. A responsible individual must stay with you for 24 hours following the procedure.  For the next 24 hours, DO NOT: -Drive a car -Paediatric nurse -Drink alcoholic beverages -Take any medication unless instructed by your physician -Make any legal decisions or sign important papers.  Meals: Start with liquid foods such as gelatin or soup. Progress to regular foods as tolerated. Avoid greasy, spicy, heavy foods. If nausea and/or vomiting occur, drink only clear liquids until the nausea and/or vomiting subsides. Call your physician if vomiting continues.  Special Instructions/Symptoms: Your throat may feel dry or sore from the anesthesia or the breathing tube placed in your throat during surgery. If this causes discomfort, gargle with warm salt water. The discomfort should disappear within 24 hours.  If you had a scopolamine patch placed behind your ear for the management of post- operative nausea and/or vomiting:  1. The medication in the patch is effective  for 72 hours, after which it should be removed.  Wrap patch in a tissue and discard in the trash. Wash hands thoroughly with soap and water. 2. You may remove the patch earlier than 72 hours if you experience unpleasant side effects which may include dry mouth, dizziness or visual disturbances. 3. Avoid touching the patch. Wash your hands with  soap and water after contact with the patch.    Regional Anesthesia Blocks  1. Numbness or the inability to move the "blocked" extremity may last from 3-48 hours after placement. The length of time depends on the medication injected and your individual response to the medication. If the numbness is not going away after 48 hours, call your surgeon.  2. The extremity that is blocked will need to be protected until the numbness is gone and the  Strength has returned. Because you cannot feel it, you will need to take extra care to avoid injury. Because it may be weak, you may have difficulty moving it or using it. You may not know what position it is in without looking at it while the block is in effect.  3. For blocks in the legs and feet, returning to weight bearing and walking needs to be done carefully. You will need to wait until the numbness is entirely gone and the strength has returned. You should be able to move your leg and foot normally before you try and bear weight or walk. You will need someone to be with you when you first try to ensure you do not fall and possibly risk injury.  4. Bruising and tenderness at the needle site are common side effects and will resolve in a few days.  5. Persistent numbness or new problems with movement should be communicated to the surgeon or the [facility] [phone] [place] [phone]). Information for Discharge Teaching: EXPAREL (bupivacaine liposome injectable suspension)   Your surgeon or anesthesiologist gave you EXPAREL(bupivacaine) to help control your pain after surgery.  EXPAREL is a local anesthetic that provides pain relief by numbing the tissue around the surgical site. EXPAREL is designed to release pain medication over time and can control pain for up to 72 hours. Depending on how you respond to EXPAREL, you may require less pain medication during your recovery.  Possible side effects: Temporary loss of  sensation or ability to move in the area where bupivacaine was injected. Nausea, vomiting, constipation Rarely, numbness and tingling in your mouth or lips, lightheadedness, or anxiety may occur. Call your doctor right away if you think you may be experiencing any of these sensations, or if you have other questions regarding possible side effects.  Follow all other discharge instructions given to you by your surgeon or nurse. Eat a healthy diet and drink plenty of water or other fluids.  If you return to the hospital for any reason within 96 hours following the administration of EXPAREL, it is important for health care providers to know that you have received this anesthetic. A teal colored band has been placed on your arm with the date, time and amount of EXPAREL you have received in order to alert and inform your health care providers. Please leave this armband in place for the full 96 hours following administration, and then you may remove the band.

## 2021-10-30 NOTE — Progress Notes (Signed)
Patient left without ice man machine, attempting to call patient.

## 2021-10-31 ENCOUNTER — Encounter (HOSPITAL_BASED_OUTPATIENT_CLINIC_OR_DEPARTMENT_OTHER): Payer: Self-pay | Admitting: Orthopaedic Surgery

## 2021-10-31 ENCOUNTER — Telehealth: Payer: Self-pay | Admitting: Orthopaedic Surgery

## 2021-10-31 NOTE — Telephone Encounter (Signed)
Patient daughter Bridget [name] called advised her mother did not come home with the ice machine for her shoulder. [name] asked how can her mother get one?  The number to contact [name] is [phone]

## 2021-10-31 NOTE — Telephone Encounter (Signed)
It's probably too late at this point to get one

## 2021-11-01 NOTE — Telephone Encounter (Signed)
Patient aware.

## 2021-11-06 ENCOUNTER — Ambulatory Visit (INDEPENDENT_AMBULATORY_CARE_PROVIDER_SITE_OTHER): Payer: Medicare (Managed Care) | Admitting: Physician Assistant

## 2021-11-06 ENCOUNTER — Other Ambulatory Visit: Payer: Self-pay

## 2021-11-06 ENCOUNTER — Encounter: Payer: Self-pay | Admitting: Orthopaedic Surgery

## 2021-11-06 DIAGNOSIS — Z9889 Other specified postprocedural states: Secondary | ICD-10-CM

## 2021-11-06 MED ORDER — OXYCODONE-ACETAMINOPHEN 5-325 MG PO TABS: 1.0000 | ORAL_TABLET | Freq: Three times a day (TID) | ORAL | 0 refills | Status: DC | PRN

## 2021-11-06 NOTE — Progress Notes (Signed)
Post-Op Visit Note   Patient: Bridget [name]           Date of Birth: 1956-10-12           MRN: [id number] Visit Date: 11/06/2021 PCP: Janifer Adie, MD   Assessment & Plan:  Chief Complaint:  Chief Complaint  Patient presents with   Right Shoulder - Routine Post Op   Visit Diagnoses:  1. Status post arthroscopy of right shoulder   2. S/P right rotator cuff repair     Plan: Patient is a pleasant 65 year old female comes in today 1 week status post right shoulder arthroscopic debridement, decompression rotator cuff repair 10/30/2021.  She has been doing well.  She has been compliant wearing her sling.  She has been taking oxycodone as needed for pain.  Semination of her right shoulder reveals well-healed surgical portals with nylon sutures in place.  No evidence of infection or cellulitis.  Fingers are warm well perfused.  She is neurovascular tact distally.  Today, sutures were removed and Steri-Strips applied.  She will continue wearing the sling for another 5 weeks.  We will go ahead and put in an internal referral for physical therapy.  She will follow up with [place] in 5 weeks time for recheck.  Call with concerns or questions in meantime.  Follow-Up Instructions: Return in about 5 weeks (around 12/11/2021).   Orders:  No orders of the defined types were placed in this encounter.  No orders of the defined types were placed in this encounter.   Imaging: No new imaging  PMFS History: Patient Active Problem List   Diagnosis Date Noted   Complete tear of right rotator cuff 10/30/2021   GAD (generalized anxiety disorder) 01/24/2020   Schizoaffective disorder, bipolar type ([place]) 02/04/2018   Past Medical History:  Diagnosis Date   Anxiety    Back pain    low back - arthritis   Barrett's esophagus    Chronic headaches    Constipation    miralax daily   Depression    Dry mouth    biotene   Fibromyalgia    GERD (gastroesophageal reflux disease)    Heart murmur     Hyperlipemia    Insomnia    Myoclonus    patient states "no longer an issue"   Osteoarthritis    lower back, shoulders, hands   Schizoaffective disorder (HCC)    SVD (spontaneous vaginal delivery)    x 3   Vertigo    Wears dentures    full    Family History  Problem Relation Age of Onset   Diabetes Mother    Prostate cancer Father    Colon cancer Neg Hx    Colon polyps Neg Hx    Rectal cancer Neg Hx    Stomach cancer Neg Hx     Past Surgical History:  Procedure Laterality Date   ABDOMINAL HYSTERECTOMY     BREAST EXCISIONAL BIOPSY Right    BREAST EXCISIONAL BIOPSY Right    BREAST EXCISIONAL BIOPSY Right    BREAST EXCISIONAL BIOPSY Right    BUNIONECTOMY Bilateral    COLONOSCOPY  08/2013   HAND SURGERY Right    carpel tunnel surgery   INJECTION KNEE Bilateral 10/30/2021   Procedure: BILATERAL KNEE INJECTIONS;  Surgeon: Leandrew Koyanagi, MD;  Location: [facility];  Service: Orthopedics;  Laterality: Bilateral;   MULTIPLE TOOTH EXTRACTIONS     full dentures   SHOULDER ARTHROSCOPY WITH ROTATOR CUFF REPAIR AND SUBACROMIAL  DECOMPRESSION Right 10/30/2021   Procedure: RIGHT SHOULDER ARTHROSCOPY WITH ROTATOR CUFF REPAIR AND SUBACROMIAL DECOMPRESSION, DEBRIDEMENT;  Surgeon: Leandrew Koyanagi, MD;  Location: [facility];  Service: Orthopedics;  Laterality: Right;   TRIGGER FINGER RELEASE Bilateral    thumbs   Social History   Occupational History   Not on file  Tobacco Use   Smoking status: Never   Smokeless tobacco: Never  Vaping Use   Vaping Use: Never used  Substance and Sexual Activity   Alcohol use: No   Drug use: No   Sexual activity: Not Currently    Birth control/protection: Post-menopausal    Comment: Hysterectomy

## 2021-11-11 ENCOUNTER — Telehealth: Payer: Self-pay | Admitting: Orthopaedic Surgery

## 2021-11-11 NOTE — Telephone Encounter (Signed)
Kia with [facility] called asking for OT orders and to let her know the protocol Dr.Xu wants them to operate under. Kia would like a CB.   Kia CB# [phone]   Email: [email or web address]

## 2021-11-11 NOTE — Telephone Encounter (Signed)
Please send them the SOS protocol please.  Medium protocol.  Thanks.

## 2021-11-13 NOTE — Telephone Encounter (Signed)
Emailed

## 2021-11-13 NOTE — Telephone Encounter (Signed)
Kia called back stating that she has not received anything on this pt and she really needs it today.

## 2021-11-18 ENCOUNTER — Other Ambulatory Visit: Payer: Self-pay | Admitting: Family Medicine

## 2021-11-18 DIAGNOSIS — Z1231 Encounter for screening mammogram for malignant neoplasm of breast: Secondary | ICD-10-CM

## 2021-11-28 ENCOUNTER — Telehealth: Payer: Self-pay | Admitting: Orthopaedic Surgery

## 2021-11-28 NOTE — Telephone Encounter (Signed)
Patient called asked how long is she suppose to wear the brace on her shoulder and hand? The number to contact patient is [phone]

## 2021-11-29 NOTE — Telephone Encounter (Signed)
I called patient and advised, per Lindsay's last note, continue sling x 5 weeks. This will be until her follow up with Dr. Erlinda Hong in the office. She expressed understanding.

## 2021-12-11 ENCOUNTER — Other Ambulatory Visit: Payer: Self-pay

## 2021-12-11 ENCOUNTER — Encounter: Payer: Self-pay | Admitting: Orthopaedic Surgery

## 2021-12-11 ENCOUNTER — Ambulatory Visit (INDEPENDENT_AMBULATORY_CARE_PROVIDER_SITE_OTHER): Payer: Medicare (Managed Care) | Admitting: Orthopaedic Surgery

## 2021-12-11 DIAGNOSIS — Z9889 Other specified postprocedural states: Secondary | ICD-10-CM

## 2021-12-11 NOTE — Progress Notes (Signed)
Post-Op Visit Note   Patient: Bridget [name]           Date of Birth: 01/06/1956           MRN: [id number] Visit Date: 12/11/2021 PCP: Janifer Adie, MD   Assessment & Plan:  Chief Complaint:  Chief Complaint  Patient presents with   Right Shoulder - Post-op Follow-up   Visit Diagnoses:  1. Status post arthroscopy of right shoulder   2. S/P right rotator cuff repair     Plan: Bridget [name] is 6 weeks status post rotator cuff repair.  She is [facility].  No complaints.  Right shoulder surgical scars are healed.  Active forward flexion to 85 and abduction to 50 degrees without significant pain.  External rotation to about 10 degrees.  Bridget [name] will continue with physical therapy.  She may begin strengthening and can also begin weaning the sling as tolerated.  We will recheck in 6 weeks.  Follow-Up Instructions: Return in about 6 weeks (around 01/22/2022).   Orders:  No orders of the defined types were placed in this encounter.  No orders of the defined types were placed in this encounter.   Imaging: No results found.  PMFS History: Patient Active Problem List   Diagnosis Date Noted   Complete tear of right rotator cuff 10/30/2021   GAD (generalized anxiety disorder) 01/24/2020   Schizoaffective disorder, bipolar type ([place]) 02/04/2018   Past Medical History:  Diagnosis Date   Anxiety    Back pain    low back - arthritis   Barrett's esophagus    Chronic headaches    Constipation    miralax daily   Depression    Dry mouth    biotene   Fibromyalgia    GERD (gastroesophageal reflux disease)    Heart murmur    Hyperlipemia    Insomnia    Myoclonus    patient states "no longer an issue"   Osteoarthritis    lower back, shoulders, hands   Schizoaffective disorder (HCC)    SVD (spontaneous vaginal delivery)    x 3   Vertigo    Wears dentures    full    Family History  Problem Relation Age of Onset   Diabetes Mother     Prostate cancer Father    Colon cancer Neg Hx    Colon polyps Neg Hx    Rectal cancer Neg Hx    Stomach cancer Neg Hx     Past Surgical History:  Procedure Laterality Date   ABDOMINAL HYSTERECTOMY     BREAST EXCISIONAL BIOPSY Right    BREAST EXCISIONAL BIOPSY Right    BREAST EXCISIONAL BIOPSY Right    BREAST EXCISIONAL BIOPSY Right    BUNIONECTOMY Bilateral    COLONOSCOPY  08/2013   HAND SURGERY Right    carpel tunnel surgery   INJECTION KNEE Bilateral 10/30/2021   Procedure: BILATERAL KNEE INJECTIONS;  Surgeon: Leandrew Koyanagi, MD;  Location: [facility];  Service: Orthopedics;  Laterality: Bilateral;   MULTIPLE TOOTH EXTRACTIONS     full dentures   SHOULDER ARTHROSCOPY WITH ROTATOR CUFF REPAIR AND SUBACROMIAL DECOMPRESSION Right 10/30/2021   Procedure: RIGHT SHOULDER ARTHROSCOPY WITH ROTATOR CUFF REPAIR AND SUBACROMIAL DECOMPRESSION, DEBRIDEMENT;  Surgeon: Leandrew Koyanagi, MD;  Location: [facility];  Service: Orthopedics;  Laterality: Right;   TRIGGER FINGER RELEASE Bilateral    thumbs   Social History   Occupational History   Not  on file  Tobacco Use   Smoking status: Never   Smokeless tobacco: Never  Vaping Use   Vaping Use: Never used  Substance and Sexual Activity   Alcohol use: No   Drug use: No   Sexual activity: Not Currently    Birth control/protection: Post-menopausal    Comment: Hysterectomy

## 2022-01-23 ENCOUNTER — Encounter: Payer: Self-pay | Admitting: Orthopaedic Surgery

## 2022-01-23 ENCOUNTER — Other Ambulatory Visit: Payer: Self-pay

## 2022-01-23 ENCOUNTER — Ambulatory Visit (INDEPENDENT_AMBULATORY_CARE_PROVIDER_SITE_OTHER): Payer: Medicare (Managed Care)

## 2022-01-23 ENCOUNTER — Ambulatory Visit (INDEPENDENT_AMBULATORY_CARE_PROVIDER_SITE_OTHER): Payer: Medicare (Managed Care) | Admitting: Orthopaedic Surgery

## 2022-01-23 DIAGNOSIS — Z9889 Other specified postprocedural states: Secondary | ICD-10-CM | POA: Diagnosis not present

## 2022-01-23 DIAGNOSIS — M545 Low back pain, unspecified: Secondary | ICD-10-CM | POA: Diagnosis not present

## 2022-01-23 DIAGNOSIS — Z4789 Encounter for other orthopedic aftercare: Secondary | ICD-10-CM

## 2022-01-23 MED ORDER — PREDNISONE 5 MG (21) PO TBPK: ORAL_TABLET | ORAL | 0 refills | Status: DC

## 2022-01-23 MED ORDER — METHOCARBAMOL 500 MG PO TABS: 500.0000 mg | ORAL_TABLET | Freq: Two times a day (BID) | ORAL | 0 refills | Status: DC | PRN

## 2022-01-23 NOTE — Progress Notes (Signed)
Office Visit Note   Patient: Bridget [name]           Date of Birth: [birth year]           MRN: [id number] Visit Date: 01/23/2022              Requested by: Janifer Adie, MD [address] [place],  [place] [address] PCP: Janifer Adie, MD   Assessment & Plan: Visit Diagnoses:  1. Status post arthroscopy of right shoulder   2. Low back pain, unspecified back pain laterality, unspecified chronicity, unspecified whether sciatica present     Plan: Impression is 17-month status post right shoulder arthroscopic debridement rotator cuff repair and chronic low back pain.  In regards to her shoulder, I think she may have a small component of a frozen shoulder and I discussed referral for intra-articular cortisone injection.  She would like to continue with physical therapy and be reevaluated in a few weeks.  If she has not made progress at that point, we will refer to Dr. Ernestina Patches for injection.  In regards to her back, I would like to start her on a steroid taper and muscle relaxer.  I have also written her prescription for patient to incorporate lumbar stretches and strengthening exercises.  She will follow-up with [place] in 6 weeks time for her shoulder.  Call with concerns or questions.  Follow-Up Instructions: Return in about 6 weeks (around 03/06/2022).   Orders:  Orders Placed This Encounter  Procedures   XR Lumbar Spine 2-3 Views   Meds ordered this encounter  Medications   predniSONE (STERAPRED UNI-PAK 21 TAB) 5 MG (21) TBPK tablet    Sig: Take as directed    Dispense:  21 tablet    Refill:  0   methocarbamol (ROBAXIN) 500 MG tablet    Sig: Take 1 tablet (500 mg total) by mouth 2 (two) times daily as needed.    Dispense:  20 tablet    Refill:  0      Procedures: No procedures performed   Clinical Data: No additional findings.   Subjective: Chief Complaint  Patient presents with   Right Shoulder - Pain    HPI patient is a pleasant 66 year old female who comes  in today approximate 12 weeks status post right shoulder arthroscopic debridement rotator cuff repair.  She has been doing well.  Minimal pain to the right shoulder.  She has been [facility] at [facility] and has been making progress.  Other issue she brings up today is back pain and muscle spasms which have been ongoing for the past 3 months.  No known injury or change in activity.  The pain is primarily the right lower back and radiates down the right lower extremity.  No weakness.  There are no specific aggravators as she feels as though her pain is nearly constant.  She denies any paresthesias to the right lower extremity.  No new bowel or bladder change.  No saddle paresthesias.  Review of Systems as detailed in HPI.  All others reviewed and are negative.   Objective: Vital Signs: There were no vitals taken for this visit.  Physical Exam well-developed well-nourished female no acute distress.  Alert and oriented x3.  Ortho Exam right shoulder exam reveals forward flexion to approximately 160 degrees.  She can abduct to approximately 120 degrees.  Internal rotation to L5.  She has approximately 45 degrees of external rotation.  Near full strength.  She is neurovascular tact  distally.  Lumbar spine exam shows mild right-sided paraspinous tenderness.  Increased pain with lumbar flexion extension.  Positive straight leg raise on the right.  No focal deficits.  She is neurovascular tact distally.  Specialty Comments:  No specialty comments available.  Imaging: XR Lumbar Spine 2-3 Views  Result Date: 01/23/2022 Moderate multilevel degenerative changes    PMFS History: Patient Active Problem List   Diagnosis Date Noted   Complete tear of right rotator cuff 10/30/2021   GAD (generalized anxiety disorder) 01/24/2020   Schizoaffective disorder, bipolar type ([place]) 02/04/2018   Past Medical History:  Diagnosis Date   Anxiety    Back pain    low back - arthritis   Barrett's esophagus     Chronic headaches    Constipation    miralax daily   Depression    Dry mouth    biotene   Fibromyalgia    GERD (gastroesophageal reflux disease)    Heart murmur    Hyperlipemia    Insomnia    Myoclonus    patient states "no longer an issue"   Osteoarthritis    lower back, shoulders, hands   Schizoaffective disorder (HCC)    SVD (spontaneous vaginal delivery)    x 3   Vertigo    Wears dentures    full    Family History  Problem Relation Age of Onset   Diabetes Mother    Prostate cancer Father    Colon cancer Neg Hx    Colon polyps Neg Hx    Rectal cancer Neg Hx    Stomach cancer Neg Hx     Past Surgical History:  Procedure Laterality Date   ABDOMINAL HYSTERECTOMY     BREAST EXCISIONAL BIOPSY Right    BREAST EXCISIONAL BIOPSY Right    BREAST EXCISIONAL BIOPSY Right    BREAST EXCISIONAL BIOPSY Right    BUNIONECTOMY Bilateral    COLONOSCOPY  08/2013   HAND SURGERY Right    carpel tunnel surgery   INJECTION KNEE Bilateral 10/30/2021   Procedure: BILATERAL KNEE INJECTIONS;  Surgeon: Leandrew Koyanagi, MD;  Location: [facility];  Service: Orthopedics;  Laterality: Bilateral;   MULTIPLE TOOTH EXTRACTIONS     full dentures   SHOULDER ARTHROSCOPY WITH ROTATOR CUFF REPAIR AND SUBACROMIAL DECOMPRESSION Right 10/30/2021   Procedure: RIGHT SHOULDER ARTHROSCOPY WITH ROTATOR CUFF REPAIR AND SUBACROMIAL DECOMPRESSION, DEBRIDEMENT;  Surgeon: Leandrew Koyanagi, MD;  Location: [facility];  Service: Orthopedics;  Laterality: Right;   TRIGGER FINGER RELEASE Bilateral    thumbs   Social History   Occupational History   Not on file  Tobacco Use   Smoking status: Never   Smokeless tobacco: Never  Vaping Use   Vaping Use: Never used  Substance and Sexual Activity   Alcohol use: No   Drug use: No   Sexual activity: Not Currently    Birth control/protection: Post-menopausal    Comment: Hysterectomy

## 2022-01-28 ENCOUNTER — Telehealth: Payer: Self-pay

## 2022-01-28 ENCOUNTER — Other Ambulatory Visit: Payer: Self-pay

## 2022-01-28 ENCOUNTER — Encounter: Payer: Self-pay | Admitting: Orthopaedic Surgery

## 2022-01-28 ENCOUNTER — Ambulatory Visit (INDEPENDENT_AMBULATORY_CARE_PROVIDER_SITE_OTHER): Payer: Medicare (Managed Care) | Admitting: Orthopaedic Surgery

## 2022-01-28 VITALS — Ht 64.0 in | Wt 230.0 lb

## 2022-01-28 DIAGNOSIS — M1711 Unilateral primary osteoarthritis, right knee: Secondary | ICD-10-CM

## 2022-01-28 DIAGNOSIS — M1712 Unilateral primary osteoarthritis, left knee: Secondary | ICD-10-CM

## 2022-01-28 HISTORY — DX: Unilateral primary osteoarthritis, right knee: M17.11

## 2022-01-28 HISTORY — DX: Unilateral primary osteoarthritis, left knee: M17.12

## 2022-01-28 NOTE — Telephone Encounter (Signed)
Noted  

## 2022-01-28 NOTE — Telephone Encounter (Signed)
Please submit for bil knee gel injection.

## 2022-01-28 NOTE — Progress Notes (Signed)
Office Visit Note   Patient: Bridget [name]           Date of Birth: June 13, 1956           MRN: [id number] Visit Date: 01/28/2022              Requested by: Janifer Adie, MD [address] [place],  [place] [address] PCP: Janifer Adie, MD   Assessment & Plan: Visit Diagnoses:  1. Primary osteoarthritis of right knee   2. Primary osteoarthritis of left knee     Plan: Jeylin returns today for follow-up of bilateral knee pain from severe DJD of greater than 5 years.  She has pain throughout her knees with lots of crepitus and weakness.  Previous cortisone injections have provided temporary relief.  She is interested in having a knee replacement at some point.  Examination of both knees show 2+ patellofemoral crepitus with range of motion.  There is significant pain with range of motion.  Collaterals and cruciates are stable.  Trace effusions.  Impression is end-stage bilateral knee DJD.  At this point she has failed to receive pain relief from conservative management such as physical therapy, weight loss, cortisone injections and she has elected to move forward with total knee replacement in the near future.  She is only 3 months status post rotator cuff repair so I think it might be a good idea to wait until she gets to 6 months status post rotator cuff repair before we try doing a knee replacement so that she can have adequate function of her arms to help with rehab for the knee replacement.  Follow-Up Instructions: No follow-ups on file.   Orders:  No orders of the defined types were placed in this encounter.  No orders of the defined types were placed in this encounter.     Procedures: No procedures performed   Clinical Data: No additional findings.   Subjective: Chief Complaint  Patient presents with   Right Knee - Pain   Left Knee - Pain    HPI  Review of Systems  Constitutional: Negative.   HENT: Negative.    Eyes: Negative.   Respiratory: Negative.     Cardiovascular: Negative.   Endocrine: Negative.   Musculoskeletal: Negative.   Neurological: Negative.   Hematological: Negative.   Psychiatric/Behavioral: Negative.    All other systems reviewed and are negative.   Objective: Vital Signs: Ht 5\' 4"  (1.626 m)    Wt 230 lb (104.3 kg)    BMI 39.48 kg/m   Physical Exam Vitals and nursing note reviewed.  Constitutional:      Appearance: She is well-developed.  Pulmonary:     Effort: Pulmonary effort is normal.  Skin:    General: Skin is warm.     Capillary Refill: Capillary refill takes less than 2 seconds.  Neurological:     Mental Status: She is alert and oriented to person, place, and time.  Psychiatric:        Behavior: Behavior normal.        Thought Content: Thought content normal.        Judgment: Judgment normal.    Ortho Exam  Specialty Comments:  No specialty comments available.  Imaging: No results found.   PMFS History: Patient Active Problem List   Diagnosis Date Noted   Primary osteoarthritis of right knee 01/28/2022   Primary osteoarthritis of left knee 01/28/2022   Complete tear of right rotator cuff 10/30/2021   GAD (generalized  anxiety disorder) 01/24/2020   Schizoaffective disorder, bipolar type ([place]) 02/04/2018   Past Medical History:  Diagnosis Date   Anxiety    Back pain    low back - arthritis   Barrett's esophagus    Chronic headaches    Constipation    miralax daily   Depression    Dry mouth    biotene   Fibromyalgia    GERD (gastroesophageal reflux disease)    Heart murmur    Hyperlipemia    Insomnia    Myoclonus    patient states "no longer an issue"   Osteoarthritis    lower back, shoulders, hands   Schizoaffective disorder (HCC)    SVD (spontaneous vaginal delivery)    x 3   Vertigo    Wears dentures    full    Family History  Problem Relation Age of Onset   Diabetes Mother    Prostate cancer Father    Colon cancer Neg Hx    Colon polyps Neg Hx    Rectal  cancer Neg Hx    Stomach cancer Neg Hx     Past Surgical History:  Procedure Laterality Date   ABDOMINAL HYSTERECTOMY     BREAST EXCISIONAL BIOPSY Right    BREAST EXCISIONAL BIOPSY Right    BREAST EXCISIONAL BIOPSY Right    BREAST EXCISIONAL BIOPSY Right    BUNIONECTOMY Bilateral    COLONOSCOPY  08/2013   HAND SURGERY Right    carpel tunnel surgery   INJECTION KNEE Bilateral 10/30/2021   Procedure: BILATERAL KNEE INJECTIONS;  Surgeon: Leandrew Koyanagi, MD;  Location: [facility];  Service: Orthopedics;  Laterality: Bilateral;   MULTIPLE TOOTH EXTRACTIONS     full dentures   SHOULDER ARTHROSCOPY WITH ROTATOR CUFF REPAIR AND SUBACROMIAL DECOMPRESSION Right 10/30/2021   Procedure: RIGHT SHOULDER ARTHROSCOPY WITH ROTATOR CUFF REPAIR AND SUBACROMIAL DECOMPRESSION, DEBRIDEMENT;  Surgeon: Leandrew Koyanagi, MD;  Location: [facility];  Service: Orthopedics;  Laterality: Right;   TRIGGER FINGER RELEASE Bilateral    thumbs   Social History   Occupational History   Not on file  Tobacco Use   Smoking status: Never   Smokeless tobacco: Never  Vaping Use   Vaping Use: Never used  Substance and Sexual Activity   Alcohol use: No   Drug use: No   Sexual activity: Not Currently    Birth control/protection: Post-menopausal    Comment: Hysterectomy

## 2022-02-07 ENCOUNTER — Telehealth: Payer: Self-pay

## 2022-02-07 NOTE — Telephone Encounter (Signed)
VOB submitted for Monovisc, bilateral knee. BV Pending

## 2022-02-14 ENCOUNTER — Telehealth: Payer: Self-pay | Admitting: Orthopaedic Surgery

## 2022-02-14 NOTE — Telephone Encounter (Signed)
Received call from [facility] stating the injection was approved and would like to go ahead and schedule the appointment. The number to contact Nira Conn is [phone] ?

## 2022-02-17 ENCOUNTER — Telehealth: Payer: Self-pay

## 2022-02-17 NOTE — Telephone Encounter (Signed)
Approved for Monovisc, bilateral knee. ?[place] ?Responsible for 20% OOP. ?No Co-pay ? No PA required ? ?

## 2022-02-17 NOTE — Telephone Encounter (Signed)
Talked with Nira Conn with [facility] and advised her that patient has an appointment scheduled for 03/04/2022. ?

## 2022-02-26 ENCOUNTER — Ambulatory Visit
Admission: RE | Admit: 2022-02-26 | Discharge: 2022-02-26 | Disposition: A | Discharge status: Home / Self Care | Payer: Medicare (Managed Care) | Source: Ambulatory Visit | Attending: Family Medicine | Admitting: Family Medicine

## 2022-02-26 DIAGNOSIS — Z1231 Encounter for screening mammogram for malignant neoplasm of breast: Secondary | ICD-10-CM

## 2022-03-04 ENCOUNTER — Other Ambulatory Visit: Payer: Self-pay

## 2022-03-04 ENCOUNTER — Ambulatory Visit (INDEPENDENT_AMBULATORY_CARE_PROVIDER_SITE_OTHER): Payer: Medicare (Managed Care) | Admitting: Orthopaedic Surgery

## 2022-03-04 ENCOUNTER — Encounter: Payer: Self-pay | Admitting: Orthopaedic Surgery

## 2022-03-04 DIAGNOSIS — M170 Bilateral primary osteoarthritis of knee: Secondary | ICD-10-CM

## 2022-03-04 MED ORDER — LIDOCAINE HCL 1 % IJ SOLN
3.0000 mL | INTRAMUSCULAR | Status: AC | PRN
  Administered 2022-03-04: 3 mL

## 2022-03-04 MED ORDER — BUPIVACAINE HCL 0.25 % IJ SOLN
0.6600 mL | INTRAMUSCULAR | Status: AC | PRN
  Administered 2022-03-04: .66 mL via INTRA_ARTICULAR

## 2022-03-04 MED ORDER — HYALURONAN 88 MG/4ML IX SOSY
88.0000 mg | PREFILLED_SYRINGE | INTRA_ARTICULAR | Status: AC | PRN
  Administered 2022-03-04: 88 mg via INTRA_ARTICULAR

## 2022-03-04 NOTE — Progress Notes (Signed)
? ?Office Visit Note ?  ?Patient: Bridget [name]           ?Date of Birth: [birth year]           ?MRN: [id number] ?Visit Date: 03/04/2022 ?             ?Requested by: Janifer Adie, MD ?[facility] ?[place],  [place] [address] ?PCP: Janifer Adie, MD ? ? ?Assessment & Plan: ?Visit Diagnoses:  ?1. Bilateral primary osteoarthritis of knee   ? ? ?Plan: Impression is bilateral knee osteoarthritis.  Today, proceed with bilateral knee Monovisc injections.  She tolerated these well.  Follow-up with [place] as needed. ? ?Follow-Up Instructions: Return if symptoms worsen or fail to improve.  ? ?Orders:  ?Orders Placed This Encounter  ?Procedures  ? Large Joint Inj  ? ?No orders of the defined types were placed in this encounter. ? ? ? ? Procedures: ?Large Joint Inj: bilateral knee on 03/04/2022 11:49 AM ?Indications: pain ?Details: 22 G needle, anterolateral approach ?Medications (Right): 0.66 mL bupivacaine 0.25 %; 3 mL lidocaine 1 %; 88 mg Hyaluronan 88 MG/4ML ?Medications (Left): 0.66 mL bupivacaine 0.25 %; 3 mL lidocaine 1 %; 88 mg Hyaluronan 88 MG/4ML ? ? ? ? ?Clinical Data: ?No additional findings. ? ? ?Subjective: ?Chief Complaint  ?Patient presents with  ? Right Knee - Follow-up  ?  monovisc  ? Left Knee - Follow-up  ?  monovisc  ? ? ?HPI patient is a pleasant 66 year old female who comes in today for bilateral knee Monovisc injections.  She has not previously undergone viscosupplementation injections.  She has had cortisone injections previously with good but temporary relief. ? ? ? ? ?Objective: ?Vital Signs: There were no vitals taken for this visit. ? ? ? ?Ortho Exam stable bilateral knee exam ? ?Specialty Comments:  ?No specialty comments available. ? ?Imaging: ?No new imaging ? ? ?PMFS History: ?Patient Active Problem List  ? Diagnosis Date Noted  ? Primary osteoarthritis of right knee 01/28/2022  ? Primary osteoarthritis of left knee 01/28/2022  ? Complete tear of right rotator cuff 10/30/2021  ? GAD  (generalized anxiety disorder) 01/24/2020  ? Schizoaffective disorder, bipolar type ([place]) 02/04/2018  ? ?Past Medical History:  ?Diagnosis Date  ? Anxiety   ? Back pain   ? low back - arthritis  ? Barrett's esophagus   ? Chronic headaches   ? Constipation   ? miralax daily  ? Depression   ? Dry mouth   ? biotene  ? Fibromyalgia   ? GERD (gastroesophageal reflux disease)   ? Heart murmur   ? Hyperlipemia   ? Insomnia   ? Myoclonus   ? patient states "no longer an issue"  ? Osteoarthritis   ? lower back, shoulders, hands  ? Schizoaffective disorder ([place])   ? SVD (spontaneous vaginal delivery)   ? x 3  ? Vertigo   ? Wears dentures   ? full  ?  ?Family History  ?Problem Relation Age of Onset  ? Diabetes Mother   ? Prostate cancer Father   ? Colon cancer Neg Hx   ? Colon polyps Neg Hx   ? Rectal cancer Neg Hx   ? Stomach cancer Neg Hx   ?  ?Past Surgical History:  ?Procedure Laterality Date  ? ABDOMINAL HYSTERECTOMY    ? BREAST EXCISIONAL BIOPSY Right   ? BREAST EXCISIONAL BIOPSY Right   ? BREAST EXCISIONAL BIOPSY Right   ? BREAST EXCISIONAL BIOPSY Right   ?  BUNIONECTOMY Bilateral   ? COLONOSCOPY  08/2013  ? HAND SURGERY Right   ? carpel tunnel surgery  ? INJECTION KNEE Bilateral 10/30/2021  ? Procedure: BILATERAL KNEE INJECTIONS;  Surgeon: Leandrew Koyanagi, MD;  Location: [facility];  Service: Orthopedics;  Laterality: Bilateral;  ? MULTIPLE TOOTH EXTRACTIONS    ? full dentures  ? SHOULDER ARTHROSCOPY WITH ROTATOR CUFF REPAIR AND SUBACROMIAL DECOMPRESSION Right 10/30/2021  ? Procedure: RIGHT SHOULDER ARTHROSCOPY WITH ROTATOR CUFF REPAIR AND SUBACROMIAL DECOMPRESSION, DEBRIDEMENT;  Surgeon: Leandrew Koyanagi, MD;  Location: [facility];  Service: Orthopedics;  Laterality: Right;  ? TRIGGER FINGER RELEASE Bilateral   ? thumbs  ? ?Social History  ? ?Occupational History  ? Not on file  ?Tobacco Use  ? Smoking status: Never  ? Smokeless tobacco: Never  ?Vaping Use  ? Vaping Use: Never used  ?Substance  and Sexual Activity  ? Alcohol use: No  ? Drug use: No  ? Sexual activity: Not Currently  ?  Birth control/protection: Post-menopausal  ?  Comment: Hysterectomy  ? ? ? ? ? ? ?

## 2022-03-11 ENCOUNTER — Telehealth: Payer: Self-pay | Admitting: Orthopaedic Surgery

## 2022-03-11 ENCOUNTER — Other Ambulatory Visit: Payer: Self-pay | Admitting: Physician Assistant

## 2022-03-11 MED ORDER — TRAMADOL HCL 50 MG PO TABS: 50.0000 mg | ORAL_TABLET | Freq: Every day | ORAL | 0 refills | Status: DC | PRN

## 2022-03-11 NOTE — Telephone Encounter (Signed)
Pt called and states she miss placed her tramadol and asking for another script. Spoke with Kathlee Nations. Kathlee Nations will speak with Dr. Erlinda Hong for options. Please call pt at [phone] ?

## 2022-03-11 NOTE — Telephone Encounter (Signed)
Sent in

## 2022-03-12 ENCOUNTER — Telehealth: Payer: Self-pay | Admitting: Orthopaedic Surgery

## 2022-03-12 ENCOUNTER — Other Ambulatory Visit: Payer: Self-pay | Admitting: Physician Assistant

## 2022-03-12 NOTE — Telephone Encounter (Signed)
[facility] ?

## 2022-03-12 NOTE — Telephone Encounter (Signed)
Called patient. Would like it sent to  ?[facility]  ?

## 2022-03-12 NOTE — Telephone Encounter (Signed)
[facility]  ?

## 2022-03-12 NOTE — Telephone Encounter (Signed)
Patient aware.

## 2022-03-12 NOTE — Telephone Encounter (Signed)
Duplicate msg.

## 2022-03-12 NOTE — Telephone Encounter (Signed)
[facility] [address]

## 2022-03-12 NOTE — Telephone Encounter (Signed)
Pt called and states that she wanted [facility] [address] in [place]  ?

## 2022-03-13 ENCOUNTER — Other Ambulatory Visit: Payer: Self-pay | Admitting: Physician Assistant

## 2022-03-13 ENCOUNTER — Telehealth: Payer: Self-pay | Admitting: Orthopaedic Surgery

## 2022-03-13 MED ORDER — TRAMADOL HCL 50 MG PO TABS: 50.0000 mg | ORAL_TABLET | Freq: Every day | ORAL | 0 refills | Status: DC | PRN

## 2022-03-13 NOTE — Telephone Encounter (Signed)
sent 

## 2022-03-13 NOTE — Telephone Encounter (Signed)
Pt called and states the pharm said they do not have script for tramadol but it was sent in epic?  ? ?CB [phone]  ?

## 2022-03-13 NOTE — Telephone Encounter (Signed)
Patient aware Rx sent this AM. She will go pick up. ? ? ?

## 2022-03-13 NOTE — Telephone Encounter (Signed)
Patient aware.

## 2022-03-31 ENCOUNTER — Other Ambulatory Visit: Payer: Self-pay | Admitting: Family Medicine

## 2022-04-09 ENCOUNTER — Other Ambulatory Visit: Payer: Self-pay | Admitting: Family Medicine

## 2022-04-09 DIAGNOSIS — M5431 Sciatica, right side: Secondary | ICD-10-CM

## 2022-04-28 ENCOUNTER — Ambulatory Visit
Admission: RE | Admit: 2022-04-28 | Discharge: 2022-04-28 | Disposition: A | Discharge status: Home / Self Care | Payer: Medicare (Managed Care) | Source: Ambulatory Visit | Attending: Family Medicine | Admitting: Family Medicine

## 2022-04-28 DIAGNOSIS — M5431 Sciatica, right side: Secondary | ICD-10-CM

## 2022-04-28 MED ORDER — METHYLPREDNISOLONE ACETATE 40 MG/ML INJ SUSP (RADIOLOG
80.0000 mg | Freq: Once | INTRAMUSCULAR | Status: AC
  Administered 2022-04-28: 80 mg via EPIDURAL

## 2022-04-28 MED ORDER — IOPAMIDOL (ISOVUE-M 200) INJECTION 41%
1.0000 mL | Freq: Once | INTRAMUSCULAR | Status: AC
  Administered 2022-04-28: 1 mL via EPIDURAL

## 2022-04-28 NOTE — Discharge Instructions (Signed)

## 2022-05-27 ENCOUNTER — Ambulatory Visit (INDEPENDENT_AMBULATORY_CARE_PROVIDER_SITE_OTHER): Payer: Medicare (Managed Care) | Admitting: Pulmonary Disease

## 2022-05-27 ENCOUNTER — Encounter: Payer: Self-pay | Admitting: Pulmonary Disease

## 2022-05-27 VITALS — BP 128/82 | HR 89 | Temp 98.1°F | Ht 64.0 in | Wt 226.0 lb

## 2022-05-27 DIAGNOSIS — F5104 Psychophysiologic insomnia: Secondary | ICD-10-CM | POA: Diagnosis not present

## 2022-05-27 DIAGNOSIS — F250 Schizoaffective disorder, bipolar type: Secondary | ICD-10-CM | POA: Diagnosis not present

## 2022-05-27 MED ORDER — DARIDOREXANT HCL 50 MG PO TABS: 50.0000 mg | ORAL_TABLET | Freq: Every evening | ORAL | 5 refills | Status: DC

## 2022-05-27 NOTE — Patient Instructions (Signed)
I will see you back in about 3 months  I will increase your dose of sleep medication to try and get you better sleep at night  You are on a lot of other medications that may contribute to sleepiness and affect of sleep at night, risk of side effects have to be considered when with making decisions regarding your sleep aids

## 2022-05-27 NOTE — Progress Notes (Signed)
Bridget [name]    [id number]    [birth year]  Primary Care Physician:Koehler, Angelyn Punt, MD  Referring Physician: Janifer Adie, [place],  [place] [address]  Chief complaint:   Patient being seen today for chronic insomnia.    HPI: No changes in health since last time she was here  She continues to use [facility] nightly, still feels not able to sleep well  Tries to go to bed at about 9 PM, might take about 2 to 3 hours to fall asleep Wakes up frequently She gets out of bed finally about 10 AM but she says she just stays in bed not because she is sleeping through the night Feels tired during the day   She did bring again a 2-week sleep diary that reflects she is only been sleeping about 2 to 3 hours of noncontinuous sleep  Occasionally does get about 4 hours of noncontinuous sleep  She does have multiple joint pains, neck pain back pain, knee pain States she does not sleep at all  Brother with sleep issues as well  Not sleepy during the day No dryness of the mouth in the mornings  History of snoring, no history of concern for sleep apnea  Has tried multiple medications in the past that has not helped with sleep and Did use trazodone in the past, get  She does have a history of schizoaffective disorder-bipolar type, generalized anxiety disorder  Outpatient Encounter Medications as of 05/27/2022  Medication Sig   ARIPiprazole (ABILIFY) 10 MG tablet Take 10 mg by mouth daily.   celecoxib (CELEBREX) 200 MG capsule Take 200 mg by mouth daily.   Daridorexant HCl 50 MG TABS Take 50 mg by mouth at bedtime.   DULoxetine (CYMBALTA) 60 MG capsule Take 60 mg by mouth 2 (two) times daily.   esomeprazole (NEXIUM) 40 MG capsule Take 40 mg by mouth daily at 12 noon.   hydrOXYzine (VISTARIL) 25 MG capsule Take 1 capsule (25 mg total) by mouth 3 (three) times daily as needed for anxiety.   LORazepam (ATIVAN) 0.5 MG tablet Take 0.5 mg by mouth 2 (two) times  daily.   methocarbamol (ROBAXIN) 500 MG tablet Take 1 tablet (500 mg total) by mouth 2 (two) times daily as needed.   polyethylene glycol (MIRALAX / GLYCOLAX) packet Take 17 g by mouth daily as needed.   risperiDONE (RISPERDAL) 1 MG tablet Take 1-2 mg by mouth See admin instructions. 1 MG at night for agitation and 2 MG at bedtime for sleep   rosuvastatin (CRESTOR) 20 MG tablet Take 20 mg by mouth daily.   sodium chloride (MURO 128) 5 % ophthalmic ointment Place 1 application into both eyes 4 (four) times daily as needed for eye irritation.   traMADol (ULTRAM) 50 MG tablet Take 1-2 tablets (50-100 mg total) by mouth daily as needed.   [DISCONTINUED] Daridorexant HCl (QUVIVIQ) 25 MG TABS Take 25 mg by mouth at bedtime.   [DISCONTINUED] oxyCODONE-acetaminophen (PERCOCET) 5-325 MG tablet Take 1 tablet by mouth every 8 (eight) hours as needed.   [DISCONTINUED] predniSONE (STERAPRED UNI-PAK 21 TAB) 5 MG (21) TBPK tablet Take as directed (Patient not taking: Reported on 05/27/2022)   No facility-administered encounter medications on file as of 05/27/2022.    Allergies as of 05/27/2022 - Review Complete 05/27/2022  Allergen Reaction Noted   Flexeril [cyclobenzaprine] Other (See Comments) 09/22/2017   Amitriptyline Other (See Comments) 09/22/2017   Penicillins Rash  09/22/2017    Past Medical History:  Diagnosis Date   Anxiety    Back pain    low back - arthritis   Barrett's esophagus    Chronic headaches    Constipation    miralax daily   Depression    Dry mouth    biotene   Fibromyalgia    GERD (gastroesophageal reflux disease)    Heart murmur    Hyperlipemia    Insomnia    Myoclonus    patient states "no longer an issue"   Osteoarthritis    lower back, shoulders, hands   Schizoaffective disorder (HCC)    SVD (spontaneous vaginal delivery)    x 3   Vertigo    Wears dentures    full    Past Surgical History:  Procedure Laterality Date   ABDOMINAL HYSTERECTOMY     BREAST  EXCISIONAL BIOPSY Right    BREAST EXCISIONAL BIOPSY Right    BREAST EXCISIONAL BIOPSY Right    BREAST EXCISIONAL BIOPSY Right    BUNIONECTOMY Bilateral    COLONOSCOPY  08/2013   HAND SURGERY Right    carpel tunnel surgery   INJECTION KNEE Bilateral 10/30/2021   Procedure: BILATERAL KNEE INJECTIONS;  Surgeon: Leandrew Koyanagi, MD;  Location: [facility];  Service: Orthopedics;  Laterality: Bilateral;   MULTIPLE TOOTH EXTRACTIONS     full dentures   SHOULDER ARTHROSCOPY WITH ROTATOR CUFF REPAIR AND SUBACROMIAL DECOMPRESSION Right 10/30/2021   Procedure: RIGHT SHOULDER ARTHROSCOPY WITH ROTATOR CUFF REPAIR AND SUBACROMIAL DECOMPRESSION, DEBRIDEMENT;  Surgeon: Leandrew Koyanagi, MD;  Location: [facility];  Service: Orthopedics;  Laterality: Right;   TRIGGER FINGER RELEASE Bilateral    thumbs    Family History  Problem Relation Age of Onset   Diabetes Mother    Prostate cancer Father    Colon cancer Neg Hx    Colon polyps Neg Hx    Rectal cancer Neg Hx    Stomach cancer Neg Hx     Social History   Socioeconomic History   Marital status: Married    Spouse name: Not on file   Number of children: Not on file   Years of education: Not on file   Highest education level: Not on file  Occupational History   Not on file  Tobacco Use   Smoking status: Never   Smokeless tobacco: Never  Vaping Use   Vaping Use: Never used  Substance and Sexual Activity   Alcohol use: No   Drug use: No   Sexual activity: Not Currently    Birth control/protection: Post-menopausal    Comment: Hysterectomy  Other Topics Concern   Not on file  Social History Narrative   Not on file   Social Determinants of Health   Financial Resource Strain: Not on file  Food Insecurity: Not on file  Transportation Needs: Not on file  Physical Activity: Not on file  Stress: Not on file  Social Connections: Not on file  Intimate Partner Violence: Not on file    Review of Systems   Psychiatric/Behavioral:  Positive for sleep disturbance.   All other systems reviewed and are negative.   Vitals:   05/27/22 1055  BP: 128/82  Pulse: 89  Temp: 98.1 F (36.7 C)  SpO2: 98%     Physical Exam Constitutional:      Appearance: She is obese.  HENT:     Head: Normocephalic and atraumatic.     Mouth/Throat:     Mouth: Mucous  membranes are moist.     Comments: Mallampati 3, crowded oropharynx Eyes:     Extraocular Movements: Extraocular movements intact.     Pupils: Pupils are equal, round, and reactive to light.  Cardiovascular:     Rate and Rhythm: Normal rate and regular rhythm.     Heart sounds: No murmur heard.    No friction rub.  Pulmonary:     Effort: No respiratory distress.     Breath sounds: No stridor. No wheezing or rhonchi.  Musculoskeletal:     Cervical back: No rigidity or tenderness.  Neurological:     Mental Status: She is alert.  Psychiatric:        Mood and Affect: Mood normal.    Epworth Sleepiness Scale of 0   Assessment:  Chronic insomnia -Has tried multiple medications in the past with no LOC  Insomnia related to medical condition -Trial with Lunesta did not help  Sleep log reveals nonconsolidated sleep and hardly sleeping a total  Plan/Recommendations: Will switch to quiviviq -Has been on daridorexant 25 mg -Still continues to have significant difficulty falling asleep and staying asleep  I will increase dose of daridorexant to 50 mg  Follow-up in 3 months  Exercise as tolerated   Sherrilyn Rist MD [facility] 05/27/2022, 11:13 AM  CC: Janifer Adie, MD

## 2022-07-08 ENCOUNTER — Ambulatory Visit: Payer: Medicare (Managed Care) | Admitting: Orthopaedic Surgery

## 2022-09-01 ENCOUNTER — Ambulatory Visit: Payer: Medicare (Managed Care) | Admitting: Pulmonary Disease

## 2022-09-01 ENCOUNTER — Ambulatory Visit (INDEPENDENT_AMBULATORY_CARE_PROVIDER_SITE_OTHER): Payer: Medicare (Managed Care) | Admitting: Pulmonary Disease

## 2022-09-01 ENCOUNTER — Encounter: Payer: Self-pay | Admitting: Pulmonary Disease

## 2022-09-01 VITALS — BP 114/72 | HR 96 | Temp 97.5°F | Ht 64.0 in | Wt 219.6 lb

## 2022-09-01 DIAGNOSIS — F5104 Psychophysiologic insomnia: Secondary | ICD-10-CM

## 2022-09-01 NOTE — Progress Notes (Signed)
Bridget [name]    [id number]    Oct 22, 1956  Primary Care Physician:Koehler, Angelyn Punt, MD  Referring Physician: Janifer Adie, [place],   [id number]  Chief complaint:   Patient being seen today for chronic insomnia.    HPI: No changes in health since last time she was here  Has been doing better with 50 mg of daridorexant  Still wakes up a few times during the night  She does take in a good bit of fluids late into the evening and this may be contributing to waking up a few times during the night  Tries to go to bed at about 9 PM, might take about 2 to 3 hours to fall asleep Wakes up frequently She gets out of bed finally about 10 AM but she says she just stays in bed not because she is sleeping through the night  Sleeps better but still feels tired during the day  Previously noted-she did bring again a 2-week sleep diary that reflects she is only been sleeping about 2 to 3 hours of noncontinuous sleep  Occasionally does get about 4 hours of noncontinuous sleep  She does have multiple joint pains, neck pain back pain, knee pain States she does not sleep at all  Brother with sleep issues as well  Not sleepy during the day No dryness of the mouth in the mornings  History of snoring, no history of concern for sleep apnea  Has tried multiple medications in the past that has not helped with sleep and Did use trazodone in the past, get  She does have a history of schizoaffective disorder-bipolar type, generalized anxiety disorder  Outpatient Encounter Medications as of 09/01/2022  Medication Sig   ARIPiprazole (ABILIFY) 10 MG tablet Take 10 mg by mouth daily.   celecoxib (CELEBREX) 200 MG capsule Take 200 mg by mouth daily.   Daridorexant HCl 50 MG TABS Take 50 mg by mouth at bedtime.   DULoxetine (CYMBALTA) 60 MG capsule Take 60 mg by mouth 2 (two) times daily.   esomeprazole (NEXIUM) 40 MG capsule Take 40 mg by mouth daily at 12  noon.   hydrOXYzine (VISTARIL) 25 MG capsule Take 1 capsule (25 mg total) by mouth 3 (three) times daily as needed for anxiety.   LORazepam (ATIVAN) 0.5 MG tablet Take 0.5 mg by mouth 2 (two) times daily.   methocarbamol (ROBAXIN) 500 MG tablet Take 1 tablet (500 mg total) by mouth 2 (two) times daily as needed.   polyethylene glycol (MIRALAX / GLYCOLAX) packet Take 17 g by mouth daily as needed.   risperiDONE (RISPERDAL) 1 MG tablet Take 1-2 mg by mouth See admin instructions. 1 MG at night for agitation and 2 MG at bedtime for sleep   rosuvastatin (CRESTOR) 20 MG tablet Take 20 mg by mouth daily.   sodium chloride (MURO 128) 5 % ophthalmic ointment Place 1 application into both eyes 4 (four) times daily as needed for eye irritation.   traMADol (ULTRAM) 50 MG tablet Take 1-2 tablets (50-100 mg total) by mouth daily as needed.   No facility-administered encounter medications on file as of 09/01/2022.    Allergies as of 09/01/2022 - Review Complete 09/01/2022  Allergen Reaction Noted   Flexeril [cyclobenzaprine] Other (See Comments) 09/22/2017   Amitriptyline Other (See Comments) 09/22/2017   Penicillins Rash 09/22/2017    Past Medical History:  Diagnosis Date   Anxiety    Back pain  low back - arthritis   Barrett's esophagus    Chronic headaches    Constipation    miralax daily   Depression    Dry mouth    biotene   Fibromyalgia    GERD (gastroesophageal reflux disease)    Heart murmur    Hyperlipemia    Insomnia    Myoclonus    patient states "no longer an issue"   Osteoarthritis    lower back, shoulders, hands   Schizoaffective disorder (HCC)    SVD (spontaneous vaginal delivery)    x 3   Vertigo    Wears dentures    full    Past Surgical History:  Procedure Laterality Date   ABDOMINAL HYSTERECTOMY     BREAST EXCISIONAL BIOPSY Right    BREAST EXCISIONAL BIOPSY Right    BREAST EXCISIONAL BIOPSY Right    BREAST EXCISIONAL BIOPSY Right    BUNIONECTOMY Bilateral     COLONOSCOPY  08/2013   HAND SURGERY Right    carpel tunnel surgery   INJECTION KNEE Bilateral 10/30/2021   Procedure: BILATERAL KNEE INJECTIONS;  Surgeon: Leandrew Koyanagi, MD;  Location: [place];  Service: Orthopedics;  Laterality: Bilateral;   MULTIPLE TOOTH EXTRACTIONS     full dentures   SHOULDER ARTHROSCOPY WITH ROTATOR CUFF REPAIR AND SUBACROMIAL DECOMPRESSION Right 10/30/2021   Procedure: RIGHT SHOULDER ARTHROSCOPY WITH ROTATOR CUFF REPAIR AND SUBACROMIAL DECOMPRESSION, DEBRIDEMENT;  Surgeon: Leandrew Koyanagi, MD;  Location: [place];  Service: Orthopedics;  Laterality: Right;   TRIGGER FINGER RELEASE Bilateral    thumbs    Family History  Problem Relation Age of Onset   Diabetes Mother    Prostate cancer Father    Colon cancer Neg Hx    Colon polyps Neg Hx    Rectal cancer Neg Hx    Stomach cancer Neg Hx     Social History   Socioeconomic History   Marital status: Married    Spouse name: Not on file   Number of children: Not on file   Years of education: Not on file   Highest education level: Not on file  Occupational History   Not on file  Tobacco Use   Smoking status: Never   Smokeless tobacco: Never  Vaping Use   Vaping Use: Never used  Substance and Sexual Activity   Alcohol use: No   Drug use: No   Sexual activity: Not Currently    Birth control/protection: Post-menopausal    Comment: Hysterectomy  Other Topics Concern   Not on file  Social History Narrative   Not on file   Social Determinants of Health   Financial Resource Strain: Not on file  Food Insecurity: Not on file  Transportation Needs: Not on file  Physical Activity: Not on file  Stress: Not on file  Social Connections: Not on file  Intimate Partner Violence: Not on file    Review of Systems  Psychiatric/Behavioral:  Positive for sleep disturbance.   All other systems reviewed and are negative.   Vitals:   09/01/22 1206  BP: 114/72  Pulse: 96   Temp: (!) 97.5 F (36.4 C)  SpO2: 96%     Physical Exam Constitutional:      Appearance: She is obese.  HENT:     Head: Normocephalic and atraumatic.     Mouth/Throat:     Mouth: Mucous membranes are moist.     Comments: Mallampati 3, crowded oropharynx Eyes:     Extraocular Movements: Extraocular  movements intact.     Pupils: Pupils are equal, round, and reactive to light.  Cardiovascular:     Rate and Rhythm: Normal rate and regular rhythm.     Heart sounds: No murmur heard.    No friction rub.  Pulmonary:     Effort: No respiratory distress.     Breath sounds: No stridor. No wheezing or rhonchi.  Musculoskeletal:     Cervical back: No rigidity or tenderness.  Neurological:     Mental Status: She is alert.  Psychiatric:        Mood and Affect: Mood normal.    Assessment:  Chronic insomnia -Has tried multiple medications in the past with no LOC -Daridorexant seem to be helping -Currently on 50 mg  Insomnia related to medical condition  Encouraged to start exercising on a regular basis  Plan/Recommendations: Continue daridorexant 50 mg  Follow-up in 6 months  Graded exercises as tolerated    Sherrilyn Rist MD [facility] 09/01/2022, 12:08 PM  CC: Janifer Adie, MD

## 2022-09-01 NOTE — Patient Instructions (Signed)
I will see you back in 6 months  Refills for your sleep medication will be sent to pharmacy  Make sure you allow at least 3 to 4 hours of time before your bedtime where you do not taking too much extra fluids and any heavy meals should be 3 to 4 hours before bedtime  Call us with significant concerns

## 2022-09-08 ENCOUNTER — Ambulatory Visit
Admission: RE | Admit: 2022-09-08 | Discharge: 2022-09-08 | Disposition: A | Discharge status: Home / Self Care | Payer: Medicare (Managed Care) | Source: Ambulatory Visit | Attending: Family Medicine | Admitting: Family Medicine

## 2022-09-08 ENCOUNTER — Other Ambulatory Visit: Payer: Self-pay | Admitting: Family Medicine

## 2022-09-08 DIAGNOSIS — G8929 Other chronic pain: Secondary | ICD-10-CM

## 2022-09-08 DIAGNOSIS — M545 Low back pain, unspecified: Secondary | ICD-10-CM

## 2022-09-08 DIAGNOSIS — M797 Fibromyalgia: Secondary | ICD-10-CM

## 2022-09-12 ENCOUNTER — Encounter: Payer: Self-pay | Admitting: Gastroenterology

## 2022-10-30 ENCOUNTER — Encounter: Payer: Self-pay | Admitting: Nurse Practitioner

## 2022-10-30 ENCOUNTER — Ambulatory Visit (INDEPENDENT_AMBULATORY_CARE_PROVIDER_SITE_OTHER): Payer: Medicare (Managed Care) | Admitting: Nurse Practitioner

## 2022-10-30 ENCOUNTER — Other Ambulatory Visit (INDEPENDENT_AMBULATORY_CARE_PROVIDER_SITE_OTHER): Payer: Medicare (Managed Care)

## 2022-10-30 VITALS — BP 118/68 | HR 88 | Ht 64.0 in | Wt 227.2 lb

## 2022-10-30 DIAGNOSIS — K219 Gastro-esophageal reflux disease without esophagitis: Secondary | ICD-10-CM

## 2022-10-30 DIAGNOSIS — K625 Hemorrhage of anus and rectum: Secondary | ICD-10-CM

## 2022-10-30 DIAGNOSIS — R1310 Dysphagia, unspecified: Secondary | ICD-10-CM

## 2022-10-30 LAB — COMPREHENSIVE METABOLIC PANEL
ALT: 9 U/L (ref 0–35)
AST: 13 U/L (ref 0–37)
Albumin: 4.2 g/dL (ref 3.5–5.2)
Alkaline Phosphatase: 60 U/L (ref 39–117)
BUN: 9 mg/dL (ref 6–23)
CO2: 35 mEq/L — ABNORMAL HIGH (ref 19–32)
Calcium: 9.6 mg/dL (ref 8.4–10.5)
Chloride: 103 mEq/L (ref 96–112)
Creatinine, Ser: 0.99 mg/dL (ref 0.40–1.20)
GFR: 59.01 mL/min — ABNORMAL LOW (ref >60.00)
Glucose, Bld: 93 mg/dL (ref 70–99)
Potassium: 4.5 mEq/L (ref 3.5–5.1)
Sodium: 142 mEq/L (ref 135–145)
Total Bilirubin: 0.3 mg/dL (ref 0.2–1.2)
Total Protein: 7.8 g/dL (ref 6.0–8.3)

## 2022-10-30 LAB — CBC WITH DIFFERENTIAL/PLATELET
Basophils Absolute: 0 10*3/uL (ref 0.0–0.1)
Basophils Relative: 0.6 % (ref 0.0–3.0)
Eosinophils Absolute: 0 10*3/uL (ref 0.0–0.7)
Eosinophils Relative: 1 % (ref 0.0–5.0)
HCT: 36.1 % (ref 36.0–46.0)
Hemoglobin: 11.8 g/dL — ABNORMAL LOW (ref 12.0–15.0)
Lymphocytes Relative: 37 % (ref 12.0–46.0)
Lymphs Abs: 1.9 10*3/uL (ref 0.7–4.0)
MCHC: 32.8 g/dL (ref 30.0–36.0)
MCV: 92.2 fl (ref 78.0–100.0)
Monocytes Absolute: 0.4 10*3/uL (ref 0.1–1.0)
Monocytes Relative: 8.4 % (ref 3.0–12.0)
Neutro Abs: 2.7 10*3/uL (ref 1.4–7.7)
Neutrophils Relative %: 53 % (ref 43.0–77.0)
Platelets: 267 10*3/uL (ref 150.0–400.0)
RBC: 3.91 Mil/uL (ref 3.87–5.11)
RDW: 15.1 % (ref 11.5–15.5)
WBC: 5 10*3/uL (ref 4.0–10.5)

## 2022-10-30 MED ORDER — NA SULFATE-K SULFATE-MG SULF 17.5-3.13-1.6 GM/177ML PO SOLN: 1.0000 | Freq: Once | ORAL | 0 refills | Status: AC

## 2022-10-30 NOTE — Patient Instructions (Signed)
You have been scheduled for an endoscopy and colonoscopy. Please follow the written instructions given to you at your visit today. Please pick up your prep supplies at the pharmacy within the next 1-3 days. If you use inhalers (even only as needed), please bring them with you on the day of your procedure.   Your provider has requested that you go to the basement level for lab work before leaving today. Press "B" on the elevator. The lab is located at the first door on the left as you exit the elevator.   Continue Miralax at bedtime.  Avoid large pieces of meat, bread or rice.  You have been scheduled for a Barium Esophogram at [facility] (1st floor of the hospital) on 11/07/22 at 11 am. Please arrive 15 minutes prior to your appointment for registration. Make certain not to have anything to eat or drink 3 hours prior to your test. If you need to reschedule for any reason, please contact radiology at [phone] to do so. __________________________________________________________________ A barium swallow is an examination that concentrates on views of the esophagus. This tends to be a double contrast exam (barium and two liquids which, when combined, create a gas to distend the wall of the oesophagus) or single contrast (non-ionic iodine based). The study is usually tailored to your symptoms so a good history is essential. Attention is paid during the study to the form, structure and configuration of the esophagus, looking for functional disorders (such as aspiration, dysphagia, achalasia, motility and reflux) EXAMINATION You may be asked to change into a gown, depending on the type of swallow being performed. A radiologist and radiographer will perform the procedure. The radiologist will advise you of the type of contrast selected for your procedure and direct you during the exam. You will be asked to stand, sit or lie in several different positions and to hold a small amount of fluid in  your mouth before being asked to swallow while the imaging is performed .In some instances you may be asked to swallow barium coated marshmallows to assess the motility of a solid food bolus. The exam can be recorded as a digital or video fluoroscopy procedure. POST PROCEDURE It will take 1-2 days for the barium to pass through your system. To facilitate this, it is important, unless otherwise directed, to increase your fluids for the next 24-48hrs and to resume your normal diet.  This test typically takes about 30 minutes to perform. __________________________________________________________________________________   Due to recent changes in healthcare laws, you may see the results of your imaging and laboratory studies on MyChart before your provider has had a chance to review them.  We understand that in some cases there may be results that are confusing or concerning to you. Not all laboratory results come back in the same time frame and the provider may be waiting for multiple results in order to interpret others.  Please give [place] 48 hours in order for your provider to thoroughly review all the results before contacting the office for clarification of your results.    Thank you for trusting me with your gastrointestinal care!   Carl Best, CRNP

## 2022-10-30 NOTE — Progress Notes (Signed)
10/30/2022 [place] [id number] July 23, 1956   CHIEF COMPLAINT: Difficulty swallowing   HISTORY OF PRESENT ILLNESS:  Bridget [name] is a 66 year old female with a past medical history of anxiety, depression, schizoaffective disorder, fibromyalgia, osteoarthritis, chronic insomnia, DM II, chronic anemia, GERD, Barrett's esophagus, H. Pylori gastritis and colon polyps. Past hysterectomy.   She complains of having food gets stuck to the mid esophagus, she waits a few seconds and the food passes down into the stomach which comes and goes and occurs once or twice weekly for the past 2 months. She has heartburn which occurs once weekly for a few years. She takes Esomeprazole '40mg'$  once daily for the past year. History of Barrett's esophagus and H. Pylori per EGD at [facility] 09/2015. Dr. Loletha Carrow previously reviewed her 09/2015 EGD records and biopsy result and he questioned if she truly had Barrett's esophagus. She underwent an EGD by Dr. Loletha Carrow 01/07/2018 which was normal, no evidence of Barrett's esophagus. She also endorses having rectal bleeding with rectal pain x 2 months. She is passing a normal formed brown stool 3 to 4 days weekly and a darker brown or black stool 1 to 2 days weekly. She eats abut 10 oreo cookies daily and she questions if the oreo cookies cause black stools. No Pepto bismol and no recent oral iron. She occasionally passes a small amount of bright red blood seen on the toile tissue with mild rectal pain, last occurred one month ago. She occasionally takes Miralax. Her most recent colonoscopy was 124/2019 which showed two small tubular adenomatous polyps removed from the ascending and transverse colon. She was advised to repeat a colonoscopy in 5 years.  No known family history of colorectal cancer. She takes Celebrex daily for knee/back pain, arthritis and fibromyalgia related pain.  She attends [employer].   Labs 01/23/2020: WBC 4.2. Hg 11.2. HCT 354. PLT 353.  Glucose 117 otherwise normal CMP.  PAST GI PROCEDURES:   EGD 01/07/2018: Normal esophagus. No Barrett's esophagus seen. Therefore, no future surveillance EGD needed. - Normal stomach. - Normal examined duodenum. - No specimens collected.  Colonoscopy 01/07/2018: Two 2 to 4 mm polyps in the transverse colon and in the ascending colon, removed with a cold snare. Resected and retrieved. - The examination was otherwise normal. - Recall colonoscopy 5 years Surgical [P], transverse and ascending, polyp (2) - TUBULAR ADENOMA (2 OF 4 FRAGMENTS) - BENIGN COLONIC MUCOSA (2 OF 4 FRAGMENTS) - NO HIGH GRADE DYSPLASIA OR MALIGNANCY IDENTIFIED  EGD 09/26/2015;        Colonoscopy 09/18/2014 done for personal history of colon polyps and constipation.  Bowel prep was inadequate.  No polyps were seen.  Erythema and erosion at 55 cm.  Biopsy of the area showed reactive edematous colonic mucosa with erosion.  No dysplasia.   Colonoscopy September 2014.  Extent of exam to the cecum.  Inadequate bowel prep.  10 mm cecal polyp removed.  Path compatible tubular adenoma without high-grade dysplasia.    Past Medical History:  Diagnosis Date   Anxiety    Back pain    low back - arthritis   Barrett's esophagus    Chronic headaches    Constipation    miralax daily   Depression    Dry mouth    biotene   Fibromyalgia    GERD (gastroesophageal reflux disease)    Heart murmur    Hyperlipemia    Insomnia    Myoclonus  patient states "no longer an issue"   Osteoarthritis    lower back, shoulders, hands   Schizoaffective disorder (HCC)    SVD (spontaneous vaginal delivery)    x 3   Vertigo    Wears dentures    full   Past Surgical History:  Procedure Laterality Date   ABDOMINAL HYSTERECTOMY     BREAST EXCISIONAL BIOPSY Right    BREAST EXCISIONAL BIOPSY Right    BREAST EXCISIONAL BIOPSY Right    BREAST EXCISIONAL BIOPSY Right    BUNIONECTOMY Bilateral    COLONOSCOPY  08/2013   HAND  SURGERY Right    carpel tunnel surgery   INJECTION KNEE Bilateral 10/30/2021   Procedure: BILATERAL KNEE INJECTIONS;  Surgeon: Leandrew Koyanagi, MD;  Location: [place];  Service: Orthopedics;  Laterality: Bilateral;   MULTIPLE TOOTH EXTRACTIONS     full dentures   SHOULDER ARTHROSCOPY WITH ROTATOR CUFF REPAIR AND SUBACROMIAL DECOMPRESSION Right 10/30/2021   Procedure: RIGHT SHOULDER ARTHROSCOPY WITH ROTATOR CUFF REPAIR AND SUBACROMIAL DECOMPRESSION, DEBRIDEMENT;  Surgeon: Leandrew Koyanagi, MD;  Location: [place];  Service: Orthopedics;  Laterality: Right;   TRIGGER FINGER RELEASE Bilateral    thumbs   Social History: She is divorced. Disabled. She has 2 sons and 5 daughters. Nonsmoker. No alcohol use. No drug use.   Family History: Father with history of prostate cancer. Mother with diabetes. Sister with hypertension. No known family history of esophageal, gastric or colon cancer.   Allergies  Allergen Reactions   Flexeril [Cyclobenzaprine] Other (See Comments)    Urinary retention   Amitriptyline Other (See Comments)    Urinary retention   Penicillins Rash    Has patient had a PCN reaction causing immediate rash, facial/tongue/throat swelling, SOB or lightheadedness with hypotension: yes Has patient had a PCN reaction causing severe rash involving mucus membranes or skin necrosis: no Has patient had a PCN reaction that required hospitalization: No Has patient had a PCN reaction occurring within the last 10 years: No If all of the above answers are "NO", then may proceed with Cephalosporin use.       Outpatient Encounter Medications as of 10/30/2022  Medication Sig   ARIPiprazole (ABILIFY) 10 MG tablet Take 10 mg by mouth daily.   celecoxib (CELEBREX) 200 MG capsule Take 200 mg by mouth daily.   Daridorexant HCl 50 MG TABS Take 50 mg by mouth at bedtime.   DULoxetine (CYMBALTA) 60 MG capsule Take 60 mg by mouth 2 (two) times daily.   esomeprazole  (NEXIUM) 40 MG capsule Take 40 mg by mouth daily at 12 noon.   hydrOXYzine (VISTARIL) 25 MG capsule Take 1 capsule (25 mg total) by mouth 3 (three) times daily as needed for anxiety.   LORazepam (ATIVAN) 0.5 MG tablet Take 0.5 mg by mouth 2 (two) times daily.   methocarbamol (ROBAXIN) 500 MG tablet Take 1 tablet (500 mg total) by mouth 2 (two) times daily as needed.   polyethylene glycol (MIRALAX / GLYCOLAX) packet Take 17 g by mouth daily as needed.   risperiDONE (RISPERDAL) 1 MG tablet Take 1-2 mg by mouth See admin instructions. 1 MG at night for agitation and 2 MG at bedtime for sleep   rosuvastatin (CRESTOR) 20 MG tablet Take 20 mg by mouth daily.   sodium chloride (MURO 128) 5 % ophthalmic ointment Place 1 application into both eyes 4 (four) times daily as needed for eye irritation.   traMADol (ULTRAM) 50 MG tablet Take 1-2 tablets (  50-100 mg total) by mouth daily as needed.   No facility-administered encounter medications on file as of 10/30/2022.    REVIEW OF SYSTEMS:  Gen: Denies fever, sweats or chills. No weight loss.  CV: Denies chest pain, palpitations or edema. Resp: Denies cough, shortness of breath of hemoptysis.  GI: See HPI.  GU : Denies urinary burning, blood in urine, increased urinary frequency or incontinence. MS: + Arthritis and back pain.  Derm: Denies rash, itchiness, skin lesions or unhealing ulcers. Psych: + Depression.  Heme: Denies bruising, easy bleeding. Neuro:  Denies headaches, dizziness or paresthesias.  Endo:  Denies any problems with DM, thyroid or adrenal function.  PHYSICAL EXAM: BP 118/68 (BP Location: Left Arm, Patient Position: Sitting, Cuff Size: Large)   Pulse 88   Ht '5\' 4"'$  (1.626 m)   Wt 227 lb 4 oz (103.1 kg)   SpO2 99%   BMI 39.01 kg/m   Wt Readings from Last 3 Encounters:  10/30/22 227 lb 4 oz (103.1 kg)  09/01/22 219 lb 9.6 oz (99.6 kg)  05/27/22 226 lb (102.5 kg)    General: 66 year old female in NAD. Head: Normocephalic and  atraumatic. Eyes:  Sclerae non-icteric, conjunctive pink. Ears: Normal auditory acuity. Mouth: Full upper and partial lower dentures. No ulcers or lesions.  Neck: Supple, no lymphadenopathy or thyromegaly.  Lungs: Clear bilaterally to auscultation without wheezes, crackles or rhonchi. Heart: Regular rate and rhythm. No murmur, rub or gallop appreciated.  Abdomen: Soft, nontender, non distended. No masses. No hepatosplenomegaly. Normoactive bowel sounds x 4 quadrants.  Rectal: Internal hemorrhoids without prolapse. Brown stool in rectal vault guaiac negative. DD CMA present.  Musculoskeletal: Symmetrical with no gross deformities. Skin: Warm and dry. No rash or lesions on visible extremities. Extremities: No edema. Neurological: Alert oriented x 4. Response to questions is slow but appropriate.  Psychological:  Alert and cooperative. Normal mood and affect.  ASSESSMENT AND PLAN:  49) 66 year old female with a history of GERD, questionable Barrett's esophagus and H. Pylori with reflux symptoms, dysphagia x 2 months and dark brown or black stools 1 to 2 times weekly. Chronic Celebrex use. Stool guaiac negative. EGD in 2019 was normal, no Barrett's esophagus. -Barium swallow study  -EGD benefits and risks discussed including risk with sedation, risk of bleeding, perforation and infection  -Continue Esomeprazole '40mg'$  po QD  -Further recommendation   2) History of tubular adenomatous polyps per colonoscopy in 2014 and  2019. -Colonoscopy benefits and risks discussed including risk with sedation, risk of bleeding, perforation and infection  -2 day bowel prep  3) Rectal bleeding and rectal pain likely due to hemorrhoids. -See plan in # 2 -Miralax Q HS as tolerated  -Apply a small amount of Desitin inside the anal opening and to the external anal area tid as needed for anal or hemorrhoidal irritation/bleeding.   4) Chronic normocytic anemia  -CBC and CMP   CC:  Janifer Adie, MD

## 2022-11-02 DIAGNOSIS — R1310 Dysphagia, unspecified: Secondary | ICD-10-CM | POA: Insufficient documentation

## 2022-11-02 HISTORY — DX: Dysphagia, unspecified: R13.10

## 2022-11-06 ENCOUNTER — Emergency Department (HOSPITAL_BASED_OUTPATIENT_CLINIC_OR_DEPARTMENT_OTHER)
Admission: EM | Admit: 2022-11-06 | Discharge: 2022-11-06 | Disposition: A | Discharge status: Home / Self Care | Payer: Medicare (Managed Care) | Attending: Emergency Medicine | Admitting: Emergency Medicine

## 2022-11-06 ENCOUNTER — Other Ambulatory Visit: Payer: Self-pay

## 2022-11-06 ENCOUNTER — Emergency Department (HOSPITAL_BASED_OUTPATIENT_CLINIC_OR_DEPARTMENT_OTHER): Payer: Medicare (Managed Care)

## 2022-11-06 ENCOUNTER — Encounter (HOSPITAL_BASED_OUTPATIENT_CLINIC_OR_DEPARTMENT_OTHER): Payer: Self-pay | Admitting: Emergency Medicine

## 2022-11-06 DIAGNOSIS — R197 Diarrhea, unspecified: Secondary | ICD-10-CM | POA: Diagnosis not present

## 2022-11-06 DIAGNOSIS — R109 Unspecified abdominal pain: Secondary | ICD-10-CM | POA: Diagnosis present

## 2022-11-06 LAB — URINALYSIS, MICROSCOPIC (REFLEX)

## 2022-11-06 LAB — COMPREHENSIVE METABOLIC PANEL
ALT: 12 U/L (ref 0–44)
AST: 17 U/L (ref 15–41)
Albumin: 3.8 g/dL (ref 3.5–5.0)
Alkaline Phosphatase: 60 U/L (ref 38–126)
Anion gap: 7 (ref 5–15)
BUN: 11 mg/dL (ref 8–23)
CO2: 28 mmol/L (ref 22–32)
Calcium: 8.9 mg/dL (ref 8.9–10.3)
Chloride: 103 mmol/L (ref 98–111)
Creatinine, Ser: 1.28 mg/dL — ABNORMAL HIGH (ref 0.44–1.00)
GFR, Estimated: 46 mL/min — ABNORMAL LOW (ref >60)
Glucose, Bld: 104 mg/dL — ABNORMAL HIGH (ref 70–99)
Potassium: 4.1 mmol/L (ref 3.5–5.1)
Sodium: 138 mmol/L (ref 135–145)
Total Bilirubin: 0.2 mg/dL — ABNORMAL LOW (ref 0.3–1.2)
Total Protein: 7.8 g/dL (ref 6.5–8.1)

## 2022-11-06 LAB — CBC WITH DIFFERENTIAL/PLATELET
Abs Immature Granulocytes: 0.01 10*3/uL (ref 0.00–0.07)
Basophils Absolute: 0 10*3/uL (ref 0.0–0.1)
Basophils Relative: 0 %
Eosinophils Absolute: 0.1 10*3/uL (ref 0.0–0.5)
Eosinophils Relative: 2 %
HCT: 34.3 % — ABNORMAL LOW (ref 36.0–46.0)
Hemoglobin: 11.1 g/dL — ABNORMAL LOW (ref 12.0–15.0)
Immature Granulocytes: 0 %
Lymphocytes Relative: 24 %
Lymphs Abs: 1.3 10*3/uL (ref 0.7–4.0)
MCH: 30.1 pg (ref 26.0–34.0)
MCHC: 32.4 g/dL (ref 30.0–36.0)
MCV: 93 fL (ref 80.0–100.0)
Monocytes Absolute: 0.5 10*3/uL (ref 0.1–1.0)
Monocytes Relative: 9 %
Neutro Abs: 3.4 10*3/uL (ref 1.7–7.7)
Neutrophils Relative %: 65 %
Platelets: 248 10*3/uL (ref 150–400)
RBC: 3.69 MIL/uL — ABNORMAL LOW (ref 3.87–5.11)
RDW: 14.7 % (ref 11.5–15.5)
WBC: 5.3 10*3/uL (ref 4.0–10.5)
nRBC: 0 % (ref 0.0–0.2)

## 2022-11-06 LAB — URINALYSIS, ROUTINE W REFLEX MICROSCOPIC
Bilirubin Urine: NEGATIVE
Glucose, UA: NEGATIVE mg/dL
Hgb urine dipstick: NEGATIVE
Ketones, ur: NEGATIVE mg/dL
Nitrite: NEGATIVE
Protein, ur: NEGATIVE mg/dL
Specific Gravity, Urine: 1.015 (ref 1.005–1.030)
pH: 7 (ref 5.0–8.0)

## 2022-11-06 LAB — LIPASE, BLOOD: Lipase: 26 U/L (ref 11–51)

## 2022-11-06 MED ORDER — SODIUM CHLORIDE 0.9 % IV BOLUS
1000.0000 mL | Freq: Once | INTRAVENOUS | Status: AC
  Administered 2022-11-06: 1000 mL via INTRAVENOUS

## 2022-11-06 MED ORDER — DICYCLOMINE HCL 20 MG PO TABS: 20.0000 mg | ORAL_TABLET | Freq: Two times a day (BID) | ORAL | 0 refills | Status: DC

## 2022-11-06 MED ORDER — DICYCLOMINE HCL 10 MG PO CAPS
10.0000 mg | ORAL_CAPSULE | Freq: Once | ORAL | Status: AC
  Administered 2022-11-06: 10 mg via ORAL
  Filled 2022-11-06: qty 1

## 2022-11-06 NOTE — ED Notes (Signed)
Pt states that she is unable to provide urine sample as she has used the restroom prior to arriving to the emergency room. Will ask to provide one at a later time.

## 2022-11-06 NOTE — ED Triage Notes (Signed)
Pt reports eating "bad meat" today. Diarrhea and abdominal pain started 30 minutes after eating it.

## 2022-11-06 NOTE — Discharge Instructions (Signed)
You have some abdominal cramps and I think likely you have a stomach virus  Take Bentyl as needed for cramps  Stay hydrated  See your doctor for follow-up  Return to ER if you have worse cramps, severe abdominal pain, vomiting, fever

## 2022-11-06 NOTE — ED Provider Notes (Signed)
[facility] Provider Note   CSN: [id number] Arrival date & time: 11/06/22  2051     History  Chief Complaint  Patient presents with   Diarrhea   Abdominal Pain    Bridget [name] is a 66 y.o. female history of Barrett's esophagus, here presenting with her diarrhea and abdominal pain.  Patient states that she ate some meat provided by [facility].  She states that about 30 minutes later she had abdominal cramps and then also had diarrhea.   The history is provided by the patient.       Home Medications Prior to Admission medications   Medication Sig Start Date End Date Taking? Authorizing Provider  ARIPiprazole (ABILIFY) 10 MG tablet Take 10 mg by mouth daily.    [provider]  celecoxib (CELEBREX) 200 MG capsule Take 200 mg by mouth daily.    [provider]  Daridorexant HCl 50 MG TABS Take 50 mg by mouth at bedtime. 05/27/22   Olalere, Ernesto Rutherford, MD  DULoxetine (CYMBALTA) 60 MG capsule Take 60 mg by mouth 2 (two) times daily.    [provider]  esomeprazole (NEXIUM) 40 MG capsule Take 40 mg by mouth daily at 12 noon.    [provider]  hydrOXYzine (VISTARIL) 25 MG capsule Take 1 capsule (25 mg total) by mouth 3 (three) times daily as needed for anxiety. 01/24/20   Blanchie Dessert, MD  LORazepam (ATIVAN) 0.5 MG tablet Take 0.5 mg by mouth 2 (two) times daily.    [provider]  methocarbamol (ROBAXIN) 500 MG tablet Take 1 tablet (500 mg total) by mouth 2 (two) times daily as needed. 01/23/22   Aundra Dubin, PA-C  polyethylene glycol (MIRALAX / GLYCOLAX) packet Take 17 g by mouth daily as needed.    [provider]  risperiDONE (RISPERDAL) 1 MG tablet Take 1-2 mg by mouth See admin instructions. 1 MG at night for agitation and 2 MG at bedtime for sleep    [provider]  rosuvastatin (CRESTOR) 20 MG tablet Take 20 mg by mouth daily.    [provider]  sodium  chloride (MURO 128) 5 % ophthalmic ointment Place 1 application into both eyes 4 (four) times daily as needed for eye irritation.    [provider]  traMADol (ULTRAM) 50 MG tablet Take 1-2 tablets (50-100 mg total) by mouth daily as needed. 03/13/22   Aundra Dubin, PA-C      Allergies    Flexeril [cyclobenzaprine], Amitriptyline, and Penicillins    Review of Systems   Review of Systems  Gastrointestinal:  Positive for abdominal pain and diarrhea.  All other systems reviewed and are negative.   Physical Exam Updated Vital Signs BP (!) 150/91 (BP Location: Right Arm)   Pulse 92   Temp 97.6 F (36.4 C) (Oral)   Resp 18   Ht '5\' 4"'$  (1.626 m)   Wt 103 kg   SpO2 100%   BMI 38.96 kg/m  Physical Exam Vitals and nursing note reviewed.  Constitutional:      Appearance: She is well-developed.  HENT:     Head: Normocephalic.     Mouth/Throat:     Mouth: Mucous membranes are moist.     Pharynx: Oropharynx is clear.  Eyes:     Extraocular Movements: Extraocular movements intact.     Pupils: Pupils are equal, round, and reactive to light.  Cardiovascular:     Rate and Rhythm: Normal rate  and regular rhythm.     Heart sounds: Normal heart sounds.  Pulmonary:     Effort: Pulmonary effort is normal.     Breath sounds: Normal breath sounds.  Abdominal:     General: Abdomen is flat.     Comments: Mild diffuse tenderness but no focal tenderness.  No rebound   Skin:    General: Skin is warm.  Neurological:     General: No focal deficit present.     Mental Status: She is alert and oriented to person, place, and time.  Psychiatric:        Mood and Affect: Mood normal.        Behavior: Behavior normal.     ED Results / Procedures / Treatments   Labs (all labs ordered are listed, but only abnormal results are displayed) Labs Reviewed  COMPREHENSIVE METABOLIC PANEL - Abnormal; Notable for the following components:      Result Value   Glucose, Bld 104 (*)     Creatinine, Ser 1.28 (*)    Total Bilirubin 0.2 (*)    GFR, Estimated 46 (*)    All other components within normal limits  CBC WITH DIFFERENTIAL/PLATELET - Abnormal; Notable for the following components:   RBC 3.69 (*)    Hemoglobin 11.1 (*)    HCT 34.3 (*)    All other components within normal limits  LIPASE, BLOOD  URINALYSIS, ROUTINE W REFLEX MICROSCOPIC    EKG None  Radiology No results found.  Procedures Procedures    Medications Ordered in ED Medications  sodium chloride 0.9 % bolus 1,000 mL (has no administration in time range)  dicyclomine (BENTYL) capsule 10 mg (has no administration in time range)    ED Course/ Medical Decision Making/ A&P                           Medical Decision Making Bridget [name] is a 66 y.o. female here presenting with abdominal cramps after eating food.  I think likely viral gastroenteritis.  Patient has no vomiting.  Will check CBC CMP and lipase.  Will also get acute abdominal series.  11:28 PM Reviewed patient's labs and independently interpreted imaging study.  Labs and x-ray unremarkable.  Patient actually had some constipation on the x-ray.  Patient felt better after Bentyl.  I recommend Bentyl as needed.  She likely has viral gastroenteritis.   Problems Addressed: Abdominal cramping: acute illness or injury Diarrhea, unspecified type: acute illness or injury  Amount and/or Complexity of Data Reviewed Labs: ordered. Decision-making details documented in ED Course. Radiology: ordered and independent interpretation performed. Decision-making details documented in ED Course.  Risk Prescription drug management.   Final Clinical Impression(s) / ED Diagnoses Final diagnoses:  None    Rx / DC Orders ED Discharge Orders     None         Drenda Freeze, MD 11/06/22 2329

## 2022-11-07 ENCOUNTER — Ambulatory Visit (HOSPITAL_COMMUNITY): Payer: Medicare (Managed Care) | Attending: Nurse Practitioner

## 2022-11-07 NOTE — Progress Notes (Signed)
____________________________________________________________  Attending physician addendum:  Thank you for sending this case to me. I have reviewed the entire note and agree with the plan.  Dulcolax 20 mg followed by split dose GoLytely bowel prep will be sufficient for bowel prep, and easier than 2-day prep.  Wilfrid Lund, MD  ____________________________________________________________

## 2022-11-09 NOTE — Progress Notes (Signed)
Bridget [name], disregard my prior msg for 2 day bowel prep and provide patient with Dr. Loletha Carrow' prep recommendations as noted in his addendum note. THX

## 2022-11-10 ENCOUNTER — Telehealth: Payer: Self-pay | Admitting: Nurse Practitioner

## 2022-11-10 NOTE — Telephone Encounter (Signed)
Dr. Arlyss Gandy called from [facility] regarding the patients status and next treatment plan. Requested a back at [phone].

## 2022-11-11 ENCOUNTER — Ambulatory Visit: Payer: Medicare (Managed Care) | Admitting: Gastroenterology

## 2022-11-11 NOTE — Telephone Encounter (Signed)
Left message for Dr. Arlyss Gandy to call back: Direct number provided:

## 2022-11-12 ENCOUNTER — Telehealth: Payer: Self-pay

## 2022-11-12 ENCOUNTER — Other Ambulatory Visit: Payer: Self-pay

## 2022-11-12 DIAGNOSIS — K625 Hemorrhage of anus and rectum: Secondary | ICD-10-CM

## 2022-11-12 DIAGNOSIS — R1310 Dysphagia, unspecified: Secondary | ICD-10-CM

## 2022-11-12 DIAGNOSIS — K219 Gastro-esophageal reflux disease without esophagitis: Secondary | ICD-10-CM

## 2022-11-12 MED ORDER — PEG 3350-KCL-NA BICARB-NACL 420 G PO SOLR: 4000.0000 mL | Freq: Once | ORAL | 0 refills | Status: AC

## 2022-11-12 NOTE — Telephone Encounter (Signed)
New prep instructions were created for pt: Instructions sent to pt via mail:  Prescription was sent to pharmacy: Left message for pt to call back

## 2022-11-12 NOTE — Telephone Encounter (Signed)
Progress Notes  Noralyn Pick, NP at 10/30/2022  2:30 PM  Status: Signed  Remo Lipps, disregard my prior msg for 2 day bowel prep and provide patient with Dr. Loletha Carrow' prep recommendations as noted in his addendum note. Marvis Repress, MD at 10/30/2022  2:30 PM  Status: Signed  ____________________________________________________________   Attending physician addendum:   Thank you for sending this case to me. I have reviewed the entire note and agree with the plan.   Dulcolax 20 mg followed by split dose GoLytely bowel prep will be sufficient for bowel prep, and easier than 2-day prep.   Wilfrid Lund, MD

## 2022-11-13 ENCOUNTER — Ambulatory Visit (INDEPENDENT_AMBULATORY_CARE_PROVIDER_SITE_OTHER): Payer: Medicare (Managed Care) | Admitting: Orthopaedic Surgery

## 2022-11-13 DIAGNOSIS — M170 Bilateral primary osteoarthritis of knee: Secondary | ICD-10-CM

## 2022-11-13 MED ORDER — BUPIVACAINE HCL 0.5 % IJ SOLN
2.0000 mL | INTRAMUSCULAR | Status: AC | PRN
  Administered 2022-11-13: 2 mL via INTRA_ARTICULAR

## 2022-11-13 MED ORDER — LIDOCAINE HCL 1 % IJ SOLN
2.0000 mL | INTRAMUSCULAR | Status: AC | PRN
  Administered 2022-11-13: 2 mL

## 2022-11-13 MED ORDER — METHYLPREDNISOLONE ACETATE 40 MG/ML IJ SUSP
40.0000 mg | INTRAMUSCULAR | Status: AC | PRN
  Administered 2022-11-13: 40 mg via INTRA_ARTICULAR

## 2022-11-13 NOTE — Progress Notes (Signed)
Office Visit Note   Patient: Bridget [name]           Date of Birth: August 29, 1956           MRN: [id number] Visit Date: 11/13/2022              Requested by: Janifer Adie, MD [address] [place],  [place] [address] PCP: Janifer Adie, MD   Assessment & Plan: Visit Diagnoses:  1. Bilateral primary osteoarthritis of knee     Plan: Impression is bilateral knee osteoarthritis.  Both knees injected with cortisone today.  She tolerates well.  We will see her back as needed.  Follow-Up Instructions: No follow-ups on file.   Orders:  No orders of the defined types were placed in this encounter. Vadis returns today for No orders of the defined types were placed in this encounter.     Procedures: Large Joint Inj: bilateral knee on 11/13/2022 10:09 AM Indications: pain Details: 22 G needle  Arthrogram: No  Medications (Right): 2 mL lidocaine 1 %; 2 mL bupivacaine 0.5 %; 40 mg methylPREDNISolone acetate 40 MG/ML Medications (Left): 2 mL lidocaine 1 %; 2 mL bupivacaine 0.5 %; 40 mg methylPREDNISolone acetate 40 MG/ML Outcome: tolerated well, no immediate complications Patient was prepped and draped in the usual sterile fashion.       Clinical Data: No additional findings.   Subjective: Chief Complaint  Patient presents with   Right Knee - Pain   Left Knee - Pain    HPI Bridget [name] returns today for follow-up of bilateral knee osteoarthritis.  Requesting cortisone injections today.  Underwent viscosupplementation injections in March of this year.  She did feel improvement from these injections but they have worn off now.  Review of Systems   Objective: Vital Signs: There were no vitals taken for this visit.  Physical Exam  Ortho Exam Examination of bilateral knees is unchanged.  No joint effusion. Specialty Comments:  No specialty comments available.  Imaging: No results found.   PMFS History: Patient Active Problem List   Diagnosis Date Noted    Dysphagia 11/02/2022   Primary osteoarthritis of right knee 01/28/2022   Primary osteoarthritis of left knee 01/28/2022   Complete tear of right rotator cuff 10/30/2021   GAD (generalized anxiety disorder) 01/24/2020   Schizoaffective disorder, bipolar type ([place]) 02/04/2018   Past Medical History:  Diagnosis Date   Anxiety    Back pain    low back - arthritis   Barrett's esophagus    Chronic headaches    Constipation    miralax daily   Depression    Dry mouth    biotene   Fibromyalgia    GERD (gastroesophageal reflux disease)    Heart murmur    Hyperlipemia    Insomnia    Myoclonus    patient states "no longer an issue"   Osteoarthritis    lower back, shoulders, hands   Schizoaffective disorder (HCC)    SVD (spontaneous vaginal delivery)    x 3   Vertigo    Wears dentures    full    Family History  Problem Relation Age of Onset   Diabetes Mother    Prostate cancer Father    High blood pressure Sister    Colon cancer Neg Hx    Colon polyps Neg Hx    Rectal cancer Neg Hx    Stomach cancer Neg Hx     Past Surgical History:  Procedure Laterality Date  ABDOMINAL HYSTERECTOMY     BREAST EXCISIONAL BIOPSY Right    BREAST EXCISIONAL BIOPSY Right    BREAST EXCISIONAL BIOPSY Right    BREAST EXCISIONAL BIOPSY Right    BUNIONECTOMY Bilateral    COLONOSCOPY  08/2013   HAND SURGERY Right    carpel tunnel surgery   INJECTION KNEE Bilateral 10/30/2021   Procedure: BILATERAL KNEE INJECTIONS;  Surgeon: Leandrew Koyanagi, MD;  Location: [facility];  Service: Orthopedics;  Laterality: Bilateral;   MULTIPLE TOOTH EXTRACTIONS     full dentures   SHOULDER ARTHROSCOPY WITH ROTATOR CUFF REPAIR AND SUBACROMIAL DECOMPRESSION Right 10/30/2021   Procedure: RIGHT SHOULDER ARTHROSCOPY WITH ROTATOR CUFF REPAIR AND SUBACROMIAL DECOMPRESSION, DEBRIDEMENT;  Surgeon: Leandrew Koyanagi, MD;  Location: [facility];  Service: Orthopedics;  Laterality: Right;   TRIGGER  FINGER RELEASE Bilateral    thumbs   Social History   Occupational History   Not on file  Tobacco Use   Smoking status: Never   Smokeless tobacco: Never  Vaping Use   Vaping Use: Never used  Substance and Sexual Activity   Alcohol use: No   Drug use: No   Sexual activity: Not Currently    Birth control/protection: Post-menopausal    Comment: Hysterectomy

## 2022-11-14 NOTE — Telephone Encounter (Signed)
Pt made aware of Dr. Loletha Carrow recommendations:  Pt was made aware of Updated prep instructions and prescription sent to pharmacy: Prep instructions were sent to pt via mail: Pt made aware: Pt verbalized understanding with all questions answered.

## 2022-11-14 NOTE — Telephone Encounter (Signed)
Dr. Arlyss Gandy questioned if pt was going to have an EGD along with her Colonoscopy: Chart reviewed and noted that she was to have both: Dr. Arlyss Gandy notified

## 2022-12-19 ENCOUNTER — Encounter: Payer: Self-pay | Admitting: Physical Medicine & Rehabilitation

## 2022-12-29 ENCOUNTER — Encounter: Payer: Medicare (Managed Care) | Admitting: Gastroenterology

## 2022-12-31 ENCOUNTER — Telehealth: Payer: Self-pay | Admitting: Nurse Practitioner

## 2022-12-31 ENCOUNTER — Other Ambulatory Visit: Payer: Self-pay

## 2022-12-31 NOTE — Telephone Encounter (Signed)
Inbound call from [facility] stating that patient was scheduled to have colonoscopy on 1/15 and had to cancel and had already taken her prep medication. Patient has been rescheduled to have procedure on 2/15 and is in need of a new prep sent. Requesting prescription be sent to [facility]

## 2023-01-01 ENCOUNTER — Other Ambulatory Visit: Payer: Self-pay

## 2023-01-01 DIAGNOSIS — K625 Hemorrhage of anus and rectum: Secondary | ICD-10-CM

## 2023-01-01 DIAGNOSIS — R1310 Dysphagia, unspecified: Secondary | ICD-10-CM

## 2023-01-01 DIAGNOSIS — K219 Gastro-esophageal reflux disease without esophagitis: Secondary | ICD-10-CM

## 2023-01-01 MED ORDER — PEG 3350-KCL-NA BICARB-NACL 420 G PO SOLR: ORAL | 0 refills | Status: DC

## 2023-01-01 NOTE — Telephone Encounter (Signed)
Spoke with Janett Billow at [facility] who stated that pt Colonoscopy has to be rescheduled and that pt had already took her Prep: Janett Billow was notified that I was going to send a new prep in for the pt to pt pharmacy and send prep instructions to pt address: Prescription sent to pharmacy.  Prep Instructions created and sent to pt address:  Left message for pt to call back:

## 2023-01-02 NOTE — Telephone Encounter (Signed)
Pt was notified that I sent a prescription in to her pharmacy for her Colonoscopy Prep: Pt notified that prep instructions were created for pt and sent via mail. Pt made aware Pt verbalized understanding with all questions answered.

## 2023-01-29 ENCOUNTER — Encounter: Payer: Self-pay | Admitting: Gastroenterology

## 2023-01-29 ENCOUNTER — Ambulatory Visit: Payer: Medicare (Managed Care) | Admitting: Gastroenterology

## 2023-01-29 VITALS — BP 139/77 | HR 85 | Temp 96.9°F | Resp 18 | Ht 64.0 in | Wt 227.0 lb

## 2023-01-29 DIAGNOSIS — R1310 Dysphagia, unspecified: Secondary | ICD-10-CM | POA: Diagnosis not present

## 2023-01-29 DIAGNOSIS — D125 Benign neoplasm of sigmoid colon: Secondary | ICD-10-CM

## 2023-01-29 DIAGNOSIS — D122 Benign neoplasm of ascending colon: Secondary | ICD-10-CM

## 2023-01-29 DIAGNOSIS — K219 Gastro-esophageal reflux disease without esophagitis: Secondary | ICD-10-CM

## 2023-01-29 DIAGNOSIS — K625 Hemorrhage of anus and rectum: Secondary | ICD-10-CM

## 2023-01-29 DIAGNOSIS — K635 Polyp of colon: Secondary | ICD-10-CM

## 2023-01-29 DIAGNOSIS — R1319 Other dysphagia: Secondary | ICD-10-CM

## 2023-01-29 MED ORDER — SODIUM CHLORIDE 0.9 % IV SOLN: 500.0000 mL | INTRAVENOUS | Status: DC

## 2023-01-29 NOTE — Progress Notes (Signed)
Report to PACU, RN, vss, BBS= Clear.  

## 2023-01-29 NOTE — Progress Notes (Signed)
History and Physical:  This patient presents for endoscopic testing for: Encounter Diagnoses  Name Primary?   Gastroesophageal reflux disease, unspecified whether esophagitis present Yes   Rectal bleeding    Esophageal dysphagia     67 year old [name] here for endoscopic evaluation of above-noted symptoms.  Clinical details in November Bridget [name], 2024 [facility] office consult note. Heartburn, dysphagia, rectal pain and bleeding.  Colon polyps and January 2019.  Previous reported history of Barrett's esophagus at [facility], none discovered on my upper endoscopy January 2019.  Patient is otherwise without complaints or active issues today.   Past Medical History: Past Medical History:  Diagnosis Date   Anxiety    Back pain    low back - arthritis   Barrett's esophagus    Chronic headaches    Constipation    miralax daily   Depression    Dry mouth    biotene   Fibromyalgia    GERD (gastroesophageal reflux disease)    Heart murmur    Hyperlipemia    Insomnia    Myoclonus    patient states "no longer an issue"   Osteoarthritis    lower back, shoulders, hands   Schizoaffective disorder (HCC)    SVD (spontaneous vaginal delivery)    x 3   Vertigo    Wears dentures    full     Past Surgical History: Past Surgical History:  Procedure Laterality Date   ABDOMINAL HYSTERECTOMY     BREAST EXCISIONAL BIOPSY Right    BREAST EXCISIONAL BIOPSY Right    BREAST EXCISIONAL BIOPSY Right    BREAST EXCISIONAL BIOPSY Right    BUNIONECTOMY Bilateral    COLONOSCOPY  08/2013   HAND SURGERY Right    carpel tunnel surgery   INJECTION KNEE Bilateral 11/Bridget [name]/2022   Procedure: BILATERAL KNEE INJECTIONS;  Surgeon: Leandrew Koyanagi, MD;  Location: [facility];  Service: Orthopedics;  Laterality: Bilateral;   MULTIPLE TOOTH EXTRACTIONS     full dentures   SHOULDER ARTHROSCOPY WITH ROTATOR CUFF REPAIR AND SUBACROMIAL DECOMPRESSION Right 11/Bridget [name]/2022   Procedure: RIGHT SHOULDER  ARTHROSCOPY WITH ROTATOR CUFF REPAIR AND SUBACROMIAL DECOMPRESSION, DEBRIDEMENT;  Surgeon: Leandrew Koyanagi, MD;  Location: [facility];  Service: Orthopedics;  Laterality: Right;   TRIGGER FINGER RELEASE Bilateral    thumbs    Allergies: Allergies  Allergen Reactions   Flexeril [Cyclobenzaprine] Other (See Comments)    Urinary retention   Amitriptyline Other (See Comments)    Urinary retention   Penicillins Rash    Has patient had a PCN reaction causing immediate rash, facial/tongue/throat swelling, SOB or lightheadedness with hypotension: yes Has patient had a PCN reaction causing severe rash involving mucus membranes or skin necrosis: no Has patient had a PCN reaction that required hospitalization: No Has patient had a PCN reaction occurring within the last 10 years: No If all of the above answers are "NO", then may proceed with Cephalosporin use.     Outpatient Meds: Current Outpatient Medications  Medication Sig Dispense Refill   ARIPiprazole (ABILIFY) 10 MG tablet Take 10 mg by mouth daily.     celecoxib (CELEBREX) 200 MG capsule Take 200 mg by mouth daily.     Daridorexant HCl 50 MG TABS Take 50 mg by mouth at bedtime. 30 tablet 5   dicyclomine (BENTYL) 20 MG tablet Take 1 tablet (20 mg total) by mouth 2 (two) times daily. 20 tablet 0   DULoxetine (CYMBALTA) 60 MG capsule Take 60 mg by  mouth 2 (two) times daily.     esomeprazole (NEXIUM) 40 MG capsule Take 40 mg by mouth daily at 12 noon.     hydrOXYzine (VISTARIL) 25 MG capsule Take 1 capsule (25 mg total) by mouth 3 (three) times daily as needed for anxiety. 30 capsule 0   ibuprofen (ADVIL) 800 MG tablet Take 800 mg by mouth every 8 (eight) hours as needed.     LORazepam (ATIVAN) 0.5 MG tablet Take 0.5 mg by mouth 2 (two) times daily.     risperiDONE (RISPERDAL) 1 MG tablet Take 1-2 mg by mouth See admin instructions. 1 MG at night for agitation and 2 MG at bedtime for sleep     rosuvastatin (CRESTOR) 20 MG  tablet Take 20 mg by mouth daily.     sodium chloride (MURO 128) 5 % ophthalmic ointment Place 1 application into both eyes 4 (four) times daily as needed for eye irritation.     traMADol (ULTRAM) 50 MG tablet Take 1-2 tablets (50-100 mg total) by mouth daily as needed. 20 tablet 0   methocarbamol (ROBAXIN) 500 MG tablet Take 1 tablet (500 mg total) by mouth 2 (two) times daily as needed. 20 tablet 0   polyethylene glycol (MIRALAX / GLYCOLAX) packet Take 17 g by mouth daily as needed.     Current Facility-Administered Medications  Medication Dose Route Frequency Provider Last Rate Last Admin   0.9 %  sodium chloride infusion  500 mL Intravenous Continuous Danis, Estill Cotta III, MD          ___________________________________________________________________ Objective   Exam:  BP 132/78   Pulse 84   Temp (!) 96.9 F (36.1 C)   Ht 5' 4"$  (1.626 m)   Wt 227 lb (103 kg)   SpO2 99%   BMI 38.96 kg/m   CV: regular , S1/S2 Resp: clear to auscultation bilaterally, normal RR and effort noted GI: soft, no tenderness, with active bowel sounds.   Assessment: Encounter Diagnoses  Name Primary?   Gastroesophageal reflux disease, unspecified whether esophagitis present Yes   Rectal bleeding    Esophageal dysphagia      Plan: Colonoscopy EGD with dilation   The patient is appropriate for an endoscopic procedure in the ambulatory setting.   - Wilfrid Lund, MD

## 2023-01-29 NOTE — Patient Instructions (Addendum)
-Handout on polyps and anti reflux provided - await on pathology results  -continue present medications. Continue Miralax, encourage plenty of dietary fiber intake, adequate fluid and physical activity  to improve constipation  -see procedure notes for other recommendations   YOU HAD AN ENDOSCOPIC PROCEDURE TODAY AT [facility] :   Refer to the procedure report that was given to you for any specific questions about what was found during the examination.  If the procedure report does not answer your questions, please call your gastroenterologist to clarify.  If you requested that your care partner not be given the details of your procedure findings, then the procedure report has been included in a sealed envelope for you to review at your convenience later.  YOU SHOULD EXPECT: Some feelings of bloating in the abdomen. Passage of more gas than usual.  Walking can help get rid of the air that was put into your GI tract during the procedure and reduce the bloating. If you had a lower endoscopy (such as a colonoscopy or flexible sigmoidoscopy) you may notice spotting of blood in your stool or on the toilet paper. If you underwent a bowel prep for your procedure, you may not have a normal bowel movement for a few days.  Please Note:  You might notice some irritation and congestion in your nose or some drainage.  This is from the oxygen used during your procedure.  There is no need for concern and it should clear up in a day or so.  SYMPTOMS TO REPORT IMMEDIATELY:  Following lower endoscopy (colonoscopy or flexible sigmoidoscopy):  Excessive amounts of blood in the stool  Significant tenderness or worsening of abdominal pains  Swelling of the abdomen that is new, acute  Fever of 100F or higher  Following upper endoscopy (EGD)  Vomiting of blood or coffee ground material  New chest pain or pain under the shoulder blades  Painful or persistently difficult swallowing  New shortness of  breath  Fever of 100F or higher  Black, tarry-looking stools  For urgent or emergent issues, a gastroenterologist can be reached at any hour by calling [phone]. Do not use MyChart messaging for urgent concerns.    DIET:  We do recommend a small meal at first, but then you may proceed to your regular diet.  Drink plenty of fluids but you should avoid alcoholic beverages for 24 hours.  ACTIVITY:  You should plan to take it easy for the rest of today and you should NOT DRIVE or use heavy machinery until tomorrow (because of the sedation medicines used during the test).    FOLLOW UP: Our staff will call the number listed on your records the next business day following your procedure.  We will call around 7:15- 8:00 am to check on you and address any questions or concerns that you may have regarding the information given to you following your procedure. If we do not reach you, we will leave a message.     If any biopsies were taken you will be contacted by phone or by letter within the next 1-3 weeks.  Please call us at [phone] if you have not heard about the biopsies in 3 weeks.    SIGNATURES/CONFIDENTIALITY: You and/or your care partner have signed paperwork which will be entered into your electronic medical record.  These signatures attest to the fact that that the information above on your After Visit Summary has been reviewed and is understood.  Full responsibility of the  confidentiality of this discharge information lies with you and/or your care-partner.

## 2023-01-29 NOTE — Op Note (Signed)
[facility] Patient Name: Bridget [name] Procedure Date: 01/29/2023 9:20 AM MRN: [id number] Endoscopist: Mallie Mussel L. Loletha Carrow , MD, [id number] Age: 67 Referring MD:  Date of Birth: Aug 09, 1956 Gender: Female Account #: [id number] Procedure:                Colonoscopy Indications:              Rectal bleeding, Constipation, Rectal pain                           This patient also had 2 subcentimeter adenomatous                            polyps on last colonoscopy January 2019 Medicines:                Monitored Anesthesia Care Procedure:                Pre-Anesthesia Assessment:                           - Prior to the procedure, a History and Physical                            was performed, and patient medications and                            allergies were reviewed. The patient's tolerance of                            previous anesthesia was also reviewed. The risks                            and benefits of the procedure and the sedation                            options and risks were discussed with the patient.                            All questions were answered, and informed consent                            was obtained. Prior Anticoagulants: The patient has                            taken no anticoagulant or antiplatelet agents. ASA                            Grade Assessment: III - A patient with severe                            systemic disease. After reviewing the risks and                            benefits, the patient was deemed in satisfactory  condition to undergo the procedure.                           After obtaining informed consent, the colonoscope                            was passed under direct vision. Throughout the                            procedure, the patient's blood pressure, pulse, and                            oxygen saturations were monitored continuously. The                            CF [id number]  was introduced through the anus                            and advanced to the the cecum, identified by                            appendiceal orifice and ileocecal valve. The                            colonoscopy was somewhat difficult due to a                            redundant colon. Successful completion of the                            procedure was aided by using manual pressure and                            straightening and shortening the scope to obtain                            bowel loop reduction. The patient tolerated the                            procedure well. The quality of the bowel                            preparation was good (with lavage required, more so                            on right colon). The ileocecal valve, appendiceal                            orifice, and rectum were photographed. The bowel                            preparation used was GoLYTELY via split dose  instruction. Scope In: 9:41:02 AM Scope Out: 9:57:20 AM Scope Withdrawal Time: 0 hours 11 minutes 36 seconds  Total Procedure Duration: 0 hours 16 minutes 18 seconds  Findings:                 The perianal and digital rectal examinations were                            normal.                           Repeat examination of right colon under NBI                            performed.                           A diminutive polyp was found in the proximal                            ascending colon. The polyp was sessile and                            adenomatous-appearing. The polyp was removed with a                            cold snare. Resection was complete, but the polyp                            tissue was not retrieved.                           A diminutive polyp was found in the sigmoid colon.                            The polyp was sessile. The polyp was removed with a                            cold snare. Resection and retrieval were complete.                            The sigmoid colon was mildly redundant.                           An area of melanosis was found in the entire colon.                           Retroflexion in the rectum was not performed due to                            anatomy.                           The exam was otherwise without abnormality. No  other rectal abnormalities including hemorrhoids on                            careful and repeat forward view exam.                           Benign anal bleeding related to constipation.                            Constipation may at least in part be due to                            medication side effect. Complications:            No immediate complications. Estimated Blood Loss:     Estimated blood loss was minimal. Impression:               - One diminutive polyp in the proximal ascending                            colon, removed with a cold snare. Complete                            resection. Polyp tissue not retrieved.                           - One diminutive polyp in the sigmoid colon,                            removed with a cold snare. Resected and retrieved.                           - Redundant colon.                           - Melanosis in the colon.                           - The examination was otherwise normal. Recommendation:           - Patient has a contact number available for                            emergencies. The signs and symptoms of potential                            delayed complications were discussed with the                            patient. Return to normal activities tomorrow.                            Written discharge instructions were provided to the                            patient.                           -  Resume previous diet.                           - Continue present medications.                           - Await pathology results.                           - Repeat colonoscopy  is recommended for                            surveillance. The colonoscopy date will be                            determined after pathology results from today's                            exam become available for review.                           - Continue MiraLAX, encourage plenty of dietary                            fiber intake, adequate fluids and physical activity                            to improve constipation. Slightly elevate feet when                            toileting.                           I would prefer not to add prescription medications                            for constipation to this patient's current                            polypharmacy. Teofilo Lupinacci L. Loletha Carrow, MD 01/29/2023 10:11:54 AM This report has been signed electronically.

## 2023-01-29 NOTE — Progress Notes (Signed)
Pt's states no medical or surgical changes since previsit or office visit. 

## 2023-01-29 NOTE — Op Note (Signed)
[facility] Patient Name: Bridget [name] Procedure Date: 01/29/2023 9:26 AM MRN: [id number] Endoscopist: Bridget [name]. Bridget [name] , MD, [id number] Age: 68 Referring MD:  Date of Birth: [birth year] Gender: Female Account #: [id number] Procedure:                Upper GI endoscopy Indications:              Esophageal dysphagia, Heartburn Medicines:                Monitored Anesthesia Care Procedure:                Pre-Anesthesia Assessment:                           - Prior to the procedure, a History and Physical                            was performed, and patient medications and                            allergies were reviewed. The patient's tolerance of                            previous anesthesia was also reviewed. The risks                            and benefits of the procedure and the sedation                            options and risks were discussed with the patient.                            All questions were answered, and informed consent                            was obtained. Prior Anticoagulants: The patient has                            taken no anticoagulant or antiplatelet agents. ASA                            Grade Assessment: III - A patient with severe                            systemic disease. After reviewing the risks and                            benefits, the patient was deemed in satisfactory                            condition to undergo the procedure.                           After obtaining informed consent, the endoscope was  passed under direct vision. Throughout the                            procedure, the patient's blood pressure, pulse, and                            oxygen saturations were monitored continuously. The                            Olympus Scope [id number] was introduced through the                            mouth, and advanced to the second part of duodenum.                            The upper GI  endoscopy was accomplished without                            difficulty. The patient tolerated the procedure                            well. Scope In: Scope Out: Findings:                 There is no endoscopic evidence of Barrett's                            esophagus, esophagitis, hiatal hernia or stricture                            in the entire esophagus.                           The esophagus was normal.                           The stomach was normal.                           The cardia and gastric fundus were normal on                            retroflexion.                           The examined duodenum was normal. Complications:            No immediate complications. Estimated Blood Loss:     Estimated blood loss: none. Impression:               - Normal esophagus.                           - Normal stomach.                           - Normal examined duodenum.                           -  No specimens collected.                           Suspect benign esophageal dysmotility from GERD and                            possibly side effect of Abilify. Recommendation:           - Patient has a contact number available for                            emergencies. The signs and symptoms of potential                            delayed complications were discussed with the                            patient. Return to normal activities tomorrow.                            Written discharge instructions were provided to the                            patient.                           - Resume previous diet.                           - Continue present medications.                           - See the other procedure note for documentation of                            additional recommendations.                           - Follow an antireflux regimen indefinitely. This                            is at least as important as medicines to control                            reflux  symptoms. Cut and chew food well, plenty of                            fluids when eating. Bridget [name] L. Bridget Carrow, MD 01/29/2023 10:04:31 AM This report has been signed electronically.

## 2023-01-29 NOTE — Progress Notes (Signed)
Called to room to assist during endoscopic procedure.  Patient ID and intended procedure confirmed with present staff. Received instructions for my participation in the procedure from the performing physician.  

## 2023-01-30 ENCOUNTER — Encounter
Payer: Medicare (Managed Care) | Attending: Physical Medicine & Rehabilitation | Admitting: Physical Medicine & Rehabilitation

## 2023-01-30 ENCOUNTER — Telehealth: Payer: Self-pay

## 2023-01-30 ENCOUNTER — Encounter: Payer: Self-pay | Admitting: Physical Medicine & Rehabilitation

## 2023-01-30 VITALS — HR 89 | Ht 64.0 in | Wt 228.0 lb

## 2023-01-30 DIAGNOSIS — M47816 Spondylosis without myelopathy or radiculopathy, lumbar region: Secondary | ICD-10-CM | POA: Insufficient documentation

## 2023-01-30 NOTE — Progress Notes (Signed)
Subjective:    Patient ID: Bridget [name], female    DOB: December 26, 1955, 67 y.o.   MRN: [id number]  HPI CC:  Right buttocks pain  67 yo female with hx of morbid obesity,fibromyalgia,  schizoaffective disorder and bilateral knee OA referred by PCP for above complaints.  Pt states pain has been present for ~14yr, told it was arthritis, denies injury or fall.   Has ooc numbness in the RIght big toe.  Has had right S1 transforaminal ESI in May 2023 which pt reports was not helpful for her buttocks pain.   No progressive weakness or new bowel /bladder control issues   Pain Inventory Average Pain 10 Pain Right Now 8 My pain is constant and spasms, ache  In the last 24 hours, has pain interfered with the following? General activity 9 Relation with others 3 Enjoyment of life 6 What TIME of day is your pain at its worst? morning , daytime, evening, night, and varies Sleep (in general) Poor  Pain is worse with: walking, bending, sitting, standing, and some activites Pain improves with:  nothing Relief from Meds: 0  use a cane how many minutes can you walk? 10 ability to climb steps?  yes do you drive?  yes Do you have any goals in this area?  yes  disabled: date disabled I don't know I need assistance with the following:  dressing, meal prep, household duties, and shopping Do you have any goals in this area?  yes  bowel control problems weakness tremor tingling trouble walking spasms dizziness depression anxiety  Any changes since last visit?  no  Any changes since last visit?  no    Family History  Problem Relation Age of Onset   Diabetes Mother    Prostate cancer Father    High blood pressure Sister    Colon cancer Neg Hx    Colon polyps Neg Hx    Rectal cancer Neg Hx    Stomach cancer Neg Hx    Social History   Socioeconomic History   Marital status: Married    Spouse name: Not on file   Number of children: Not on file   Years of education: Not on file    Highest education level: Not on file  Occupational History   Not on file  Tobacco Use   Smoking status: Never   Smokeless tobacco: Never  Vaping Use   Vaping Use: Never used  Substance and Sexual Activity   Alcohol use: No   Drug use: No   Sexual activity: Not Currently    Birth control/protection: Post-menopausal    Comment: Hysterectomy  Other Topics Concern   Not on file  Social History Narrative   Not on file   Social Determinants of Health   Financial Resource Strain: Not on file  Food Insecurity: Not on file  Transportation Needs: Not on file  Physical Activity: Not on file  Stress: Not on file  Social Connections: Not on file   Past Surgical History:  Procedure Laterality Date   ABDOMINAL HYSTERECTOMY     BREAST EXCISIONAL BIOPSY Right    BREAST EXCISIONAL BIOPSY Right    BREAST EXCISIONAL BIOPSY Right    BREAST EXCISIONAL BIOPSY Right    BUNIONECTOMY Bilateral    COLONOSCOPY  08/2013   HAND SURGERY Right    carpel tunnel surgery   INJECTION KNEE Bilateral 10/30/2021   Procedure: BILATERAL KNEE INJECTIONS;  Surgeon: XLeandrew Koyanagi MD;  Location: [facility]  Service:  Orthopedics;  Laterality: Bilateral;   MULTIPLE TOOTH EXTRACTIONS     full dentures   SHOULDER ARTHROSCOPY WITH ROTATOR CUFF REPAIR AND SUBACROMIAL DECOMPRESSION Right 10/30/2021   Procedure: RIGHT SHOULDER ARTHROSCOPY WITH ROTATOR CUFF REPAIR AND SUBACROMIAL DECOMPRESSION, DEBRIDEMENT;  Surgeon: Leandrew Koyanagi, MD;  Location: [facility];  Service: Orthopedics;  Laterality: Right;   TRIGGER FINGER RELEASE Bilateral    thumbs   Past Medical History:  Diagnosis Date   Anxiety    Back pain    low back - arthritis   Barrett's esophagus    Chronic headaches    Constipation    miralax daily   Depression    Dry mouth    biotene   Fibromyalgia    GERD (gastroesophageal reflux disease)    Heart murmur    Hyperlipemia    Insomnia    Myoclonus    patient states  "no longer an issue"   Osteoarthritis    lower back, shoulders, hands   Schizoaffective disorder (HCC)    SVD (spontaneous vaginal delivery)    x 3   Vertigo    Wears dentures    full   Ht 5' 4"$  (1.626 m)   BMI 38.96 kg/m   Opioid Risk Score:   Fall Risk Score:  `1  Depression screen PHQ 2/9      No data to display          Review of Systems  Gastrointestinal:  Positive for constipation.  Musculoskeletal:  Positive for back pain and gait problem.       Pain in both knees Right leg pain, right buttock pain, Pain in shoulders  Neurological:  Positive for dizziness, tremors and weakness.  Psychiatric/Behavioral:         Depression, Anxiety  All other systems reviewed and are negative.      Objective:   Physical Exam Obese female no acute distress Mood and affect: With affect flat but no evidence of lability or agitation Extremities no clubbing cyanosis or edema Motor strength is 5/5 bilateral deltoid, bicep, tricep, grip, hip flexor, knee extensor, ankle dorsiflexor and plantar flexor Negative straight leg raising bilaterally Deep tendon reflexes trace bilateral knees and ankles. Sensation intact to light touch bilateral L3-4-5 S1 dermatomal distribution Ambulates with a walker no evidence of toe drag or knee instability Left knee orthosis Lumbar spine has no evidence of scoliosis.  She has limited lumbar extension but full lumbar flexion extension has more pain than flexion. There is mild tenderness to palpation around the L5-S1 junction bilaterally Patient has thigh thrust test positive for low back pain on the left side but causing groin pain on the right side Faber's test causing hip pain on the right side and low back pain on the left side Distraction test negative There is no pain with hip internal and external rotation bilaterally.       Assessment & Plan:   1.  Chronic low back pain location is mainly at the L5-S1 junction and worse with extension than  with flexion.  This is most consistent with lumbar facet mediated pain.  X-rays of the lumbar spine reviewed revealing facet joint arthrosis bilateral L4-5 and L5-S1. Possible contribution from sacroiliac joint as well most likely on the left side. Possible right hip joint mediated pain as well. Recommend lumbar medial branch blocks bilateral L3-4-5 to address L4-5 and L5-S1 lumbar levels. Will see her back in approximately 1 month.  Goal is 80% relief on a short-term basis if  this is the case, would repeat x 1 and then proceed to radiofrequency neurotomy.

## 2023-01-30 NOTE — Patient Instructions (Signed)
Plan on doing injections to help with pain originating from lumbar facet injections

## 2023-01-30 NOTE — Telephone Encounter (Signed)
  Follow up Call-     01/29/2023    8:49 AM  Call back number  Post procedure Call Back phone  # [phone]  Permission to leave phone message Yes    Post op call attempted, no answer, full WM, unable to leave msg.

## 2023-02-05 ENCOUNTER — Encounter: Payer: Self-pay | Admitting: Gastroenterology

## 2023-02-17 ENCOUNTER — Encounter: Payer: Self-pay | Admitting: Orthopaedic Surgery

## 2023-02-17 ENCOUNTER — Ambulatory Visit (INDEPENDENT_AMBULATORY_CARE_PROVIDER_SITE_OTHER): Payer: Medicare (Managed Care)

## 2023-02-17 ENCOUNTER — Ambulatory Visit: Payer: Medicare (Managed Care) | Admitting: Orthopaedic Surgery

## 2023-02-17 VITALS — Ht 63.0 in | Wt 222.2 lb

## 2023-02-17 DIAGNOSIS — M1711 Unilateral primary osteoarthritis, right knee: Secondary | ICD-10-CM | POA: Diagnosis not present

## 2023-02-17 DIAGNOSIS — M1712 Unilateral primary osteoarthritis, left knee: Secondary | ICD-10-CM | POA: Diagnosis not present

## 2023-02-17 NOTE — Progress Notes (Unsigned)
Office Visit Note   Patient: Bridget [name]           Date of Birth: [birth year]           MRN: [id number] Visit Date: 02/17/2023              Requested by: Janifer Adie, MD No address on file PCP: Janifer Adie, MD (Inactive)   Assessment & Plan: Visit Diagnoses:  1. Primary osteoarthritis of left knee   2. Primary osteoarthritis of right knee     Plan: Impression is severe left knee degenerative joint disease secondary to Osteoarthritis.  Bone on bone joint space narrowing is seen on radiographs with neutral alignment.  At this point, conservative treatments fail to provide any significant relief and the pain is severely affecting ADLs and quality of life.  Based on treatment options, the patient has elected to move forward with a knee replacement.  We have discussed the surgical risks that include but are not limited to infection, DVT, leg length discrepancy, stiffness, numbness, tingling, incomplete relief of pain.  Recovery and prognosis were also reviewed.    In regards to the right knee osteoarthritis this was injected with steroid today.  Follow-up as needed for this.  Anticoagulants: No antithrombotic Postop anticoagulation: Aspirin 81 mg Diabetic: No  Nickel allergy: No Prior DVT/PE: No Tobacco use: No Clearances needed for surgery: [facility]ticipated [facility] dispo: [facility]   Follow-Up Instructions: No follow-ups on file.   Orders:  Orders Placed This Encounter  Procedures   Large Joint Inj: R knee   XR Knee 1-2 Views Left   No orders of the defined types were placed in this encounter.     Procedures: Large Joint Inj: R knee on 02/17/2023 2:10 PM Indications: pain Details: 22 G needle  Arthrogram: No  Medications: 40 mg methylPREDNISolone acetate 40 MG/ML; 2 mL lidocaine 1 %; 2 mL bupivacaine 0.5 % Consent was given by the patient. Patient was prepped and draped in the usual sterile fashion.       Clinical Data: No additional  findings.   Subjective: Chief Complaint  Patient presents with   Right Knee - Pain   Left Knee - Pain    HPI  Review of Systems  Constitutional: Negative.   HENT: Negative.    Eyes: Negative.   Respiratory: Negative.    Cardiovascular: Negative.   Endocrine: Negative.   Musculoskeletal: Negative.   Neurological: Negative.   Hematological: Negative.   Psychiatric/Behavioral: Negative.    All other systems reviewed and are negative.    Objective: Vital Signs: Ht '5\' 3"'$  (1.6 m)   Wt 222 lb 3.2 oz (100.8 kg)   BMI 39.36 kg/m   Physical Exam Vitals and nursing note reviewed.  Constitutional:      Appearance: She is well-developed.  HENT:     Head: Normocephalic and atraumatic.  Pulmonary:     Effort: Pulmonary effort is normal.  Abdominal:     Palpations: Abdomen is soft.  Musculoskeletal:     Cervical back: Neck supple.  Skin:    General: Skin is warm.     Capillary Refill: Capillary refill takes less than 2 seconds.  Neurological:     Mental Status: She is alert and oriented to person, place, and time.  Psychiatric:        Behavior: Behavior normal.        Thought Content: Thought content normal.        Judgment: Judgment normal.  Ortho Exam  Specialty Comments:  No specialty comments available.  Imaging: No results found.   PMFS History: Patient Active Problem List   Diagnosis Date Noted   Dysphagia 11/02/2022   Primary osteoarthritis of right knee 01/28/2022   Primary osteoarthritis of left knee 01/28/2022   Complete tear of right rotator cuff 10/30/2021   GAD (generalized anxiety disorder) 01/24/2020   Schizoaffective disorder, bipolar type ([facility]) 02/04/2018   Past Medical History:  Diagnosis Date   Anxiety    Back pain    low back - arthritis   Barrett's esophagus    Chronic headaches    Constipation    miralax daily   Depression    Dry mouth    biotene   Fibromyalgia    GERD (gastroesophageal reflux disease)    Heart murmur     Hyperlipemia    Insomnia    Myoclonus    patient states "no longer an issue"   Osteoarthritis    lower back, shoulders, hands   Schizoaffective disorder (HCC)    SVD (spontaneous vaginal delivery)    x 3   Vertigo    Wears dentures    full    Family History  Problem Relation Age of Onset   Diabetes Mother    Prostate cancer Father    High blood pressure Sister    Colon cancer Neg Hx    Colon polyps Neg Hx    Rectal cancer Neg Hx    Stomach cancer Neg Hx     Past Surgical History:  Procedure Laterality Date   ABDOMINAL HYSTERECTOMY     BREAST EXCISIONAL BIOPSY Right    BREAST EXCISIONAL BIOPSY Right    BREAST EXCISIONAL BIOPSY Right    BREAST EXCISIONAL BIOPSY Right    BUNIONECTOMY Bilateral    COLONOSCOPY  08/2013   HAND SURGERY Right    carpel tunnel surgery   INJECTION KNEE Bilateral 10/30/2021   Procedure: BILATERAL KNEE INJECTIONS;  Surgeon: Leandrew Koyanagi, MD;  Location: [facility];  Service: Orthopedics;  Laterality: Bilateral;   MULTIPLE TOOTH EXTRACTIONS     full dentures   SHOULDER ARTHROSCOPY WITH ROTATOR CUFF REPAIR AND SUBACROMIAL DECOMPRESSION Right 10/30/2021   Procedure: RIGHT SHOULDER ARTHROSCOPY WITH ROTATOR CUFF REPAIR AND SUBACROMIAL DECOMPRESSION, DEBRIDEMENT;  Surgeon: Leandrew Koyanagi, MD;  Location: [facility];  Service: Orthopedics;  Laterality: Right;   TRIGGER FINGER RELEASE Bilateral    thumbs   Social History   Occupational History   Not on file  Tobacco Use   Smoking status: Never   Smokeless tobacco: Never  Vaping Use   Vaping Use: Never used  Substance and Sexual Activity   Alcohol use: No   Drug use: No   Sexual activity: Not Currently    Birth control/protection: Post-menopausal    Comment: Hysterectomy

## 2023-02-18 MED ORDER — METHYLPREDNISOLONE ACETATE 40 MG/ML IJ SUSP
40.0000 mg | INTRAMUSCULAR | Status: AC | PRN
  Administered 2023-02-17: 40 mg via INTRA_ARTICULAR

## 2023-02-18 MED ORDER — BUPIVACAINE HCL 0.5 % IJ SOLN
2.0000 mL | INTRAMUSCULAR | Status: AC | PRN
  Administered 2023-02-17: 2 mL via INTRA_ARTICULAR

## 2023-02-18 MED ORDER — LIDOCAINE HCL 1 % IJ SOLN
2.0000 mL | INTRAMUSCULAR | Status: AC | PRN
  Administered 2023-02-17: 2 mL

## 2023-03-03 ENCOUNTER — Encounter: Payer: Self-pay | Admitting: Physical Medicine & Rehabilitation

## 2023-03-03 ENCOUNTER — Encounter
Payer: Medicare (Managed Care) | Attending: Physical Medicine & Rehabilitation | Admitting: Physical Medicine & Rehabilitation

## 2023-03-03 VITALS — BP 103/71 | HR 95 | Ht 63.0 in | Wt 222.0 lb

## 2023-03-03 DIAGNOSIS — M47816 Spondylosis without myelopathy or radiculopathy, lumbar region: Secondary | ICD-10-CM | POA: Insufficient documentation

## 2023-03-03 MED ORDER — LIDOCAINE HCL (PF) 2 % IJ SOLN
4.0000 mL | Freq: Once | INTRAMUSCULAR | Status: AC
  Administered 2023-03-03: 4 mL

## 2023-03-03 MED ORDER — LIDOCAINE HCL 1 % IJ SOLN
10.0000 mL | Freq: Once | INTRAMUSCULAR | Status: AC
  Administered 2023-03-03: 10 mL

## 2023-03-03 MED ORDER — IOHEXOL 180 MG/ML  SOLN
3.0000 mL | Freq: Once | INTRAMUSCULAR | Status: AC
  Administered 2023-03-03: 3 mL via INTRAVENOUS

## 2023-03-03 NOTE — Patient Instructions (Signed)

## 2023-03-03 NOTE — Progress Notes (Signed)
Bilateral Lumbar L3, L4  medial branch blocks and L 5 dorsal ramus injection under fluoroscopic guidance  Indication: Lumbar pain which is not relieved by medication management or other conservative care and interfering with self-care and mobility.  Informed consent was obtained after describing risks and benefits of the procedure with the patient, this includes bleeding, infection, paralysis and medication side effects.  The patient wishes to proceed and has given written consent.  The patient was placed in prone position.  The lumbar area was marked and prepped with Betadine.  One mL of 1% lidocaine was injected into each of 6 areas into the skin and subcutaneous tissue.  Then a 22-gauge 5" spinal needle was inserted targeting the junction of the left S1 superior articular process and sacral ala junction. Needle was advanced under fluoroscopic guidance.  Bone contact was made.Omnipaque 180 was injected x 0.5 mL demonstrating no intravascular uptake.  Then a solution  of 2% MPF lidocaine was injected x 0.5 mL.  Then the left L5 superior articular process in transverse process junction was targeted.  Bone contact was made. Omnipaque 180 was injected x 0.5 mL demonstrating no intravascular uptake. Then a solution containing  2% MPF lidocaine was injected x 0.5 mL.  Then the left L4 superior articular process in transverse process junction was targeted.  Bone contact was made. Omnipaque 180 was injected x 0.5 mL demonstrating no intravascular uptake.  Then a solution containing2% MPF lidocaine was injected x 0.5 mL.  This same procedure was performed on the right side using the same needle, technique and injectate.  Patient tolerated procedure well.  Post procedure instructions were given.   Lidocaine 1% with preservative multidose, 10ml no waste Lidocaine 2% MPF, 5ml bottle, 3ml used 2ml waste Omnipaque 180 1.5 ml used, 8.5 ml waste  

## 2023-03-03 NOTE — Progress Notes (Signed)
  PROCEDURE RECORD [facility]   Name: JENNYE BLEYER DOB:06-26-1956 MRN: [id number]  Date:03/03/2023  Physician: Alysia Penna, MD    Nurse/CMA: Truman Hayward, CMA  Allergies:  Allergies  Allergen Reactions   Flexeril [Cyclobenzaprine] Other (See Comments)    Urinary retention   Amitriptyline Other (See Comments)    Urinary retention   Penicillins Rash    Has patient had a PCN reaction causing immediate rash, facial/tongue/throat swelling, SOB or lightheadedness with hypotension: yes Has patient had a PCN reaction causing severe rash involving mucus membranes or skin necrosis: no Has patient had a PCN reaction that required hospitalization: No Has patient had a PCN reaction occurring within the last 10 years: No If all of the above answers are "NO", then may proceed with Cephalosporin use.     Consent Signed: Yes.    Is patient diabetic? No.  CBG today? .  Pregnant: No. LMP: No LMP recorded. Patient has had a hysterectomy. (age [age])  Anticoagulants: no Anti-inflammatory: yes (Ibuprofen 800 mg, prednisone dosepak-last one sunday) Antibiotics: no  Procedure: Bilateral L3-4-5 Medial Branch Block  Position: Prone Start Time: 12:57 pm  End Time: 1:21 pm  Fluoro Time: 52  RN/CMA Truman Hayward, CMA Quamere Mussell, CMA    Time 12:48pm 1:31 pm    BP 103/71 117/77    Pulse 95 90    Respirations 16 16    O2 Sat 98 97    S/S 6 6    Pain Level 5/10 4/10     D/C home with no one, patient A & O X 3, D/C instructions reviewed, and sits independently.

## 2023-03-04 ENCOUNTER — Encounter: Payer: Self-pay | Admitting: Pulmonary Disease

## 2023-03-04 ENCOUNTER — Ambulatory Visit: Payer: Medicare (Managed Care) | Admitting: Pulmonary Disease

## 2023-03-04 VITALS — BP 112/74 | HR 91 | Ht 64.0 in | Wt 217.0 lb

## 2023-03-04 DIAGNOSIS — F5104 Psychophysiologic insomnia: Secondary | ICD-10-CM

## 2023-03-04 DIAGNOSIS — F250 Schizoaffective disorder, bipolar type: Secondary | ICD-10-CM | POA: Diagnosis not present

## 2023-03-04 MED ORDER — DOXEPIN HCL 6 MG PO TABS: 6.0000 mg | ORAL_TABLET | Freq: Every evening | ORAL | 1 refills | Status: DC

## 2023-03-04 NOTE — Progress Notes (Signed)
Bridget [name]    [id number]    01-Feb-1956  Primary Care Physician:Koehler, Angelyn Punt, MD  Referring Physician: No referring provider defined for this encounter.  Chief complaint:   Patient being seen today for chronic insomnia.    HPI: No changes in health since last time she was here  She states Dwana Curd is no longer helping  Difficulty falling asleep, difficulty staying asleep  She felt it worked a little bit earlier and now no longer working  Tries to go to bed at about 9 PM, might take about 2 to 3 hours to fall asleep Wakes up frequently She gets out of bed finally about 10 AM but she says she just stays in bed not because she is sleeping through the night  Sleeps better but still feels tired during the day  Previously noted-she did bring again a 2-week sleep diary that reflects she is only been sleeping about 2 to 3 hours of noncontinuous sleep  Occasionally does get about 4 hours of noncontinuous sleep  She does have multiple joint pains, neck pain back pain, knee pain States she does not sleep at all  Brother with sleep issues as well  Not sleepy during the day No dryness of the mouth in the mornings  History of snoring, no history of concern for sleep apnea  Has tried multiple medications in the past that has not helped with sleep and Did use trazodone in the past, get  She does have a history of schizoaffective disorder-bipolar type, generalized anxiety disorder  Outpatient Encounter Medications as of 03/04/2023  Medication Sig   ARIPiprazole (ABILIFY) 10 MG tablet Take 10 mg by mouth daily.   celecoxib (CELEBREX) 200 MG capsule Take 200 mg by mouth daily.   Daridorexant HCl 50 MG TABS Take 50 mg by mouth at bedtime.   dicyclomine (BENTYL) 20 MG tablet Take 1 tablet (20 mg total) by mouth 2 (two) times daily.   DULoxetine (CYMBALTA) 60 MG capsule Take 60 mg by mouth 2 (two) times daily.   esomeprazole (NEXIUM) 40 MG capsule Take 40 mg by  mouth daily at 12 noon.   hydrOXYzine (VISTARIL) 25 MG capsule Take 1 capsule (25 mg total) by mouth 3 (three) times daily as needed for anxiety.   ibuprofen (ADVIL) 800 MG tablet Take 800 mg by mouth every 8 (eight) hours as needed.   LORazepam (ATIVAN) 0.5 MG tablet Take 0.5 mg by mouth 2 (two) times daily.   methocarbamol (ROBAXIN) 500 MG tablet Take 1 tablet (500 mg total) by mouth 2 (two) times daily as needed.   polyethylene glycol (MIRALAX / GLYCOLAX) packet Take 17 g by mouth daily as needed.   risperiDONE (RISPERDAL) 1 MG tablet Take 1-2 mg by mouth See admin instructions. 1 MG at night for agitation and 2 MG at bedtime for sleep   rosuvastatin (CRESTOR) 20 MG tablet Take 20 mg by mouth daily.   sodium chloride (MURO 128) 5 % ophthalmic ointment Place 1 application into both eyes 4 (four) times daily as needed for eye irritation.   traMADol (ULTRAM) 50 MG tablet Take 1-2 tablets (50-100 mg total) by mouth daily as needed.   No facility-administered encounter medications on file as of 03/04/2023.    Allergies as of 03/04/2023 - Review Complete 03/04/2023  Allergen Reaction Noted   Flexeril [cyclobenzaprine] Other (See Comments) 09/22/2017   Amitriptyline Other (See Comments) 09/22/2017   Penicillins Rash 09/22/2017    Past  Medical History:  Diagnosis Date   Anxiety    Back pain    low back - arthritis   Barrett's esophagus    Chronic headaches    Constipation    miralax daily   Depression    Dry mouth    biotene   Fibromyalgia    GERD (gastroesophageal reflux disease)    Heart murmur    Hyperlipemia    Insomnia    Myoclonus    patient states "no longer an issue"   Osteoarthritis    lower back, shoulders, hands   Schizoaffective disorder (HCC)    SVD (spontaneous vaginal delivery)    x 3   Vertigo    Wears dentures    full    Past Surgical History:  Procedure Laterality Date   ABDOMINAL HYSTERECTOMY     BREAST EXCISIONAL BIOPSY Right    BREAST EXCISIONAL  BIOPSY Right    BREAST EXCISIONAL BIOPSY Right    BREAST EXCISIONAL BIOPSY Right    BUNIONECTOMY Bilateral    COLONOSCOPY  08/2013   HAND SURGERY Right    carpel tunnel surgery   INJECTION KNEE Bilateral 10/30/2021   Procedure: BILATERAL KNEE INJECTIONS;  Surgeon: Leandrew Koyanagi, MD;  Location: [facility];  Service: Orthopedics;  Laterality: Bilateral;   MULTIPLE TOOTH EXTRACTIONS     full dentures   SHOULDER ARTHROSCOPY WITH ROTATOR CUFF REPAIR AND SUBACROMIAL DECOMPRESSION Right 10/30/2021   Procedure: RIGHT SHOULDER ARTHROSCOPY WITH ROTATOR CUFF REPAIR AND SUBACROMIAL DECOMPRESSION, DEBRIDEMENT;  Surgeon: Leandrew Koyanagi, MD;  Location: [facility];  Service: Orthopedics;  Laterality: Right;   TRIGGER FINGER RELEASE Bilateral    thumbs    Family History  Problem Relation Age of Onset   Diabetes Mother    Prostate cancer Father    High blood pressure Sister    Colon cancer Neg Hx    Colon polyps Neg Hx    Rectal cancer Neg Hx    Stomach cancer Neg Hx     Social History   Socioeconomic History   Marital status: Married    Spouse name: Not on file   Number of children: Not on file   Years of education: Not on file   Highest education level: Not on file  Occupational History   Not on file  Tobacco Use   Smoking status: Never   Smokeless tobacco: Never  Vaping Use   Vaping Use: Never used  Substance and Sexual Activity   Alcohol use: No   Drug use: No   Sexual activity: Not Currently    Birth control/protection: Post-menopausal    Comment: Hysterectomy  Other Topics Concern   Not on file  Social History Narrative   Not on file   Social Determinants of Health   Financial Resource Strain: Not on file  Food Insecurity: Not on file  Transportation Needs: Not on file  Physical Activity: Not on file  Stress: Not on file  Social Connections: Not on file  Intimate Partner Violence: Not on file    Review of Systems   Psychiatric/Behavioral:  Positive for sleep disturbance.   All other systems reviewed and are negative.   Vitals:   03/04/23 1118  BP: 112/74  Pulse: 91  SpO2: 100%     Physical Exam Constitutional:      Appearance: She is obese.  HENT:     Head: Normocephalic and atraumatic.     Mouth/Throat:     Mouth: Mucous membranes are moist.  Comments: Mallampati 3, crowded oropharynx Eyes:     Extraocular Movements: Extraocular movements intact.     Pupils: Pupils are equal, round, and reactive to light.  Cardiovascular:     Rate and Rhythm: Normal rate and regular rhythm.     Heart sounds: No murmur heard.    No friction rub.  Pulmonary:     Effort: No respiratory distress.     Breath sounds: No stridor. No wheezing or rhonchi.  Musculoskeletal:     Cervical back: No rigidity or tenderness.  Neurological:     Mental Status: She is alert.  Psychiatric:        Mood and Affect: Mood normal.    Assessment:  Chronic insomnia  -Has used multiple agents in the past has recently been on Quviviq -She has recently been on Quviviq -Feels Dwana Curd is no longer working  Insomnia related to medical condition  History of bipolar disorder  Plan/Recommendations: Will discontinue [id number]  [facility], doxepin at 6 mg will be appropriate  Encourage graded exercise as tolerated  Early follow-up is warranted  Will follow-up in about 4 weeks  Encouraged to give [place] a call with significant concerns   Sherrilyn Rist MD  Pulmonary and Critical Care 03/04/2023, 12:14 PM  CC: No ref. provider found

## 2023-03-04 NOTE — Patient Instructions (Signed)
Stop using daridorexant since you say it it is not helping any longer  Prescription for doxepin will be sent to pharmacy for you  I will see you back in about 4 weeks  If the new medication is not as effective as what we are stopping, Best case scenario is to have you on a medication that still helps, we may have to go back to one of the other medications that you have tried in the past . It is not advisable to combine multiple sleep aids as the effect adds up  Call us with significant concerns

## 2023-03-05 ENCOUNTER — Ambulatory Visit: Payer: Medicare (Managed Care) | Admitting: Student in an Organized Health Care Education/Training Program

## 2023-03-25 ENCOUNTER — Telehealth: Payer: Self-pay | Admitting: Orthopaedic Surgery

## 2023-03-25 NOTE — Telephone Encounter (Signed)
Spoke with Shanda Bumps at [facility] [phone] regarding authorization for left total knee replacement 04-20-23.  The provider has been out of the office for 2 weeks and is back today.  For this reason we have not received ok for surgery.  Shanda Bumps will have doctor sign clearance form and return.  Patient is aware of location, surgery date and time.

## 2023-03-27 LAB — LAB REPORT - SCANNED
A1c: 5.7
EGFR: 71

## 2023-03-31 ENCOUNTER — Encounter
Payer: Medicare (Managed Care) | Attending: Physical Medicine & Rehabilitation | Admitting: Physical Medicine & Rehabilitation

## 2023-03-31 ENCOUNTER — Encounter: Payer: Self-pay | Admitting: Physical Medicine & Rehabilitation

## 2023-03-31 VITALS — BP 116/78 | HR 102 | Resp 95 | Ht 64.0 in | Wt 225.0 lb

## 2023-03-31 DIAGNOSIS — G8929 Other chronic pain: Secondary | ICD-10-CM

## 2023-03-31 DIAGNOSIS — M545 Low back pain, unspecified: Secondary | ICD-10-CM | POA: Diagnosis present

## 2023-03-31 NOTE — Progress Notes (Signed)
Subjective:  67 yo female with hx of morbid obesity,fibromyalgia,  schizoaffective disorder and bilateral knee OA referred by PCP for above complaints.  Pt states pain has been present for ~48yrs, told it was arthritis, denies injury or fall.   Has occ numbness in the RIght big toe.  Has had right S1 transforaminal ESI in May 2023 which pt reports was not helpful for her buttocks pain.    Patient ID: Bridget [name], female    DOB: [birth year], 67 y.o.   MRN: [id number]  HPI 67 yo female with hx of chronic low back pain. Has been to PT ~72mo ago for back and knee pain without much relief  Has been taking narcotic analgesic (tramadol) and NSAID (celebrex) with only partial relief.  Pain rated as severe.   03/03/23 Bilateral Lumbar L3, L4  medial branch blocks and L 5 dorsal ramus injection under fluoroscopic guidance Pre injection 5/10 post injection 4/10  [facility]  Mod I dressing and bathing , sometimes needs help with bathing , uses shower but not using her shower chair   No falls at home, her daughter is with her at home  Pain Inventory Average Pain 10 Pain Right Now 8 My pain is burning, dull, stabbing, tingling, and aching  In the last 24 hours, has pain interfered with the following? General activity 10 Relation with others 10 Enjoyment of life 10 What TIME of day is your pain at its worst? morning , daytime, evening, and night Sleep (in general) Poor  Pain is worse with: walking, bending, sitting, inactivity, standing, and some activites Pain improves with: rest Relief from Meds: 0  Family History  Problem Relation Age of Onset   Diabetes Mother    Prostate cancer Father    High blood pressure Sister    Colon cancer Neg Hx    Colon polyps Neg Hx    Rectal cancer Neg Hx    Stomach cancer Neg Hx    Social History   Socioeconomic History   Marital status: Married    Spouse name: Not on file   Number of children: Not on file   Years  of education: Not on file   Highest education level: Not on file  Occupational History   Not on file  Tobacco Use   Smoking status: Never   Smokeless tobacco: Never  Vaping Use   Vaping Use: Never used  Substance and Sexual Activity   Alcohol use: No   Drug use: No   Sexual activity: Not Currently    Birth control/protection: Post-menopausal    Comment: Hysterectomy  Other Topics Concern   Not on file  Social History Narrative   Not on file   Social Determinants of Health   Financial Resource Strain: Not on file  Food Insecurity: Not on file  Transportation Needs: Not on file  Physical Activity: Not on file  Stress: Not on file  Social Connections: Not on file   Past Surgical History:  Procedure Laterality Date   ABDOMINAL HYSTERECTOMY     BREAST EXCISIONAL BIOPSY Right    BREAST EXCISIONAL BIOPSY Right    BREAST EXCISIONAL BIOPSY Right    BREAST EXCISIONAL BIOPSY Right    BUNIONECTOMY Bilateral    COLONOSCOPY  08/2013   HAND SURGERY Right    carpel tunnel surgery   INJECTION KNEE Bilateral 10/30/2021   Procedure: BILATERAL KNEE INJECTIONS;  Surgeon: Tarry Kos, MD;  Location: [facility];  Service: Orthopedics;  Laterality: Bilateral;   MULTIPLE TOOTH EXTRACTIONS     full dentures   SHOULDER ARTHROSCOPY WITH ROTATOR CUFF REPAIR AND SUBACROMIAL DECOMPRESSION Right 10/30/2021   Procedure: RIGHT SHOULDER ARTHROSCOPY WITH ROTATOR CUFF REPAIR AND SUBACROMIAL DECOMPRESSION, DEBRIDEMENT;  Surgeon: Tarry Kos, MD;  Location: [facility];  Service: Orthopedics;  Laterality: Right;   TRIGGER FINGER RELEASE Bilateral    thumbs   Past Surgical History:  Procedure Laterality Date   ABDOMINAL HYSTERECTOMY     BREAST EXCISIONAL BIOPSY Right    BREAST EXCISIONAL BIOPSY Right    BREAST EXCISIONAL BIOPSY Right    BREAST EXCISIONAL BIOPSY Right    BUNIONECTOMY Bilateral    COLONOSCOPY  08/2013   HAND SURGERY Right    carpel tunnel surgery    INJECTION KNEE Bilateral 10/30/2021   Procedure: BILATERAL KNEE INJECTIONS;  Surgeon: Tarry Kos, MD;  Location: [facility];  Service: Orthopedics;  Laterality: Bilateral;   MULTIPLE TOOTH EXTRACTIONS     full dentures   SHOULDER ARTHROSCOPY WITH ROTATOR CUFF REPAIR AND SUBACROMIAL DECOMPRESSION Right 10/30/2021   Procedure: RIGHT SHOULDER ARTHROSCOPY WITH ROTATOR CUFF REPAIR AND SUBACROMIAL DECOMPRESSION, DEBRIDEMENT;  Surgeon: Tarry Kos, MD;  Location: [facility];  Service: Orthopedics;  Laterality: Right;   TRIGGER FINGER RELEASE Bilateral    thumbs   Past Medical History:  Diagnosis Date   Anxiety    Back pain    low back - arthritis   Barrett's esophagus    Chronic headaches    Constipation    miralax daily   Depression    Dry mouth    biotene   Fibromyalgia    GERD (gastroesophageal reflux disease)    Heart murmur    Hyperlipemia    Insomnia    Myoclonus    patient states "no longer an issue"   Osteoarthritis    lower back, shoulders, hands   Schizoaffective disorder (HCC)    SVD (spontaneous vaginal delivery)    x 3   Vertigo    Wears dentures    full   BP 116/78   Pulse (!) 102   Resp (!) 95   Ht  (1.626 m)   Wt 225 lb (102.1 kg)   BMI 38.62 kg/m   Opioid Risk Score:   Fall Risk Score:  `1  Depression screen [facility] 2/9     01/30/2023   12:44 PM  Depression screen PHQ 2/9  Decreased Interest 2  Down, Depressed, Hopeless 1  PHQ - 2 Score 3  Altered sleeping 3  Tired, decreased energy 1  Change in appetite 0  Feeling bad or failure about yourself  0  Trouble concentrating 2  Moving slowly or fidgety/restless 1  Suicidal thoughts 0  PHQ-9 Score 10     Review of Systems  Musculoskeletal:  Positive for back pain.       B/L hand, shoulder blade, thigh pain Lt buttock pain Rt leg pain b/L knee pain  All other systems reviewed and are negative.      Objective:   Physical Exam  Gen NAD Mood affect flat,  response time is prolonged  Lumbar spine without tenderness to palpation , limited lumbar flexion >extension   Sacral thrust (prone) :+ bilateral   FABER's: + SI and Left groin Distraction (supine): - Thigh thrust test: + Bilateral SI  Neg SLR  LE strength normal Sensation in LE nl Amb without AD no toe drag of knee instability  Assessment & Plan:    Chronic low back pain only partially responsive to PT, NSAID, narcotic analgesics and still rated as severe .  No sig relief with Lumbar MBB B L3-4-5 3/5 SI provocative tests positive to pain in SI area , will schedule for Bilateral SI injections diagnostic/therapeutic

## 2023-03-31 NOTE — Patient Instructions (Signed)

## 2023-04-02 ENCOUNTER — Ambulatory Visit: Payer: Medicare (Managed Care) | Admitting: Pulmonary Disease

## 2023-04-03 ENCOUNTER — Emergency Department (HOSPITAL_COMMUNITY)
Admission: EM | Admit: 2023-04-03 | Discharge: 2023-04-08 | Disposition: A | Discharge status: Home / Self Care | Payer: Medicare (Managed Care) | Attending: Emergency Medicine | Admitting: Emergency Medicine

## 2023-04-03 ENCOUNTER — Emergency Department (HOSPITAL_COMMUNITY): Payer: Medicare (Managed Care)

## 2023-04-03 DIAGNOSIS — Z0283 Encounter for blood-alcohol and blood-drug test: Secondary | ICD-10-CM | POA: Insufficient documentation

## 2023-04-03 DIAGNOSIS — F250 Schizoaffective disorder, bipolar type: Secondary | ICD-10-CM | POA: Diagnosis present

## 2023-04-03 DIAGNOSIS — F411 Generalized anxiety disorder: Secondary | ICD-10-CM | POA: Diagnosis not present

## 2023-04-03 DIAGNOSIS — F22 Delusional disorders: Secondary | ICD-10-CM | POA: Insufficient documentation

## 2023-04-03 DIAGNOSIS — Z1152 Encounter for screening for COVID-19: Secondary | ICD-10-CM | POA: Diagnosis not present

## 2023-04-03 DIAGNOSIS — R000 Tachycardia, unspecified: Secondary | ICD-10-CM | POA: Diagnosis not present

## 2023-04-03 LAB — BASIC METABOLIC PANEL
Anion gap: 8 (ref 5–15)
BUN: 12 mg/dL (ref 8–23)
CO2: 27 mmol/L (ref 22–32)
Calcium: 8.9 mg/dL (ref 8.9–10.3)
Chloride: 106 mmol/L (ref 98–111)
Creatinine, Ser: 0.9 mg/dL (ref 0.44–1.00)
GFR, Estimated: >60 mL/min (ref >60)
Glucose, Bld: 94 mg/dL (ref 70–99)
Potassium: 3.8 mmol/L (ref 3.5–5.1)
Sodium: 141 mmol/L (ref 135–145)

## 2023-04-03 LAB — CBC WITH DIFFERENTIAL/PLATELET
Abs Immature Granulocytes: 0.02 10*3/uL (ref 0.00–0.07)
Basophils Absolute: 0 10*3/uL (ref 0.0–0.1)
Basophils Relative: 0 %
Eosinophils Absolute: 0 10*3/uL (ref 0.0–0.5)
Eosinophils Relative: 0 %
HCT: 32.7 % — ABNORMAL LOW (ref 36.0–46.0)
Hemoglobin: 10.4 g/dL — ABNORMAL LOW (ref 12.0–15.0)
Immature Granulocytes: 0 %
Lymphocytes Relative: 23 %
Lymphs Abs: 1.6 10*3/uL (ref 0.7–4.0)
MCH: 29.8 pg (ref 26.0–34.0)
MCHC: 31.8 g/dL (ref 30.0–36.0)
MCV: 93.7 fL (ref 80.0–100.0)
Monocytes Absolute: 0.7 10*3/uL (ref 0.1–1.0)
Monocytes Relative: 10 %
Neutro Abs: 4.4 10*3/uL (ref 1.7–7.7)
Neutrophils Relative %: 67 %
Platelets: 293 10*3/uL (ref 150–400)
RBC: 3.49 MIL/uL — ABNORMAL LOW (ref 3.87–5.11)
RDW: 14.6 % (ref 11.5–15.5)
WBC: 6.7 10*3/uL (ref 4.0–10.5)
nRBC: 0 % (ref 0.0–0.2)

## 2023-04-03 LAB — SARS CORONAVIRUS 2 BY RT PCR: SARS Coronavirus 2 by RT PCR: NEGATIVE

## 2023-04-03 MED ORDER — RISPERIDONE 1 MG PO TABS
1.0000 mg | ORAL_TABLET | Freq: Every day | ORAL | Status: DC
  Administered 2023-04-03: 2 mg via ORAL
  Administered 2023-04-04 – 2023-04-05 (×2): 1 mg via ORAL
  Filled 2023-04-03 (×2): qty 1
  Filled 2023-04-03: qty 2

## 2023-04-03 MED ORDER — HYDROXYZINE HCL 25 MG PO TABS
25.0000 mg | ORAL_TABLET | Freq: Three times a day (TID) | ORAL | Status: DC | PRN
  Administered 2023-04-03 – 2023-04-04 (×2): 25 mg via ORAL
  Filled 2023-04-03 (×2): qty 1

## 2023-04-03 MED ORDER — PANTOPRAZOLE SODIUM 40 MG PO TBEC
40.0000 mg | DELAYED_RELEASE_TABLET | Freq: Every day | ORAL | Status: DC
  Administered 2023-04-03 – 2023-04-08 (×6): 40 mg via ORAL
  Filled 2023-04-03 (×6): qty 1

## 2023-04-03 MED ORDER — RISPERIDONE 1 MG PO TABS: 1.0000 mg | ORAL_TABLET | ORAL | Status: DC

## 2023-04-03 MED ORDER — ROSUVASTATIN CALCIUM 20 MG PO TABS
20.0000 mg | ORAL_TABLET | Freq: Every day | ORAL | Status: DC
  Administered 2023-04-03 – 2023-04-08 (×6): 20 mg via ORAL
  Filled 2023-04-03 (×6): qty 1

## 2023-04-03 MED ORDER — LORAZEPAM 0.5 MG PO TABS
0.5000 mg | ORAL_TABLET | Freq: Two times a day (BID) | ORAL | Status: DC
  Administered 2023-04-03 – 2023-04-08 (×10): 0.5 mg via ORAL
  Filled 2023-04-03 (×10): qty 1

## 2023-04-03 NOTE — ED Provider Notes (Signed)
Lab work with no significant findings.  She is medically cleared at this time.  She has no infectious symptoms.  No concern for pneumonia.  Vital signs are unremarkable.  Some home medications have been restarted.  She is awaiting psychiatric evaluation.   Virgina Norfolk, DO 04/03/23 1824

## 2023-04-03 NOTE — ED Provider Notes (Signed)
[place] EMERGENCY DEPARTMENT AT [facility] Provider Note   CSN: [id number] Arrival date & time: 04/03/23  1459     History  Chief Complaint  Patient presents with   Psychiatric Evaluation    Bridget [name] is a 67 y.o. female.  Patient is a 67 year old female with a history of bipolar, schizoaffective disorder, GERD presenting today from her physician's office [facility] due to erratic behavior, delusions and paranoia.  Patient reports she is here because she is delusional but does not know what that means.  She reports that she is hearing voices but they tell her to bathe herself to clean her house but never tell her to hurt herself or other people.  She does think there are multiple people who want to hurt her but she does not know who they are.  Also reports at times there are people who want to hurt her family.  Speaking with her daughter [name] who reports that recently patient has not been taking her medications she has been occasionally taking them erratically.  She has started to become very paranoid and this week had taken all the close out of the house and put them in her car telling her family members they had to leave now because someone was coming to get them.  When they tried to stop her she did become slightly aggressive but has not otherwise tried to hurt anybody.  She has not had any suicidal ideation which patient denies today as well.  She recently did have some change in her medication for sleeping but daughter is not sure what that is.  Otherwise no other medication changes.  Because her doctor was concerned for her safety they called police today to bring her to the hospital.  Patient is currently voluntary.  The history is provided by the patient, a relative and the police.       Home Medications Prior to Admission medications   Medication Sig Start Date End Date Taking? Authorizing Provider  ARIPiprazole (ABILIFY) 10 MG tablet Take 10 mg  by mouth daily.    [provider]  celecoxib (CELEBREX) 200 MG capsule Take 200 mg by mouth daily.    [provider]  dicyclomine (BENTYL) 20 MG tablet Take 1 tablet (20 mg total) by mouth 2 (two) times daily. 11/06/22   Charlynne Pander, MD  Doxepin HCl 6 MG TABS Take 1 tablet (6 mg total) by mouth at bedtime. 03/04/23   Olalere, Minna Antis, MD  DULoxetine (CYMBALTA) 60 MG capsule Take 60 mg by mouth 2 (two) times daily.    [provider]  esomeprazole (NEXIUM) 40 MG capsule Take 40 mg by mouth daily at 12 noon.    [provider]  hydrOXYzine (VISTARIL) 25 MG capsule Take 1 capsule (25 mg total) by mouth 3 (three) times daily as needed for anxiety. 01/24/20   Gwyneth Sprout, MD  ibuprofen (ADVIL) 800 MG tablet Take 800 mg by mouth every 8 (eight) hours as needed.    [provider]  LORazepam (ATIVAN) 0.5 MG tablet Take 0.5 mg by mouth 2 (two) times daily.    [provider]  methocarbamol (ROBAXIN) 500 MG tablet Take 1 tablet (500 mg total) by mouth 2 (two) times daily as needed. 01/23/22   Cristie Hem, PA-C  polyethylene glycol (MIRALAX / GLYCOLAX) packet Take 17 g by mouth daily as needed.    [provider]  risperiDONE (RISPERDAL) 1 MG tablet  Take 1-2 mg by mouth See admin instructions. 1 MG at night for agitation and 2 MG at bedtime for sleep    [provider]  rosuvastatin (CRESTOR) 20 MG tablet Take 20 mg by mouth daily.    [provider]  sodium chloride (MURO 128) 5 % ophthalmic ointment Place 1 application into both eyes 4 (four) times daily as needed for eye irritation.    [provider]  traMADol (ULTRAM) 50 MG tablet Take 1-2 tablets (50-100 mg total) by mouth daily as needed. 03/13/22   Cristie Hem, PA-C      Allergies    Flexeril [cyclobenzaprine], Amitriptyline, and Penicillins    Review of Systems   Review of Systems  Physical Exam Updated Vital Signs BP (!) 150/88 (BP  Location: Left Arm)   Pulse (!) 106   Temp 98.7 F (37.1 C) (Oral)   Resp 20   SpO2 99%  Physical Exam Vitals and nursing note reviewed.  Constitutional:      General: She is not in acute distress.    Appearance: She is well-developed.  HENT:     Head: Normocephalic and atraumatic.  Eyes:     Conjunctiva/sclera: Conjunctivae normal.     Pupils: Pupils are equal, round, and reactive to light.  Cardiovascular:     Rate and Rhythm: Regular rhythm. Tachycardia present.     Heart sounds: No murmur heard. Pulmonary:     Effort: Pulmonary effort is normal. No respiratory distress.     Breath sounds: Normal breath sounds. No wheezing or rales.  Abdominal:     General: There is no distension.     Palpations: Abdomen is soft.     Tenderness: There is no abdominal tenderness. There is no guarding or rebound.  Musculoskeletal:        General: No tenderness. Normal range of motion.     Cervical back: Normal range of motion and neck supple.  Skin:    General: Skin is warm and dry.     Findings: No erythema or rash.  Neurological:     Mental Status: She is alert and oriented to person, place, and time.     Sensory: No sensory deficit.     Motor: No weakness.     Gait: Gait normal.  Psychiatric:        Speech: Speech is delayed.        Behavior: Behavior is cooperative.        Thought Content: Thought content is paranoid and delusional. Thought content does not include homicidal or suicidal ideation.        Cognition and Memory: Memory is impaired.     Comments: Patient most of the time has good eye contact and attention but occasionally stares off and has prolonged time to answer questions.     ED Results / Procedures / Treatments   Labs (all labs ordered are listed, but only abnormal results are displayed) Labs Reviewed  SARS CORONAVIRUS 2 BY RT PCR  CBC WITH DIFFERENTIAL/PLATELET  BASIC METABOLIC PANEL  URINALYSIS, W/ REFLEX TO CULTURE (INFECTION SUSPECTED)  ETHANOL     EKG None  Radiology DG Chest Port 1 View  Result Date: 04/03/2023 CLINICAL DATA:  Neck clearance. EXAM: PORTABLE CHEST 1 VIEW COMPARISON:  CXR 11/06/22 FINDINGS: No pleural effusion. No pneumothorax. There is a hazy opacity in the right lung base which could represent atelectasis or infection. Cardiac and mediastinal contours. No radiographically apparent displaced rib fractures. Visualized upper abdomen is unremarkable. IMPRESSION:  1. Hazy opacity in the right lung base could represent atelectasis or infection. 2. No radiographically apparent displaced rib fractures. Electronically Signed   By: Lorenza Cambridge M.D.   On: 04/03/2023 16:08    Procedures Procedures    Medications Ordered in ED Medications - No data to display  ED Course/ Medical Decision Making/ A&P                             Medical Decision Making Amount and/or Complexity of Data Reviewed Labs: ordered. Radiology: ordered and independent interpretation performed. Decision-making details documented in ED Course.   Pt with multiple medical problems and comorbidities and presenting today with a complaint that caries a high risk for morbidity and mortality.  Here today with worsening delusions, paranoia and concern for her own safety.  Patient is here voluntarily.  Speaking with patient's daughter she has not been taking her medications regularly either.  Patient at this time is not homicidal or suicidal.  She has no focal findings on exam and is medically clear.  I have independently visualized and interpreted pt's images today. Patient had a screening chest x-ray which shows some atelectasis but no evidence of infection.  TTS consulted for further evaluation.  Will need follow-up on labs.  Home meds ordered.         Final Clinical Impression(s) / ED Diagnoses Final diagnoses:  None    Rx / DC Orders ED Discharge Orders     None         Gwyneth Sprout, MD 04/03/23 1641

## 2023-04-03 NOTE — ED Notes (Signed)
Frequent request to get her clothes and go home redirected several times by staff for attempting to leave TCU unit. Medicated for restless behavior and borderline agitation and elopement.

## 2023-04-03 NOTE — Consult Note (Signed)
BH ED ASSESSMENT   Reason for Consult: delusional, paranoid, hallucinating  Referring Physician: Dr. Anitra Lauth  Patient Identification: Bridget [name] MRN:  [id number] ED Chief Complaint: Schizoaffective disorder, bipolar type  Diagnosis:  Principal Problem:   Schizoaffective disorder, bipolar type Active Problems:   GAD (generalized anxiety disorder)   ED Assessment Time Calculation: Start Time: 1620 Stop Time: 1700 Total Time in Minutes (Assessment Completion): 40   HPI: Per Triage Note: Patient bright to [facility] by [employer] and behavioral team.. Patient from [facility]. Per [employer] and behavioral person, here for medication management.   Subjective: Bridget [name], 67 y.o., female patient seen face to face by this provider, consulted with Dr. Lucianne Muss; and chart reviewed on 04/03/23.  On evaluation Bridget [name] provider asked patient how she is doing, patient reports "I am doing fine thank God."She asked provider what does it mean to be delusional, states that her mind is saying she is delusional she says that she has been seeing figures on the wall of a Caucasian female with him as appropriate, she does not know this man and he is not the same man telling her that she is delusional.  She denies SI/HI, currently denies HI/VH, states she hears and sees things from time to time.  She says that her daughter lives with her, she nor her daughter do not work, states she is not sure of the medications that she is taking but she is compliant with them and she feels that they work fine.  Patient denies using any drugs or alcohol.  UDS is negative and BAL is less than 10. She rates her anxiety and depression/ 4/10 with 10 being severe.  States her appetite and sleep are fair.  She stated that she was at [facility] about 2 weeks ago, said "the Lord told me I was going to have a heart attack, so I admitted myself to the hospital."Patient states that she did not have a heart attack. She informed  provider that she uses [facility], stated that the services were okay, they come and see her from time to time.  During evaluation Bridget [name] is sitting on her bed in no acute distress. She is alert, oriented x 3, calm, cooperative and attentive. Her mood is euthymic with congruent/bizarre affect. She has normal speech, and behavior.  Objectively there is no evidence of psychosis/mania or delusional thinking. Patient is able to converse coherently, no distractibility, or pre-occupation. She denies suicidal/self-harm/homicidal ideation, and paranoia.   Spoke with patient's daughter with patient's permission Bridget [name], she stated that her mother is hearing voices and acting out what the voices tell her, such as she recently threw away all her pots and pans because she thought the food was stick to the pots and pans, and she recently threw away the paper towels and told the paper she had just brought, daughter asked her why she did it and she stated because the voices told her to.  She states that her mother has not been taking her medications.  She says that her mother locked herself in her room and just sits in the room and cries, no one knows what is wrong with her, stated that 1 day last week mother packed up everybody's clothes in the house and put them in her car and says she had to get them away because the police were chasing her, after that the daughter took her keys said that patient became aggressive, when  she is normally not aggressive.  She also stated that her mother is supposed to have a knee replacement on May 6, and the surgeons told her if her mother is not mentally stable they cannot do the surgery, she states her mother is not functional to take her medications so that she is taking medications out of sequence she took her medications for May 1 on today, and last week she missed 4 days in a row of taking her medications.  She states that her sister lives with her mother but  her sister is not mentally stable.  She stated that patient's sister came to visit her recently and that she would not let her sister in the house because she told her daughter "I was going through something and I was delusional. "Bridget [name] stated her mother was at [facility] 2 weeks ago because she said "father god, and them told me to get a stent in my heart.  "She states that her mother appetite is good and her sleep is poor.  She states that her mother has a history of disappearing a few years ago she left the house and was found in [employer] was gone for 3 days which happened in January and said when they found mother she had on no shirt and no shoes.  She stated that the last time she noticed her mother well was about a month ago, she states that her mother goes on a decline when she first starts feeling guilty and shameful of things and apologizes to everyone and then she rapidly goes downhill and then she will try and run away/disappear.  Past Psychiatric History: Hallucinations/schizoaffective disorder  Risk to Self: Yes (delusional) Risk to Others:  No  Prior Inpatient Therapy:  Yes  Prior Outpatient Therapy: Yes    [place] Scale:  Flowsheet Row ED from 04/03/2023 in [facility] Emergency Department at [facility] ED from 11/06/2022 in [facility] Emergency Department at [facility] Admission (Discharged) from 10/30/2021 in [facility]  C-SSRS RISK CATEGORY No Risk No Risk No Risk       AIMS:  , , ,  ,   ASAM:    Substance Abuse:     Past Medical History:  Past Medical History:  Diagnosis Date   Anxiety    Back pain    low back - arthritis   Barrett's esophagus    Chronic headaches    Constipation    miralax daily   Depression    Dry mouth    biotene   Fibromyalgia    GERD (gastroesophageal reflux disease)    Heart murmur    Hyperlipemia    Insomnia    Myoclonus    patient states "no longer an issue"   Osteoarthritis    lower back,  shoulders, hands   Schizoaffective disorder    SVD (spontaneous vaginal delivery)    x 3   Vertigo    Wears dentures    full    Past Surgical History:  Procedure Laterality Date   ABDOMINAL HYSTERECTOMY     BREAST EXCISIONAL BIOPSY Right    BREAST EXCISIONAL BIOPSY Right    BREAST EXCISIONAL BIOPSY Right    BREAST EXCISIONAL BIOPSY Right    BUNIONECTOMY Bilateral    COLONOSCOPY  08/2013   HAND SURGERY Right    carpel tunnel surgery   INJECTION KNEE Bilateral 10/30/2021   Procedure: BILATERAL KNEE INJECTIONS;  Surgeon: Tarry Kos, MD;  Location:  SURGERY  CENTER;  Service: Orthopedics;  Laterality: Bilateral;   MULTIPLE TOOTH EXTRACTIONS     full dentures   SHOULDER ARTHROSCOPY WITH ROTATOR CUFF REPAIR AND SUBACROMIAL DECOMPRESSION Right 10/30/2021   Procedure: RIGHT SHOULDER ARTHROSCOPY WITH ROTATOR CUFF REPAIR AND SUBACROMIAL DECOMPRESSION, DEBRIDEMENT;  Surgeon: Tarry Kos, MD;  Location: [facility];  Service: Orthopedics;  Laterality: Right;   TRIGGER FINGER RELEASE Bilateral    thumbs   Family History:  Family History  Problem Relation Age of Onset   Diabetes Mother    Prostate cancer Father    High blood pressure Sister    Colon cancer Neg Hx    Colon polyps Neg Hx    Rectal cancer Neg Hx    Stomach cancer Neg Hx      Social History:  Social History   Substance and Sexual Activity  Alcohol Use No     Social History   Substance and Sexual Activity  Drug Use No    Social History   Socioeconomic History   Marital status: Married    Spouse name: Not on file   Number of children: Not on file   Years of education: Not on file   Highest education level: Not on file  Occupational History   Not on file  Tobacco Use   Smoking status: Never   Smokeless tobacco: Never  Vaping Use   Vaping Use: Never used  Substance and Sexual Activity   Alcohol use: No   Drug use: No   Sexual activity: Not Currently    Birth  control/protection: Post-menopausal    Comment: Hysterectomy  Other Topics Concern   Not on file  Social History Narrative   Not on file   Social Determinants of Health   Financial Resource Strain: Not on file  Food Insecurity: Not on file  Transportation Needs: Not on file  Physical Activity: Not on file  Stress: Not on file  Social Connections: Not on file     Allergies:   Allergies  Allergen Reactions   Flexeril [Cyclobenzaprine] Other (See Comments)    Urinary retention   Amitriptyline Other (See Comments)    Urinary retention   Penicillins Rash    Has patient had a PCN reaction causing immediate rash, facial/tongue/throat swelling, SOB or lightheadedness with hypotension: yes Has patient had a PCN reaction causing severe rash involving mucus membranes or skin necrosis: no Has patient had a PCN reaction that required hospitalization: No Has patient had a PCN reaction occurring within the last 10 years: No If all of the above answers are "NO", then may proceed with Cephalosporin use.     Labs:  Results for orders placed or performed during the hospital encounter of 04/03/23 (from the past 48 hour(s))  CBC with Differential/Platelet     Status: Abnormal   Collection Time: 04/03/23  5:33 PM  Result Value Ref Range   WBC 6.7 4.0 - 10.5 K/uL   RBC 3.49 (L) 3.87 - 5.11 MIL/uL   Hemoglobin 10.4 (L) 12.0 - 15.0 g/dL   HCT 16.1 (L) 09.6 - 04.5 %   MCV 93.7 80.0 - 100.0 fL   MCH 29.8 26.0 - 34.0 pg   MCHC 31.8 30.0 - 36.0 g/dL   RDW 40.9 81.1 - 91.4 %   Platelets 293 150 - 400 K/uL   nRBC 0.0 0.0 - 0.2 %   Neutrophils Relative % 67 %   Neutro Abs 4.4 1.7 - 7.7 K/uL   Lymphocytes Relative 23 %  Lymphs Abs 1.6 0.7 - 4.0 K/uL   Monocytes Relative 10 %   Monocytes Absolute 0.7 0.1 - 1.0 K/uL   Eosinophils Relative 0 %   Eosinophils Absolute 0.0 0.0 - 0.5 K/uL   Basophils Relative 0 %   Basophils Absolute 0.0 0.0 - 0.1 K/uL   Immature Granulocytes 0 %   Abs Immature  Granulocytes 0.02 0.00 - 0.07 K/uL    Comment: Performed at [facility], [address], [place], [place] [address]    No current facility-administered medications for this encounter.   Current Outpatient Medications  Medication Sig Dispense Refill   ARIPiprazole (ABILIFY) 10 MG tablet Take 10 mg by mouth daily.     celecoxib (CELEBREX) 200 MG capsule Take 200 mg by mouth daily.     dicyclomine (BENTYL) 20 MG tablet Take 1 tablet (20 mg total) by mouth 2 (two) times daily. 20 tablet 0   Doxepin HCl 6 MG TABS Take 1 tablet (6 mg total) by mouth at bedtime. 30 tablet 1   DULoxetine (CYMBALTA) 60 MG capsule Take 60 mg by mouth 2 (two) times daily.     esomeprazole (NEXIUM) 40 MG capsule Take 40 mg by mouth daily at 12 noon.     hydrOXYzine (VISTARIL) 25 MG capsule Take 1 capsule (25 mg total) by mouth 3 (three) times daily as needed for anxiety. 30 capsule 0   ibuprofen (ADVIL) 800 MG tablet Take 800 mg by mouth every 8 (eight) hours as needed.     LORazepam (ATIVAN) 0.5 MG tablet Take 0.5 mg by mouth 2 (two) times daily.     methocarbamol (ROBAXIN) 500 MG tablet Take 1 tablet (500 mg total) by mouth 2 (two) times daily as needed. 20 tablet 0   polyethylene glycol (MIRALAX / GLYCOLAX) packet Take 17 g by mouth daily as needed.     risperiDONE (RISPERDAL) 1 MG tablet Take 1-2 mg by mouth See admin instructions. 1 MG at night for agitation and 2 MG at bedtime for sleep     rosuvastatin (CRESTOR) 20 MG tablet Take 20 mg by mouth daily.     sodium chloride (MURO 128) 5 % ophthalmic ointment Place 1 application into both eyes 4 (four) times daily as needed for eye irritation.     traMADol (ULTRAM) 50 MG tablet Take 1-2 tablets (50-100 mg total) by mouth daily as needed. 20 tablet 0    Musculoskeletal:  Patient observed resting in bed  Psychiatric Specialty Exam: Presentation  General Appearance:  Disheveled  Eye Contact: Fleeting  Speech: Clear and Coherent  Speech  Volume: Normal  Handedness:No data recorded  Mood and Affect  Mood: Euthymic  Affect: Flat   Thought Process  Thought Processes: Coherent  Descriptions of Associations:Loose  Orientation:Full (Time, Place and Person)  Thought Content:Scattered; Delusions  History of Schizophrenia/Schizoaffective disorder:Yes  Duration of Psychotic Symptoms:Greater than six months  Hallucinations:Hallucinations: Auditory; Visual  Ideas of Reference:No data recorded Suicidal Thoughts:Suicidal Thoughts: No  Homicidal Thoughts:Homicidal Thoughts: No   Sensorium  Memory: Immediate Fair; Recent Fair  Judgment: Fair  Insight: Fair   Chartered certified accountant: Fair  Attention Span: Fair  Recall: Fair  Fund of Knowledge: Good  Language: Good   Psychomotor Activity  Psychomotor Activity: Psychomotor Activity: Normal   Assets  Assets: Communication Skills; Social Support; Housing    Sleep  Sleep: Sleep: Poor   Physical Exam: Physical Exam Vitals and nursing note reviewed. Exam conducted with a chaperone present.  Musculoskeletal:  Cervical back: Normal range of motion.  Neurological:     Mental Status: She is alert.  Psychiatric:        Attention and Perception: Attention normal.        Mood and Affect: Affect is blunt and flat.        Speech: Speech normal.        Behavior: Behavior normal. Behavior is cooperative.        Thought Content: Thought content is delusional.        Cognition and Memory: Memory is impaired.        Judgment: Judgment is inappropriate.    Review of Systems  Constitutional: Negative.   HENT: Negative.    Psychiatric/Behavioral:  Positive for hallucinations.        Patient is delusional.    Blood pressure (!) 150/88, pulse (!) 106, temperature 98.7 F (37.1 C), temperature source Oral, resp. rate 20, SpO2 99 %. There is no height or weight on file to calculate BMI.   Medical Decision Making: Patient  case review and discussed with Dr. Lucianne Muss. Patient needs inpatient psychiatric admission for stabilization and treatment.  Patient's daughter feels that she is a threat and a danger to self due to her current delusional state.  Patient is currently voluntary, if she attempts to elope with IVC.   Disposition: Recommend psychiatric Inpatient admission when medically cleared.  [name], PMHNP 04/03/2023 5:57 PM

## 2023-04-03 NOTE — ED Triage Notes (Signed)
Patient bright to [facility] by [employer] and behavioral team..  Patient from [facility].  Per [employer] and behavioral person, here for medication management.

## 2023-04-04 LAB — URINALYSIS, W/ REFLEX TO CULTURE (INFECTION SUSPECTED)
Bilirubin Urine: NEGATIVE
Glucose, UA: NEGATIVE mg/dL
Hgb urine dipstick: NEGATIVE
Ketones, ur: NEGATIVE mg/dL
Leukocytes,Ua: NEGATIVE
Nitrite: NEGATIVE
Protein, ur: 30 mg/dL — AB
Specific Gravity, Urine: 1.026 (ref 1.005–1.030)
pH: 5 (ref 5.0–8.0)

## 2023-04-04 LAB — RAPID URINE DRUG SCREEN, HOSP PERFORMED
Amphetamines: NOT DETECTED
Barbiturates: NOT DETECTED
Benzodiazepines: POSITIVE — AB
Cocaine: NOT DETECTED
Opiates: NOT DETECTED
Tetrahydrocannabinol: NOT DETECTED

## 2023-04-04 MED ORDER — ACETAMINOPHEN 325 MG PO TABS
650.0000 mg | ORAL_TABLET | Freq: Once | ORAL | Status: AC
  Administered 2023-04-04: 650 mg via ORAL
  Filled 2023-04-04: qty 2

## 2023-04-04 NOTE — Progress Notes (Signed)
LCSW Progress Note  [id number]   Bridget [name]  04/04/2023  12:43 AM    Inpatient Behavioral Health Placement  Pt meets inpatient criteria per [name], PMHNP. There are no available beds within [facility] system per [name] [name], RN. Referral was sent to the following facilities;   Destination  Service Provider Address Phone Fax  [facility]  [address] [place] [address] [phone]  [facility] [address]  [address] [place] [address] [phone]  [facility]  [address] [place], [place] [address] [phone]  [facility]  [address], [place] [address] [phone]  [facility]  [address] [place], [place] [address] [phone]  [facility]  [address] [place] [address] [phone]  [facility]  [address], [place] [address] [phone]  [facility]  [address] [place] [address] [phone]  [facility]  [address] [place] [address] [phone]  [facility]  [address] [place] [address] [phone]  [facility]  [address], [place] [address] [phone]  [facility]  [address] [place] [address] [phone]  [facility]  [address] [place] [address] [phone]  [facility]  [address], [place] [address] [phone]  [facility]  [address], [place] [address] [phone]  [facility]  [address]  [place] [address] [phone]  [facility]  [address] [place] [address] [phone]  [facility]  [address] [place], [place] [address] [phone]  [facility]  [address], [place] [address] [phone]  [facility]  [address] [place] [address] [phone]  [facility]  [address] [place] [address] [phone]  [facility]  [address] [place] [address] [phone]  [facility]  [address] [place] [address] [phone]  [facility]  [address] [place] [address] [phone]  [facility]  [address], [place] [address] [phone]    Situation ongoing,  CSW will follow up.    [name], MSW, [facility] 04/04/2023 12:43 AM

## 2023-04-04 NOTE — ED Notes (Signed)
Patient alert at this time. Patient responds to her name and engages in appropriate conversation. Patient provided with breakfast tray and initially would not eat. After some time, patient did eat some of tray. Writer administered medications to patient, patient was able to swallow pills whole without difficulty. Pain assessment - patient states her knees hurt a little bit but she does not want any medication at this time.

## 2023-04-04 NOTE — Progress Notes (Signed)
This CSW received a phone call from [facility] advising that pt is under review PENDING: UDA and UA.  CSW notified assigned nurse Alfonzo Feller, RN, This CSW will follow up during 1st shift as current shift has ended.    [name], MSW, LCSWA 04/04/2023 1:34 AM

## 2023-04-04 NOTE — Progress Notes (Addendum)
[facility] Denied pt bed offer   This CSW received a phone call from [facility] and pt has been denied due to no beds. This CSW will follow up with referral during 1st shift.   [name], MSW, LCSWA 04/04/2023 1:18 AM

## 2023-04-05 DIAGNOSIS — F250 Schizoaffective disorder, bipolar type: Secondary | ICD-10-CM | POA: Diagnosis not present

## 2023-04-05 MED ORDER — ACETAMINOPHEN 325 MG PO TABS
650.0000 mg | ORAL_TABLET | Freq: Four times a day (QID) | ORAL | Status: DC | PRN
  Administered 2023-04-05 – 2023-04-08 (×4): 650 mg via ORAL
  Filled 2023-04-05 (×4): qty 2

## 2023-04-05 NOTE — ED Notes (Signed)
NT sitter reported to this writer that she observed Bridget [name] attempting to throw her bra  away in the trash can and when approached she admitted to throwing away her dentures 1 or 2 days ago. Room 31 trash can was searched along with the pt room and her locker without finding dentures.

## 2023-04-05 NOTE — ED Notes (Signed)
Nurse received a call from [place] at [facility] regarding a possible bed for pt. Bridget [name] stated she will not be able to confirm until the morning but she wanted to ensure pt still needed placement, advised Brooke per notes yes pt still needs placement. Bridget [name] stated she will call back in the morning with more information.

## 2023-04-05 NOTE — ED Provider Notes (Signed)
  Physical Exam  BP 120/78 (BP Location: Left Wrist)   Pulse 86   Temp 98 F (36.7 C) (Oral)   Resp 18   SpO2 100%   Physical Exam  Procedures  Procedures  ED Course / MDM    Medical Decision Making Amount and/or Complexity of Data Reviewed Labs: ordered. Radiology: ordered.  Risk Prescription drug management.   Patient is pending inpatient psychiatric treatment.       Benjiman Core, MD 04/05/23 [id number]

## 2023-04-05 NOTE — Progress Notes (Signed)
[facility] Psych ED Progress Note  04/05/2023 8:53 AM Bridget [name]  MRN:  [id number]    Principal Problem: Schizoaffective disorder, bipolar type Diagnosis:  Principal Problem:   Schizoaffective disorder, bipolar type Active Problems:   GAD (generalized anxiety disorder)   ED Assessment Time Calculation: Start Time: 0830 Stop Time: 0845 Total Time in Minutes (Assessment Completion): 15   Subjective:  On evaluation today, the patient is sitting on the side of her bed.  She is calm and cooperative during this assessment. Her appearance is appropriate for environment. Her eye contact is good.  Speech is clear and coherent, normal pace and normal volume. She reports her mood as "ok".  Affect is bizarre.  Thought process is coherent. Thought content is within normal limits.  She currently denies visual hallucinations, but continues to endorse auditory hallucinations.  No indication that she is responding to internal stimuli during this assessment.  No delusions elicited during this assessment.  She denies suicidal ideations.  She denies homicidal ideations. Support, encouragement and reassurance provided about ongoing stressors and patient provided with opportunity for questions.     Past Psychiatric History: Hallucinations/schizoaffective disorder   [place] Scale:  Flowsheet Row ED from [facility]/03/20[facility] in [facility] at [facility] ED from 11/06/2022 in [facility] Emergency Department at [facility] Admission (Discharged) from 10/30/2021 in [facility]  C-SSRS RISK CATEGORY No Risk No Risk No Risk       Past Medical History:  Past Medical History:  Diagnosis Date   Anxiety    Back pain    low back - arthritis   Barrett's esophagus    Chronic headaches    Constipation    miralax daily   Depression    Dry mouth    biotene   Fibromyalgia    GERD (gastroesophageal reflux disease)    Heart murmur    Hyperlipemia    Insomnia    Myoclonus     patient states "no longer an issue"   Osteoarthritis    lower back, shoulders, hands   Schizoaffective disorder    SVD (spontaneous vaginal delivery)    x 3   Vertigo    Wears dentures    full    Past Surgical History:  Procedure Laterality Date   ABDOMINAL HYSTERECTOMY     BREAST EXCISIONAL BIOPSY Right    BREAST EXCISIONAL BIOPSY Right    BREAST EXCISIONAL BIOPSY Right    BREAST EXCISIONAL BIOPSY Right    BUNIONECTOMY Bilateral    COLONOSCOPY  08/2013   HAND SURGERY Right    carpel tunnel surgery   INJECTION KNEE Bilateral 10/30/2021   Procedure: BILATERAL KNEE INJECTIONS;  Surgeon: Tarry Kos, MD;  Location: [facility];  Service: Orthopedics;  Laterality: Bilateral;   MULTIPLE TOOTH EXTRACTIONS     full dentures   SHOULDER ARTHROSCOPY WITH ROTATOR CUFF REPAIR AND SUBACROMIAL DECOMPRESSION Right 10/30/2021   Procedure: RIGHT SHOULDER ARTHROSCOPY WITH ROTATOR CUFF REPAIR AND SUBACROMIAL DECOMPRESSION, DEBRIDEMENT;  Surgeon: Tarry Kos, MD;  Location:  SURGERY CENTER;  Service: Orthopedics;  Laterality: Right;   TRIGGER FINGER RELEASE Bilateral    thumbs   Family History:  Family History  Problem Relation Age of Onset   Diabetes Mother    Prostate cancer Father    High blood pressure Sister    Colon cancer Neg Hx    Colon polyps Neg Hx    Rectal cancer Neg Hx    Stomach cancer Neg Hx  Social History:  Social History   Substance and Sexual Activity  Alcohol Use No     Social History   Substance and Sexual Activity  Drug Use No    Social History   Socioeconomic History   Marital status: Married    Spouse name: Not on file   Number of children: Not on file   Years of education: Not on file   Highest education level: Not on file  Occupational History   Not on file  Tobacco Use   Smoking status: Never   Smokeless tobacco: Never  Vaping Use   Vaping Use: Never used  Substance and Sexual Activity   Alcohol use: No   Drug  use: No   Sexual activity: Not Currently    Birth control/protection: Post-menopausal    Comment: Hysterectomy  Other Topics Concern   Not on file  Social History Narrative   Not on file   Social Determinants of Health   Financial Resource Strain: Not on file  Food Insecurity: Not on file  Transportation Needs: Not on file  Physical Activity: Not on file  Stress: Not on file  Social Connections: Not on file    Sleep: Good  Appetite:  Good  Current Medications: Current Facility-Administered Medications  Medication Dose Route Frequency Provider Last Rate Last Admin   hydrOXYzine (ATARAX) tablet 25 mg  25 mg Oral TID PRN Virgina Norfolk, DO   25 mg at 04/04/23 1000   LORazepam (ATIVAN) tablet 0.5 mg  0.5 mg Oral BID Curatolo, Adam, DO   0.5 mg at 04/04/23 2140   pantoprazole (PROTONIX) EC tablet 40 mg  40 mg Oral Daily Curatolo, Adam, DO   40 mg at 04/04/23 1000   risperiDONE (RISPERDAL) tablet 1-2 mg  1-2 mg Oral QHS Curatolo, Adam, DO   1 mg at 04/04/23 2140   rosuvastatin (CRESTOR) tablet 20 mg  20 mg Oral Daily Curatolo, Adam, DO   20 mg at 04/04/23 1000   Current Outpatient Medications  Medication Sig Dispense Refill   antiseptic oral rinse (BIOTENE) LIQD 15 mLs by Mouth Rinse route 6 (six) times daily.     CEQUA 0.09 % SOLN Place 1 drop into both eyes every 12 (twelve) hours.     chlorhexidine (PERIDEX) 0.12 % solution Use as directed 15 mLs in the mouth or throat See admin instructions. Swish 15 ml's in the mouth for 30 seconds, then spit out the solution. Use after evening mouth care. Do NOT rinse afterwards.     DULoxetine (CYMBALTA) 60 MG capsule Take 120 mg by mouth in the morning.     latanoprost (XALATAN) 0.005 % ophthalmic solution Place 1 drop into both eyes at bedtime.     lidocaine (LIDODERM) 5 % Place 1 patch onto the skin daily as needed (for lower back pain- Remove & Discard patch within 12 hours or as directed by MD).     loratadine (CLARITIN) 10 MG tablet  Take 10 mg by mouth daily.     LORazepam (ATIVAN) 0.5 MG tablet Take 0.5-1.25 mg by mouth See admin instructions. Take 0.5 mg by mouth in the morning and 1.25 mg at bedtime     MYLANTA MAXIMUM STRENGTH 400-400-40 MG/5ML suspension Take 10 mLs by mouth 4 (four) times daily as needed (for heartburn pain).     paliperidone (INVEGA SUSTENNA) 156 MG/ML SUSY injection Inject 156 mg into the muscle every 30 (thirty) days.     pantoprazole (PROTONIX) 40 MG tablet Take 40 mg by  mouth daily before breakfast.     polyethylene glycol (MIRALAX / GLYCOLAX) packet Take 17 g by mouth daily.     PREPARATION H 1-0.25-14.4-15 % CREA Apply 1 application  topically 4 (four) times daily.     rosuvastatin (CRESTOR) 20 MG tablet Take 20 mg by mouth daily.     senna (SENOKOT) 8.6 MG TABS tablet Take 1 tablet by mouth at bedtime.     SODIUM FLUORIDE 5000 PPM 1.1 % CREA dental cream Place 1 Application onto teeth See admin instructions. Apply as directed to the teeth at bedtime- spit out excess and do NOT rinse with water     SYSTANE ULTRA PF 0.4-0.3 % SOLN Place 2 drops into both eyes 4 (four) times daily as needed (for dryness).     TYLENOL 500 MG tablet Take 1,000 mg by mouth 2 (two) times daily.     dicyclomine (BENTYL) 20 MG tablet Take 1 tablet (20 mg total) by mouth 2 (two) times daily. (Patient not taking: Reported on 04/04/2023) 20 tablet 0   Doxepin HCl 6 MG TABS Take 1 tablet (6 mg total) by mouth at bedtime. (Patient not taking: Reported on 04/04/2023) 30 tablet 1   hydrOXYzine (VISTARIL) 25 MG capsule Take 1 capsule (25 mg total) by mouth 3 (three) times daily as needed for anxiety. (Patient not taking: Reported on 04/04/2023) 30 capsule 0   methocarbamol (ROBAXIN) 500 MG tablet Take 1 tablet (500 mg total) by mouth 2 (two) times daily as needed. (Patient not taking: Reported on 04/04/2023) 20 tablet 0   traMADol (ULTRAM) 50 MG tablet Take 1-2 tablets (50-100 mg total) by mouth daily as needed. (Patient not taking:  Reported on 04/04/2023) 20 tablet 0    Lab Results:  Results for orders placed or performed during the hospital encounter of 04/03/23 (from the past 48 hour(s))  CBC with Differential/Platelet     Status: Abnormal   Collection Time: 04/03/23  5:33 PM  Result Value Ref Range   WBC 6.7 4.0 - 10.5 K/uL   RBC 3.49 (L) 3.87 - 5.11 MIL/uL   Hemoglobin 10.4 (L) 12.0 - 15.0 g/dL   HCT 16.1 (L) 09.6 - 04.5 %   MCV 93.7 80.0 - 100.0 fL   MCH 29.8 26.0 - 34.0 pg   MCHC 31.8 30.0 - 36.0 g/dL   RDW 40.9 81.1 - 91.4 %   Platelets 293 150 - 400 K/uL   nRBC 0.0 0.0 - 0.2 %   Neutrophils Relative % 67 %   Neutro Abs 4.4 1.7 - 7.7 K/uL   Lymphocytes Relative 23 %   Lymphs Abs 1.6 0.7 - 4.0 K/uL   Monocytes Relative 10 %   Monocytes Absolute 0.7 0.1 - 1.0 K/uL   Eosinophils Relative 0 %   Eosinophils Absolute 0.0 0.0 - 0.5 K/uL   Basophils Relative 0 %   Basophils Absolute 0.0 0.0 - 0.1 K/uL   Immature Granulocytes 0 %   Abs Immature Granulocytes 0.02 0.00 - 0.07 K/uL    Comment: Performed at [facility], [address] [place], [place] [address]  Basic metabolic panel     Status: None   Collection Time: 04/03/23  5:33 PM  Result Value Ref Range   Sodium 141 135 - 145 mmol/L   Potassium 3.8 3.5 - 5.1 mmol/L   Chloride 106 98 - 111 mmol/L   CO2 27 22 - 32 mmol/L   Glucose, Bld 94 70 - 99 mg/dL  Comment: Glucose reference range applies only to samples taken after fasting for at least 8 hours.   BUN 12 8 - 23 mg/dL   Creatinine, Ser 1.61 0.44 - 1.00 mg/dL   Calcium 8.9 8.9 - 09.6 mg/dL   GFR, Estimated >04 >54 mL/min    Comment: (NOTE) Calculated using the CKD-EPI Creatinine Equation (2021)    Anion gap 8 5 - 15    Comment: Performed at [facility], [address] [place], [place] [address]  SARS Coronavirus 2 by RT PCR (hospital order, performed in [facility] hospital lab) *cepheid single result test* Anterior Nasal Swab     Status: None    Collection Time: 04/03/23  5:45 PM   Specimen: Anterior Nasal Swab  Result Value Ref Range   SARS Coronavirus 2 by RT PCR NEGATIVE NEGATIVE    Comment: (NOTE) SARS-CoV-2 target nucleic acids are NOT DETECTED.  The SARS-CoV-2 RNA is generally detectable in upper and lower respiratory specimens during the acute phase of infection. The lowest concentration of SARS-CoV-2 viral copies this assay can detect is 250 copies / mL. A negative result does not preclude SARS-CoV-2 infection and should not be used as the sole basis for treatment or other patient management decisions.  A negative result may occur with improper specimen collection / handling, submission of specimen other than nasopharyngeal swab, presence of viral mutation(s) within the areas targeted by this assay, and inadequate number of viral copies (<250 copies / mL). A negative result must be combined with clinical observations, patient history, and epidemiological information.  Fact Sheet for Patients:   RoadLapTop.co.za  Fact Sheet for Healthcare Providers: http://kim-miller.com/  This test is not yet approved or  cleared by the [place] FDA and has been authorized for detection and/or diagnosis of SARS-CoV-2 by FDA under an Emergency Use Authorization (EUA).  This EUA will remain in effect (meaning this test can be used) for the duration of the COVID-19 declaration under Section 564(b)(1) of the Act, 21 U.S.C. section 360bbb-3(b)(1), unless the authorization is terminated or revoked sooner.  Performed at [facility], [address] [place], [place] [address]   Urinalysis, w/ Reflex to Culture (Infection Suspected) -Urine, Clean Catch     Status: Abnormal   Collection Time: 04/04/23  1:48 AM  Result Value Ref Range   Specimen Source URINE, CLEAN CATCH    Color, Urine AMBER (A) YELLOW    Comment: BIOCHEMICALS MAY BE AFFECTED BY COLOR   APPearance  TURBID (A) CLEAR   Specific Gravity, Urine 1.026 1.005 - 1.030   pH 5.0 5.0 - 8.0   Glucose, UA NEGATIVE NEGATIVE mg/dL   Hgb urine dipstick NEGATIVE NEGATIVE   Bilirubin Urine NEGATIVE NEGATIVE   Ketones, ur NEGATIVE NEGATIVE mg/dL   Protein, ur 30 (A) NEGATIVE mg/dL   Nitrite NEGATIVE NEGATIVE   Leukocytes,Ua NEGATIVE NEGATIVE   RBC / HPF 0-5 0 - 5 RBC/hpf   WBC, UA 0-5 0 - 5 WBC/hpf    Comment:        Reflex urine culture not performed if WBC <=10, OR if Squamous epithelial cells >5. If Squamous epithelial cells >5 suggest recollection.    Bacteria, UA RARE (A) NONE SEEN   Squamous Epithelial / HPF 6-10 0 - 5 /HPF   Mucus PRESENT    Ca Oxalate Crys, UA PRESENT     Comment: Performed at [facility], [address] [place], [place] [address]  Rapid urine drug screen (hospital  performed)     Status: Abnormal   Collection Time: 04/04/23  6:59 AM  Result Value Ref Range   Opiates NONE DETECTED NONE DETECTED   Cocaine NONE DETECTED NONE DETECTED   Benzodiazepines POSITIVE (A) NONE DETECTED   Amphetamines NONE DETECTED NONE DETECTED   Tetrahydrocannabinol NONE DETECTED NONE DETECTED   Barbiturates NONE DETECTED NONE DETECTED    Comment: (NOTE) DRUG SCREEN FOR MEDICAL PURPOSES ONLY.  IF CONFIRMATION IS NEEDED FOR ANY PURPOSE, NOTIFY LAB WITHIN 5 DAYS.  LOWEST DETECTABLE LIMITS FOR URINE DRUG SCREEN Drug Class                     Cutoff (ng/mL) Amphetamine and metabolites    1000 Barbiturate and metabolites    200 Benzodiazepine                 200 Opiates and metabolites        300 Cocaine and metabolites        300 THC                            50 Performed at [facility], [address] [place], [place] [address]     Blood Alcohol level:  Lab Results  Component Value Date   ETH <10 01/23/2020   ETH <10 02/03/2018    Physical Findings:  CIWA:    COWS:     Musculoskeletal: Strength & Muscle Tone: within normal  limits Gait & Station: normal Patient leans: N/A  Psychiatric Specialty Exam:  Presentation  General Appearance:  Appropriate for Environment  Eye Contact: Good  Speech: Clear and Coherent  Speech Volume: Normal  Handedness:No data recorded  Mood and Affect  Mood: Euthymic  Affect: Flat   Thought Process  Thought Processes: Coherent  Descriptions of Associations:Loose  Orientation:Full (Time, Place and Person)  Thought Content:Scattered  History of Schizophrenia/Schizoaffective disorder:Yes  Duration of Psychotic Symptoms:Greater than six months  Hallucinations:Hallucinations: Auditory; Visual  Ideas of Reference:No data recorded Suicidal Thoughts:Suicidal Thoughts: No  Homicidal Thoughts:Homicidal Thoughts: No   Sensorium  Memory: Immediate Good; Recent Fair  Judgment: Fair  Insight: Fair   Chartered certified accountant: Fair  Attention Span: Fair  Recall: Fair  Fund of Knowledge: Good  Language: Good   Psychomotor Activity  Psychomotor Activity: Psychomotor Activity: Normal   Assets  Assets: Communication Skills; Housing; Social Support   Sleep  Sleep: Sleep: Good    Physical Exam: Physical Exam Vitals and nursing note reviewed. Exam conducted with a chaperone present.  Pulmonary:     Effort: Pulmonary effort is normal.  Neurological:     Mental Status: She is alert.  Psychiatric:        Attention and Perception: She perceives auditory hallucinations.        Mood and Affect: Mood normal. Affect is flat.        Speech: Speech is delayed.        Behavior: Behavior is cooperative.        Thought Content: Thought content is delusional.        Cognition and Memory: Memory normal.        Judgment: Judgment is inappropriate.    Review of Systems  Constitutional: Negative.   HENT: Negative.    Respiratory: Negative.    Psychiatric/Behavioral:  Positive for depression and hallucinations.    Blood  pressure 120/78, pulse 86, temperature 98 F (36.7 C), temperature source Oral,  resp. rate 18, SpO2 100 %. There is no height or weight on file to calculate BMI.     Medical Decision Making: Patient continues to require inpatient Psychiatric hospitalization. Bizarre affect, and it appears she still maybe responding to some internal stimuli.     Jannetta Massey MOTLEY-MANGRUM, PMHNP 04/05/2023, 8:53 AM

## 2023-04-05 NOTE — ED Notes (Signed)
Sleeping , having better night tonight and no restless behavior has been noted. Pt is currently sleeping with no signs of distress or discomfort.

## 2023-04-05 NOTE — Progress Notes (Signed)
04/05/2023 5:23 PM-This CSW faxed updated lab results to [facility].

## 2023-04-05 NOTE — ED Notes (Signed)
Patient asked for her denture, so we were looking for it, in the room; that is when she stated " I trough it away in the trash yesterday".

## 2023-04-05 NOTE — ED Notes (Signed)
Bridget [name] from [facility] patient PCP called for a follow up

## 2023-04-05 NOTE — Progress Notes (Signed)
CSW spoke with Carley Hammed with intake at [facility] on update for pt 's referral for [facility] placement. CSW informed updated labs have been sent and Carley Hammed noted she will follow up via phone.  Arliss Journey MSW, LCSWA 04/05/2023 5:46 PM

## 2023-04-06 DIAGNOSIS — F250 Schizoaffective disorder, bipolar type: Secondary | ICD-10-CM

## 2023-04-06 MED ORDER — HYDROXYZINE HCL 10 MG PO TABS
10.0000 mg | ORAL_TABLET | Freq: Three times a day (TID) | ORAL | Status: DC | PRN
  Administered 2023-04-07 (×2): 10 mg via ORAL
  Filled 2023-04-06 (×2): qty 1

## 2023-04-06 MED ORDER — RISPERIDONE 2 MG PO TABS
2.0000 mg | ORAL_TABLET | Freq: Every day | ORAL | Status: DC
  Administered 2023-04-06 – 2023-04-07 (×2): 2 mg via ORAL
  Filled 2023-04-06 (×2): qty 1

## 2023-04-06 NOTE — Progress Notes (Signed)
[facility] Psych ED Progress Note  04/06/2023 7:09 PM Bridget [name]  MRN:  [id number]   Subjective:  Patient was seen in the room awake but appears dull and sleepy.  She had her eyes closed but answering questions.  Patient admitted to hearing voices saying good things about her and to her.  Then she added that she sees Vision but unable to elaborate on the Vision.  Patient ambulates slowly to the bathroom without assistance.  She presents a bizarre affect-blunted like affect as well.  She is hesitant answering questions but will answer all.  Patient is on Hydroxyzine 25 mg three times a day as needed, same is changed to 10 mg tid as needed to avoid the sedating dose of 25 mg.  Her Risperidone is changed to 2 mg every night.  Patient continues to require inpatient Psychiatry hospitalization for stabilization.  She denies SI/HI and no mention of paranoia.  We will continue to fax record out for availbale bed. Principal Problem: Schizoaffective disorder, bipolar type Diagnosis:  Principal Problem:   Schizoaffective disorder, bipolar type Active Problems:   GAD (generalized anxiety disorder)   ED Assessment Time Calculation: Start Time: 1845 Stop Time: 1909 Total Time in Minutes (Assessment Completion): 24   Past Psychiatric History: see initial Psychiatry evaluation note  [place] Scale:  Flowsheet Row ED from 04/03/2023 in [facility] Emergency Department at [facility] ED from 11/06/2022 in [facility] Emergency Department at [facility] Admission (Discharged) from 10/30/2021 in [facility]  C-SSRS RISK CATEGORY No Risk No Risk No Risk       Past Medical History:  Past Medical History:  Diagnosis Date   Anxiety    Back pain    low back - arthritis   Barrett's esophagus    Chronic headaches    Constipation    miralax daily   Depression    Dry mouth    biotene   Fibromyalgia    GERD (gastroesophageal reflux disease)    Heart murmur    Hyperlipemia    Insomnia     Myoclonus    patient states "no longer an issue"   Osteoarthritis    lower back, shoulders, hands   Schizoaffective disorder    SVD (spontaneous vaginal delivery)    x 3   Vertigo    Wears dentures    full    Past Surgical History:  Procedure Laterality Date   ABDOMINAL HYSTERECTOMY     BREAST EXCISIONAL BIOPSY Right    BREAST EXCISIONAL BIOPSY Right    BREAST EXCISIONAL BIOPSY Right    BREAST EXCISIONAL BIOPSY Right    BUNIONECTOMY Bilateral    COLONOSCOPY  08/2013   HAND SURGERY Right    carpel tunnel surgery   INJECTION KNEE Bilateral 10/30/2021   Procedure: BILATERAL KNEE INJECTIONS;  Surgeon: Tarry Kos, MD;  Location: [facility];  Service: Orthopedics;  Laterality: Bilateral;   MULTIPLE TOOTH EXTRACTIONS     full dentures   SHOULDER ARTHROSCOPY WITH ROTATOR CUFF REPAIR AND SUBACROMIAL DECOMPRESSION Right 10/30/2021   Procedure: RIGHT SHOULDER ARTHROSCOPY WITH ROTATOR CUFF REPAIR AND SUBACROMIAL DECOMPRESSION, DEBRIDEMENT;  Surgeon: Tarry Kos, MD;  Location: [facility];  Service: Orthopedics;  Laterality: Right;   TRIGGER FINGER RELEASE Bilateral    thumbs   Family History:  Family History  Problem Relation Age of Onset   Diabetes Mother    Prostate cancer Father    High blood pressure Sister    Colon cancer  Neg Hx    Colon polyps Neg Hx    Rectal cancer Neg Hx    Stomach cancer Neg Hx    Family Psychiatric  History: see initial psychiatry evaluation note Social History:  Social History   Substance and Sexual Activity  Alcohol Use No     Social History   Substance and Sexual Activity  Drug Use No    Social History   Socioeconomic History   Marital status: Married    Spouse name: Not on file   Number of children: Not on file   Years of education: Not on file   Highest education level: Not on file  Occupational History   Not on file  Tobacco Use   Smoking status: Never   Smokeless tobacco: Never  Vaping Use    Vaping Use: Never used  Substance and Sexual Activity   Alcohol use: No   Drug use: No   Sexual activity: Not Currently    Birth control/protection: Post-menopausal    Comment: Hysterectomy  Other Topics Concern   Not on file  Social History Narrative   Not on file   Social Determinants of Health   Financial Resource Strain: Not on file  Food Insecurity: Not on file  Transportation Needs: Not on file  Physical Activity: Not on file  Stress: Not on file  Social Connections: Not on file    Sleep: Good  Appetite:  Fair  Current Medications: Current Facility-Administered Medications  Medication Dose Route Frequency Provider Last Rate Last Admin   acetaminophen (TYLENOL) tablet 650 mg  650 mg Oral Q6H PRN Benjiman Core, MD   650 mg at 04/06/23 1419   hydrOXYzine (ATARAX) tablet 10 mg  10 mg Oral TID PRN Earney Navy, NP       LORazepam (ATIVAN) tablet 0.5 mg  0.5 mg Oral BID Curatolo, Adam, DO   0.5 mg at 04/06/23 0819   pantoprazole (PROTONIX) EC tablet 40 mg  40 mg Oral Daily Curatolo, Adam, DO   40 mg at 04/06/23 0819   risperiDONE (RISPERDAL) tablet 2 mg  2 mg Oral QHS Maya Arcand C, NP       rosuvastatin (CRESTOR) tablet 20 mg  20 mg Oral Daily Curatolo, Adam, DO   20 mg at 04/06/23 2956   Current Outpatient Medications  Medication Sig Dispense Refill   antiseptic oral rinse (BIOTENE) LIQD 15 mLs by Mouth Rinse route 6 (six) times daily.     CEQUA 0.09 % SOLN Place 1 drop into both eyes every 12 (twelve) hours.     chlorhexidine (PERIDEX) 0.12 % solution Use as directed 15 mLs in the mouth or throat See admin instructions. Swish 15 ml's in the mouth for 30 seconds, then spit out the solution. Use after evening mouth care. Do NOT rinse afterwards.     DULoxetine (CYMBALTA) 60 MG capsule Take 120 mg by mouth in the morning.     latanoprost (XALATAN) 0.005 % ophthalmic solution Place 1 drop into both eyes at bedtime.     lidocaine (LIDODERM) 5 % Place 1  patch onto the skin daily as needed (for lower back pain- Remove & Discard patch within 12 hours or as directed by MD).     loratadine (CLARITIN) 10 MG tablet Take 10 mg by mouth daily.     LORazepam (ATIVAN) 0.5 MG tablet Take 0.5-1.25 mg by mouth See admin instructions. Take 0.5 mg by mouth in the morning and 1.25 mg at bedtime  MYLANTA MAXIMUM STRENGTH 400-400-40 MG/5ML suspension Take 10 mLs by mouth 4 (four) times daily as needed (for heartburn pain).     paliperidone (INVEGA SUSTENNA) 156 MG/ML SUSY injection Inject 156 mg into the muscle every 30 (thirty) days.     pantoprazole (PROTONIX) 40 MG tablet Take 40 mg by mouth daily before breakfast.     polyethylene glycol (MIRALAX / GLYCOLAX) packet Take 17 g by mouth daily.     PREPARATION H 1-0.25-14.4-15 % CREA Apply 1 application  topically 4 (four) times daily.     rosuvastatin (CRESTOR) 20 MG tablet Take 20 mg by mouth daily.     senna (SENOKOT) 8.6 MG TABS tablet Take 1 tablet by mouth at bedtime.     SODIUM FLUORIDE 5000 PPM 1.1 % CREA dental cream Place 1 Application onto teeth See admin instructions. Apply as directed to the teeth at bedtime- spit out excess and do NOT rinse with water     SYSTANE ULTRA PF 0.4-0.3 % SOLN Place 2 drops into both eyes 4 (four) times daily as needed (for dryness).     TYLENOL 500 MG tablet Take 1,000 mg by mouth 2 (two) times daily.     dicyclomine (BENTYL) 20 MG tablet Take 1 tablet (20 mg total) by mouth 2 (two) times daily. (Patient not taking: Reported on 04/04/2023) 20 tablet 0   Doxepin HCl 6 MG TABS Take 1 tablet (6 mg total) by mouth at bedtime. (Patient not taking: Reported on 04/04/2023) 30 tablet 1   hydrOXYzine (VISTARIL) 25 MG capsule Take 1 capsule (25 mg total) by mouth 3 (three) times daily as needed for anxiety. (Patient not taking: Reported on 04/04/2023) 30 capsule 0   methocarbamol (ROBAXIN) 500 MG tablet Take 1 tablet (500 mg total) by mouth 2 (two) times daily as needed. (Patient not  taking: Reported on 04/04/2023) 20 tablet 0   traMADol (ULTRAM) 50 MG tablet Take 1-2 tablets (50-100 mg total) by mouth daily as needed. (Patient not taking: Reported on 04/04/2023) 20 tablet 0    Lab Results: No results found for this or any previous visit (from the past 48 hour(s)).  Blood Alcohol level:  Lab Results  Component Value Date   ETH <10 01/23/2020   ETH <10 02/03/2018    Physical Findings:  CIWA:    COWS:     Musculoskeletal: Strength & Muscle Tone: within normal limits Gait & Station: normal Patient leans: Front  Psychiatric Specialty Exam:  Presentation  General Appearance:  Casual  Eye Contact: Fleeting  Speech: Clear and Coherent; Slow  Speech Volume: Normal  Handedness: Right   Mood and Affect  Mood: Dysphoric  Affect: Blunt   Thought Process  Thought Processes: Linear; Coherent  Descriptions of Associations:Intact  Orientation:Full (Time, Place and Person)  Thought Content:Logical  History of Schizophrenia/Schizoaffective disorder:Yes  Duration of Psychotic Symptoms:Greater than six months  Hallucinations:Hallucinations: Auditory; Visual Description of Auditory Hallucinations: Today hears voices saying good things about her and to her. Description of Visual Hallucinations: sees 'Visions."  Ideas of Reference:None  Suicidal Thoughts:Suicidal Thoughts: No  Homicidal Thoughts:Homicidal Thoughts: No   Sensorium  Memory: Immediate Fair; Recent Fair; Remote Fair  Judgment: Impaired  Insight: Shallow   Executive Functions  Concentration: Fair  Attention Span: Fair  Recall: Fair  Fund of Knowledge: Good  Language: Fair   Psychomotor Activity  Psychomotor Activity: Psychomotor Activity: Decreased   Assets  Assets: Communication Skills; Housing; Desire for Improvement; Social Support   Sleep  Sleep: Sleep:  Good    Physical Exam: Physical Exam Vitals and nursing note reviewed.   Constitutional:      Appearance: She is obese.  HENT:     Head: Normocephalic and atraumatic.     Nose: Nose normal.  Cardiovascular:     Rate and Rhythm: Normal rate and regular rhythm.  Pulmonary:     Effort: Pulmonary effort is normal.     Breath sounds: Normal breath sounds.  Musculoskeletal:        General: Normal range of motion.     Cervical back: Normal range of motion.  Skin:    General: Skin is warm and dry.  Neurological:     General: No focal deficit present.     Mental Status: She is alert and oriented to person, place, and time.  Psychiatric:        Attention and Perception: Attention normal. She perceives auditory and visual hallucinations.        Mood and Affect: Affect is blunt.        Speech: Speech is delayed.        Behavior: Behavior is slowed. Behavior is cooperative.        Cognition and Memory: Cognition and memory normal.    Review of Systems  Constitutional: Negative.   HENT: Negative.    Eyes: Negative.   Respiratory: Negative.    Cardiovascular: Negative.   Gastrointestinal: Negative.   Genitourinary: Negative.   Musculoskeletal: Negative.   Skin: Negative.   Neurological: Negative.   Endo/Heme/Allergies: Negative.   Psychiatric/Behavioral:  Positive for hallucinations. The patient is nervous/anxious.    Blood pressure (!) 170/103, pulse 99, temperature 98.3 F (36.8 C), temperature source Oral, resp. rate 18, SpO2 100 %. There is no height or weight on file to calculate BMI.   Medical Decision Making: Patient continues to require inpatient Psychiatry hospitalization.  She presents blunt and constricted affect.  Speech is slow but clear.  Patient endorses AVH- sees Vision and hears voices saying good things to her and about her.  She is on Risperidone 2 mg at night time while Hydroxyzine is changed to 10 mg tablets as needed three times a day.  We will fax out records for inpatient Mental health admission bed.  Patient denies  SI/HI. Disposition-Inpatient Psychiatry hospitalization.  Earney Navy, NP-PMHNP-BC 04/06/2023, 7:09 PM

## 2023-04-06 NOTE — ED Notes (Signed)
Patient refuse VS tonight. WIll try again later.

## 2023-04-06 NOTE — ED Notes (Signed)
Patient complaining of constipation.  Writer gave her warm prune juice.

## 2023-04-06 NOTE — ED Provider Notes (Signed)
Emergency Medicine Observation Re-evaluation Note  Bridget [name] is a 67 y.o. female, seen on rounds today.  Pt initially presented to the ED for complaints of Psychiatric Evaluation Currently, the patient is resting.  Physical Exam  BP (!) 147/73 (BP Location: Right Arm)   Pulse 87   Temp 97.9 F (36.6 C) (Oral)   Resp 18   SpO2 99%  Physical Exam General: NAD   ED Course / MDM  EKG:   I have reviewed the labs performed to date as well as medications administered while in observation.  Recent changes in the last 24 hours include no acute events reported.  Plan  Current plan is for placement.    Wynetta Fines, MD 04/06/23 [phone]

## 2023-04-06 NOTE — ED Notes (Signed)
Pt c/o sore knees. Given tylenol.

## 2023-04-06 NOTE — Progress Notes (Signed)
10:06 AM - CSW spoke with Donnal Debar at [facility] via phone call. Randi reports pt was denied last night due to no available beds. However, Donnal Debar will review pt again for gero psych placement. CSW will continue to assist and follow with placement.  Cathie Beams, [place]  04/06/2023 10:14 AM

## 2023-04-06 NOTE — Progress Notes (Signed)
1:03 PM - This CSW spoke with Lucky Rathke, Child psychotherapist, at [facility] [phone] via phone call. CSW advised Lucky Rathke that pt has been faxed out to gero psych facilities due to no available beds at [facility] or [facility]. Wysheka requests that CSW reach out to her prior to pt being transported to out of network provider for inpatient psych treatment due to insurance coverage.  Cathie Beams, [place]  04/06/2023 1:10 PM

## 2023-04-06 NOTE — Progress Notes (Signed)
LCSW Progress Note  [id number]   Bridget [name]  04/06/2023  11:38 AM  Description:   Inpatient Psychiatric Referral  Patient was recommended inpatient per Dahlia Byes, NP. There are no available beds at [facility] or [facility], per [facility], Charity fundraiser. Patient was referred to the following facilities:   Destination  Service Provider Address Phone Fax  [facility]  [address] [place] [address] [phone]  [facility] [address]  [address] [place] [address] [phone]  [facility]  [address] [place], [place] [address] [phone]  [facility]  [address] [place] [address] [phone]  [facility]  [address] [place], [place] [address] [phone]  [facility]  [address] [place] [address] [phone]  [facility]  [address], [place] [address] [phone]  [facility]  [address], [place] [address] [phone]  [facility]  [address] [place] [address] [phone]  [facility]  [address] [place] [address] [phone]  [facility]  [address] [place] [address] [phone]  [facility]  [address] [place] [address] [phone]  [facility]  [address] [place] [address] [phone]  [facility]  [address], [place] [address] [phone]  [facility]  [address], [place] [address] [phone]  [facility]  [address]  [place] [address] [phone]  [facility]  [address] [place] [address] [phone]  [facility]  [address] [place], [place] [address] [phone]  [facility]  [address], [place] [address] [phone]  [facility]  [address] [place] [address] [phone]  [facility]  [address] [place] [address] [phone]  [facility]  [address] [place] [address] [phone]  [facility]  [address] [place] [address] [phone]  [facility]  [address] [place] [address] [phone]  [facility]  [address] [place] [address] [phone]    Situation ongoing, CSW to continue following and update chart as more information becomes available.      Cathie Beams, Theresia Majors  04/06/2023 11:38 AM

## 2023-04-07 NOTE — Progress Notes (Signed)
Pt was accepted to [facility] 04/08/2023. Bed assignment: Main campus  Pt meets inpatient criteria per Dahlia Byes, NP  Attending Physician will be Loni Beckwith, MD  Report can be called to: [phone] (this is a pager, please leave call-back number when giving report)  Pt can arrive after 8 AM  Care Team Notified: Dahlia Byes, RN and Jeral Fruit, RN  [employer], [place]  04/07/2023 1:56 PM

## 2023-04-07 NOTE — Progress Notes (Signed)
This CSW inquired to Psych provider Earney Navy, NP about exploring the option of pt being IVC'd if pt would meet criteria. Per first shift CSW/ Dispositions Cathie Beams, LCSWA communicated with out of network providers: [facility], and [facility]. This CSW has attempted to contact both potential bed facilities [facility] [phone], and [facility] [phone] but was unsuccessful. CSW left a HIPAA compliant voicemail requesting a phone call back. CSW unable to communicate with provider due to provider's shift ended. First shift CSW to follow up in the morning.  Care Team notified: Earney Navy, NP, Cathie Beams, LCSWA, Junior Tami Ribas, RN, Gaetano Net, RN, Forestine Chute, RN    [name], MSW, [facility] 04/07/2023 12:09 AM

## 2023-04-07 NOTE — Progress Notes (Incomplete)
This CSW inquired to Psych provider Earney Navy, NP about exploring the option of pt being IVC'd. Per first shift CSW/ Dispositions Cathie Beams, LCSWA communicated with out of network providers:

## 2023-04-07 NOTE — Consult Note (Signed)
Patient continues to require inpatient Psychiatry care.  She has been accepted at [facility] in [place] and will be transported to facility tomorrow morning.  Patient is alert and compliant with Medication.

## 2023-04-07 NOTE — ED Notes (Signed)
Patient in room crying uncontrollably. Unable to deescalate. MD notified.

## 2023-04-07 NOTE — Progress Notes (Signed)
LCSW Progress Note  [id number]   MC HOLLEN  04/07/2023  1:31 PM  Description:   Inpatient Psychiatric Referral  Patient was recommended inpatient per Dahlia Byes, NP. There are no available beds at [facility] or [facility], per Rona Ravens, RN. Patient was referred to the following facilities:   Destination  Service Provider Address Phone [facility]  [address], [place] [address] [phone]  [facility]  [address] [place] [address] [phone]  [facility]  [address] [place] [address] [place], [place] [address] [phone]  [facility]  [address] [place] [address] [phone]  [facility]  [address] [place] [address] [phone]  [facility]  [address], [place] [address] [phone]  [facility]  [address], [place] [address] [phone]  [facility]  [address] [place] [address] [phone]  [facility]  [address], [place] [address] [phone]  [facility]  [address], [place] [address] [phone]  [facility]  [address], [place] [address] [phone]  [facility]  [address] [place] [address] [phone]  [facility]  [address] [place] [address] [phone]  [facility]  [address] [place] [address] [phone]  [facility]  [phone] [address] [place] [address] [phone]  [facility]  [address] [place] [address] [phone]    Situation ongoing, CSW to continue following and update chart as more information becomes available.      Cathie Beams, Theresia Majors  04/07/2023 1:31 PM

## 2023-04-07 NOTE — ED Provider Notes (Signed)
Emergency Medicine Observation Re-evaluation Note  Bridget [name] is a 67 y.o. female, seen on rounds today.  Pt initially presented to the ED for complaints of Psychiatric Evaluation Currently, the patient is resting.  Physical Exam  BP (!) 170/103 (BP Location: Left Arm)   Pulse 99   Temp 98.3 F (36.8 C) (Oral)   Resp 18   SpO2 100%  Physical Exam General: NAD   ED Course / MDM  EKG:   I have reviewed the labs performed to date as well as medications administered while in observation.  Recent changes in the last 24 hours include no acute events reported.  Plan  Current plan is for inpatient psych placement.    Wynetta Fines, MD 04/07/23 [phone]

## 2023-04-07 NOTE — ED Notes (Signed)
Patient taking a shower.

## 2023-04-08 NOTE — Progress Notes (Addendum)
Received a call from Mathews Argyle [phone]) with [facility] stating she'd seen in EPIC that pt is accepted to a psychiatric hospital. Lillette Boxer states there must be a single case agreement before the pt is discharged there. She requested to speak to [place], CSW. This CSW informed Toma Copier of the above information via secure chat and requested that she [facility] to [facility].

## 2023-04-08 NOTE — ED Notes (Signed)
[facility] set up done

## 2023-04-08 NOTE — ED Notes (Signed)
RN to call [facility] after [facility] calls back

## 2023-04-08 NOTE — Progress Notes (Addendum)
This CSW was contacted to coordinate transportation for this pt to [facility]. This CSW contacted RN to inquire about whether pt is voluntary or involuntary. RN confirmed pt is VOLUNTARY. Case discussed with [facility] leadership who informed that if pt is voluntary, she should be transported by safe transport. RN aware to call safe transport.  Spoke with Lucky Rathke at [facility] who was under the impression that they needed to set up transport for the pt via [facility]. This CSW informed that [facility] would not be utilized to transport pt. Lucky Rathke also thought that [facility] had to approve transportation. This CSW informed that the hospital is contracted with [facility] and we provide a cost center # at time of set up. Wysheka asked for [facility]'s phone # and this writer provided but informed she cannot guarantee that [facility] will provide her with any information regarding the set up. Wysheka verbalized understanding.

## 2023-04-08 NOTE — ED Notes (Signed)
Callback left for [place]

## 2023-04-08 NOTE — Progress Notes (Signed)
8:15 AM - CSW spoke with Lucky Rathke, [facility], social worker [phone] via phone call. [place] advised CSW of needing to send single case agreement paperwork to [facility] for pt's admission. CSW provided [facility] with [facility] contact information, [place] [phone], and fax number to send single case agreement paperwork to [phone].  Cathie Beams, [place]  04/08/2023 8:24 AM

## 2023-04-08 NOTE — ED Provider Notes (Signed)
Emergency Medicine Observation Re-evaluation Note  Bridget [name] is a 67 y.o. female, seen on rounds today.  Pt initially presented to the ED for complaints of Psychiatric Evaluation Currently, the patient is 67 year old female history of schizoaffective disorder sent to ED [facility] due to erratic behavior and thought disorder Patient remains under IVC She has been medically cleared.  Physical Exam  BP 119/67 (BP Location: Right Arm)   Pulse 80   Temp 98.3 F (36.8 C) (Oral)   Resp 16   SpO2 93%  Physical Exam General: Well-developed well-nourished female does not appear to be in any acute distress Cardiac: Normal heart rate and blood pressure Lungs: Patient with normal respiratory rate oxygen saturations 93%, first 2 prior were 98 and 100% Psych: Patient sleeping on my evaluation  ED Course / MDM  EKG:   I have reviewed the labs performed to date as well as medications administered while in observation.  Recent changes in the last 24 hours include patient accepted to [facility].  Plan  Current plan is for plan is for placement today at [facility].    Margarita Grizzle, MD 04/08/23 [phone]

## 2023-04-08 NOTE — ED Notes (Signed)
Report called to [facility].

## 2023-04-08 NOTE — ED Notes (Signed)
Patient crying c/o leg pain. Patient given tylenol

## 2023-04-09 ENCOUNTER — Other Ambulatory Visit: Payer: Self-pay | Admitting: Physician Assistant

## 2023-04-09 ENCOUNTER — Telehealth: Payer: Self-pay | Admitting: Orthopaedic Surgery

## 2023-04-09 MED ORDER — DOCUSATE SODIUM 100 MG PO CAPS: 100.0000 mg | ORAL_CAPSULE | Freq: Every day | ORAL | 2 refills | Status: DC | PRN

## 2023-04-09 MED ORDER — ONDANSETRON HCL 4 MG PO TABS: 4.0000 mg | ORAL_TABLET | Freq: Three times a day (TID) | ORAL | 0 refills | Status: DC | PRN

## 2023-04-09 MED ORDER — OXYCODONE-ACETAMINOPHEN 5-325 MG PO TABS: 1.0000 | ORAL_TABLET | Freq: Four times a day (QID) | ORAL | 0 refills | Status: DC | PRN

## 2023-04-09 MED ORDER — METHOCARBAMOL 750 MG PO TABS: 750.0000 mg | ORAL_TABLET | Freq: Two times a day (BID) | ORAL | 2 refills | Status: DC | PRN

## 2023-04-09 MED ORDER — ASPIRIN 81 MG PO TBEC: 81.0000 mg | DELAYED_RELEASE_TABLET | Freq: Two times a day (BID) | ORAL | 0 refills | Status: DC

## 2023-04-09 NOTE — Telephone Encounter (Signed)
Thanks

## 2023-04-09 NOTE — Telephone Encounter (Signed)
Shanda Bumps with [facility] called stating patient's left total knee procedure for 04-20-23 will need to be cancelled because patient was transported to [facility] and admitted. She will call back and reschedule once patient has recovered.  Shanda Bumps also called and cancelled patient's pre-op visit scheduled for 04-10-23,

## 2023-04-10 ENCOUNTER — Other Ambulatory Visit (HOSPITAL_COMMUNITY): Payer: Medicare (Managed Care)

## 2023-04-20 ENCOUNTER — Inpatient Hospital Stay (HOSPITAL_COMMUNITY): Admit: 2023-04-20 | Payer: Medicare (Managed Care) | Admitting: Orthopaedic Surgery

## 2023-04-20 DIAGNOSIS — M1712 Unilateral primary osteoarthritis, left knee: Secondary | ICD-10-CM

## 2023-04-30 ENCOUNTER — Encounter: Payer: Self-pay | Admitting: Physical Medicine & Rehabilitation

## 2023-04-30 ENCOUNTER — Encounter
Payer: Medicare (Managed Care) | Attending: Physical Medicine & Rehabilitation | Admitting: Physical Medicine & Rehabilitation

## 2023-04-30 VITALS — BP 134/84 | HR 84 | Ht 64.0 in | Wt 217.0 lb

## 2023-04-30 DIAGNOSIS — M533 Sacrococcygeal disorders, not elsewhere classified: Secondary | ICD-10-CM | POA: Diagnosis present

## 2023-04-30 MED ORDER — BETAMETHASONE SOD PHOS & ACET 6 (3-3) MG/ML IJ SUSP
5.0000 mg | Freq: Once | INTRAMUSCULAR | Status: AC
Start: 2023-04-30 — End: 2023-04-30
  Administered 2023-04-30: 4.8 mg via INTRAMUSCULAR

## 2023-04-30 MED ORDER — LIDOCAINE HCL (PF) 2 % IJ SOLN
1.0000 mL | Freq: Once | INTRAMUSCULAR | Status: AC
Start: 2023-04-30 — End: 2023-04-30
  Administered 2023-04-30: 1 mL

## 2023-04-30 MED ORDER — LIDOCAINE HCL 1 % IJ SOLN
4.0000 mL | Freq: Once | INTRAMUSCULAR | Status: AC
Start: 2023-04-30 — End: 2023-04-30
  Administered 2023-04-30: 4 mL

## 2023-04-30 NOTE — Progress Notes (Signed)
Bilateral sacroiliac injections under fluoroscopic guidance  Indication: Low back and buttocks pain not relieved by medication management and other conservative care.  Informed consent was obtained after describing risks and benefits of the procedure with the patient, this includes bleeding, bruising, infection, paralysis and medication side effects. The patient wishes to proceed and has given written consent. The patient was placed in a prone position. The lumbar and sacral area was marked and prepped with Betadine. A 25-gauge 1-1/2 inch needle was inserted into the skin and subcutaneous tissue and 1 mL of 1% lidocaine was injected into each side. Then a 25-gauge 3.5 inch spinal needle was inserted under fluoroscopic guidance into the left sacroiliac joint. AP and lateral images were utilized. Omnipaque 180x0.5 mL under live fluoroscopy demonstrated no intravascular uptake. Then a solution containing one ML of 6 mg per mL Celestone in 2 ML of 2% lidocaine MPF was injected x1.5 mL. This same procedure was repeated on the right side using the same needle, injectate, and technique. Patient tolerated the procedure well. Post procedure instructions were given. Please see post procedure form.  Meds: Celestone 6mg  Omnipaque 1ml Lidocaine 1% 2ml Lidocaine 2% PF 2 ml

## 2023-04-30 NOTE — Progress Notes (Signed)
  PROCEDURE RECORD [facility]   Name: Bridget [name] DOB:1956-06-25 MRN: [id number]  Date:04/30/2023  Physician: Claudette Laws, MD    Nurse/CMA: Bricelyn Freestone RN  Allergies:  Allergies  Allergen Reactions   Flexeril [Cyclobenzaprine] Other (See Comments)    Urinary retention   Amitriptyline Other (See Comments)    Urinary retention   Penicillins Rash    Has patient had a PCN reaction causing immediate rash, facial/tongue/throat swelling, SOB or lightheadedness with hypotension: yes Has patient had a PCN reaction causing severe rash involving mucus membranes or skin necrosis: no Has patient had a PCN reaction that required hospitalization: No Has patient had a PCN reaction occurring within the last 10 years: No If all of the above answers are "NO", then may proceed with Cephalosporin use.     Consent Signed: Yes.    Is patient diabetic? No.  CBG today?   Pregnant: No. LMP: No LMP recorded. Patient has had a hysterectomy. (age 45-55)  Anticoagulants: no Anti-inflammatory: no Antibiotics: no  Procedure: bilateral sacroiliac steroid injection  Position: Prone Start Time: 1132  End Time: 1141  Fluoro Time: 33  RN/CMA shumakerRN shumakerRn    Time 1113 1145    BP 134/84 132/85    Pulse 84 79    Respirations 14 14    O2 Sat 94 96    S/S 6 6    Pain Level 8/10 7/10     D/C home with [facility], patient A & O X 3, D/C instructions reviewed, and sits independently.

## 2023-04-30 NOTE — Patient Instructions (Signed)
Sacroiliac injection was performed today. A combination of numbing medicine (lidocaine) plus a cortisone medicine (betamethasone) was injected. The injection was done under x-ray guidance. This procedure has been performed to help reduce low back and buttocks pain as well as potentially hip pain. The duration of this injection is variable lasting from hours to  Months. It may repeated if needed. 

## 2023-05-01 ENCOUNTER — Emergency Department (HOSPITAL_BASED_OUTPATIENT_CLINIC_OR_DEPARTMENT_OTHER): Payer: Medicare (Managed Care)

## 2023-05-01 ENCOUNTER — Other Ambulatory Visit: Payer: Self-pay

## 2023-05-01 ENCOUNTER — Encounter (HOSPITAL_BASED_OUTPATIENT_CLINIC_OR_DEPARTMENT_OTHER): Payer: Self-pay | Admitting: Emergency Medicine

## 2023-05-01 ENCOUNTER — Emergency Department (HOSPITAL_BASED_OUTPATIENT_CLINIC_OR_DEPARTMENT_OTHER)
Admission: EM | Admit: 2023-05-01 | Discharge: 2023-05-02 | Disposition: A | Discharge status: Home / Self Care | Payer: Medicare (Managed Care) | Attending: Emergency Medicine | Admitting: Emergency Medicine

## 2023-05-01 DIAGNOSIS — R072 Precordial pain: Secondary | ICD-10-CM | POA: Insufficient documentation

## 2023-05-01 DIAGNOSIS — Z7982 Long term (current) use of aspirin: Secondary | ICD-10-CM | POA: Insufficient documentation

## 2023-05-01 DIAGNOSIS — Z79899 Other long term (current) drug therapy: Secondary | ICD-10-CM | POA: Insufficient documentation

## 2023-05-01 LAB — CBC
HCT: 34.5 % — ABNORMAL LOW (ref 36.0–46.0)
Hemoglobin: 11.3 g/dL — ABNORMAL LOW (ref 12.0–15.0)
MCH: 30.5 pg (ref 26.0–34.0)
MCHC: 32.8 g/dL (ref 30.0–36.0)
MCV: 93 fL (ref 80.0–100.0)
Platelets: 319 10*3/uL (ref 150–400)
RBC: 3.71 MIL/uL — ABNORMAL LOW (ref 3.87–5.11)
RDW: 15.3 % (ref 11.5–15.5)
WBC: 7.6 10*3/uL (ref 4.0–10.5)
nRBC: 0 % (ref 0.0–0.2)

## 2023-05-01 LAB — BASIC METABOLIC PANEL
Anion gap: 8 (ref 5–15)
BUN: 10 mg/dL (ref 8–23)
CO2: 28 mmol/L (ref 22–32)
Calcium: 9 mg/dL (ref 8.9–10.3)
Chloride: 102 mmol/L (ref 98–111)
Creatinine, Ser: 0.86 mg/dL (ref 0.44–1.00)
GFR, Estimated: >60 mL/min (ref >60)
Glucose, Bld: 113 mg/dL — ABNORMAL HIGH (ref 70–99)
Potassium: 3.8 mmol/L (ref 3.5–5.1)
Sodium: 138 mmol/L (ref 135–145)

## 2023-05-01 LAB — TROPONIN I (HIGH SENSITIVITY): Troponin I (High Sensitivity): 3 ng/L (ref <18)

## 2023-05-01 MED ORDER — ALUM & MAG HYDROXIDE-SIMETH 200-200-20 MG/5ML PO SUSP
30.0000 mL | Freq: Once | ORAL | Status: AC
  Administered 2023-05-01: 30 mL via ORAL
  Filled 2023-05-01: qty 30

## 2023-05-01 NOTE — ED Triage Notes (Signed)
Patient arrived via POV c/o chest pain/irritation x 3 hrs pta. Patient states pain/crawling feeling in chest. Patient states pain in left shoulder. Patient is AO x 4, VS WDL, in wheelchair at this time.

## 2023-05-01 NOTE — ED Notes (Signed)
Pt denies pain currently. States she felt a "flutter" in her chest earlier, pt unable to tell me how long this fluttering lasts. Pt also reports epigastric cramping that is intermittent.

## 2023-05-02 LAB — TROPONIN I (HIGH SENSITIVITY): Troponin I (High Sensitivity): 4 ng/L (ref <18)

## 2023-05-02 NOTE — ED Provider Notes (Signed)
[place] [facility] AT [facility] Provider Note   CSN: [id number] Arrival date & time: 05/01/23  2135     History  Chief Complaint  Patient presents with   Chest Pain    Bridget [name] is a 67 y.o. female.  The history is provided by the patient.  Chest Pain Pain location:  L chest Pain quality: dull   Pain radiates to:  Does not radiate Pain severity:  Moderate Onset quality:  Gradual Duration: hours. Timing:  Constant Progression:  Unchanged Chronicity:  New Context: at rest   Relieved by:  Nothing Worsened by:  Nothing Ineffective treatments:  None tried Associated symptoms: no back pain, no claudication, no cough, no fever, no lower extremity edema, no orthopnea, no PND, no shortness of breath, no vomiting and no weakness   Risk factors: not female   Patient with schizophrenia and fibromyalgia presents with with chest pain.  No DOE.  No n/v/d.  No exertional symptoms.  No travel no legs.     Past Medical History:  Diagnosis Date   Anxiety    Back pain    low back - arthritis   Barrett's esophagus    Chronic headaches    Constipation    miralax daily   Depression    Dry mouth    biotene   Fibromyalgia    GERD (gastroesophageal reflux disease)    Heart murmur    Hyperlipemia    Insomnia    Myoclonus    patient states "no longer an issue"   Osteoarthritis    lower back, shoulders, hands   Schizoaffective disorder (HCC)    SVD (spontaneous vaginal delivery)    x 3   Vertigo    Wears dentures    full     Home Medications Prior to Admission medications   Medication Sig Start Date End Date Taking? Authorizing Provider  antiseptic oral rinse (BIOTENE) LIQD 15 mLs by Mouth Rinse route 6 (six) times daily.    [provider]  aspirin EC 81 MG tablet Take 1 tablet (81 mg total) by mouth 2 (two) times daily. To be taken after surgery to prevent blood clots 04/09/23 04/08/24  Cristie Hem, PA-C  CEQUA 0.09 % SOLN Place  1 drop into both eyes every 12 (twelve) hours.    [provider]  chlorhexidine (PERIDEX) 0.12 % solution Use as directed 15 mLs in the mouth or throat See admin instructions. Swish 15 ml's in the mouth for 30 seconds, then spit out the solution. Use after evening mouth care. Do NOT rinse afterwards.    [provider]  dicyclomine (BENTYL) 20 MG tablet Take 1 tablet (20 mg total) by mouth 2 (two) times daily. 11/06/22   Charlynne Pander, MD  docusate sodium (COLACE) 100 MG capsule Take 1 capsule (100 mg total) by mouth daily as needed. 04/09/23 04/08/24  Cristie Hem, PA-C  Doxepin HCl 6 MG TABS Take 1 tablet (6 mg total) by mouth at bedtime. 03/04/23   Olalere, Minna Antis, MD  DULoxetine (CYMBALTA) 60 MG capsule Take 120 mg by mouth in the morning.    [provider]  hydrOXYzine (VISTARIL) 25 MG capsule Take 1 capsule (25 mg total) by mouth 3 (three) times daily as needed for anxiety. 01/24/20   Gwyneth Sprout, MD  latanoprost (XALATAN) 0.005 % ophthalmic solution Place 1 drop into both eyes at bedtime.    [provider]  lidocaine (LIDODERM) 5 % Place 1  patch onto the skin daily as needed (for lower back pain- Remove & Discard patch within 12 hours or as directed by MD).    [provider]  loratadine (CLARITIN) 10 MG tablet Take 10 mg by mouth daily.    [provider]  LORazepam (ATIVAN) 0.5 MG tablet Take 0.5-1.25 mg by mouth See admin instructions. Take 0.5 mg by mouth in the morning and 1.25 mg at bedtime    [provider]  methocarbamol (ROBAXIN) 500 MG tablet Take 1 tablet (500 mg total) by mouth 2 (two) times daily as needed. 01/23/22   Cristie Hem, PA-C  methocarbamol (ROBAXIN-750) 750 MG tablet Take 1 tablet (750 mg total) by mouth 2 (two) times daily as needed for muscle spasms. 04/09/23   Cristie Hem, PA-C  MYLANTA MAXIMUM STRENGTH [phone] MG/5ML suspension Take 10 mLs by mouth 4 (four) times daily as needed  (for heartburn pain).    [provider]  ondansetron (ZOFRAN) 4 MG tablet Take 1 tablet (4 mg total) by mouth every 8 (eight) hours as needed for nausea or vomiting. 04/09/23   Cristie Hem, PA-C  oxyCODONE-acetaminophen (PERCOCET) 5-325 MG tablet Take 1-2 tablets by mouth every 6 (six) hours as needed. To be taken after surgery 04/09/23   Cristie Hem, PA-C  paliperidone (INVEGA SUSTENNA) 156 MG/ML SUSY injection Inject 156 mg into the muscle every 30 (thirty) days.    [provider]  pantoprazole (PROTONIX) 40 MG tablet Take 40 mg by mouth daily before breakfast.    [provider]  polyethylene glycol (MIRALAX / GLYCOLAX) packet Take 17 g by mouth daily.    [provider]  PREPARATION H 1-0.25-14.4-15 % CREA Apply 1 application  topically 4 (four) times daily.    [provider]  rosuvastatin (CRESTOR) 20 MG tablet Take 20 mg by mouth daily.    [provider]  senna (SENOKOT) 8.6 MG TABS tablet Take 1 tablet by mouth at bedtime.    [provider]  SODIUM FLUORIDE 5000 PPM 1.1 % CREA dental cream Place 1 Application onto teeth See admin instructions. Apply as directed to the teeth at bedtime- spit out excess and do NOT rinse with water    [provider]  SYSTANE ULTRA PF 0.4-0.3 % SOLN Place 2 drops into both eyes 4 (four) times daily as needed (for dryness).    [provider]  traMADol (ULTRAM) 50 MG tablet Take 1-2 tablets (50-100 mg total) by mouth daily as needed. 03/13/22   Cristie Hem, PA-C  TYLENOL 500 MG tablet Take 1,000 mg by mouth 2 (two) times daily.    [provider]      Allergies    Flexeril [cyclobenzaprine], Amitriptyline, and Penicillins    Review of Systems   Review of Systems  Constitutional:  Negative for fever.  HENT:  Negative for drooling.   Eyes:  Negative for redness.  Respiratory:  Negative for cough and shortness of breath.   Cardiovascular:  Positive for  chest pain. Negative for orthopnea, claudication and PND.  Gastrointestinal:  Negative for vomiting.  Musculoskeletal:  Negative for back pain.  Neurological:  Negative for weakness.  All other systems reviewed and are negative.   Physical Exam Updated Vital Signs BP 127/78 (BP Location: Right Arm)   Pulse 83   Temp 98 F (36.7 C) (Oral)   Resp 15   Ht 5\' 4"  (1.626 m)   Wt 98.4 kg   SpO2 98%  BMI 37.25 kg/m  Physical Exam Vitals and nursing note reviewed.  Constitutional:      General: She is not in acute distress.    Appearance: Normal appearance. She is well-developed.  HENT:     Head: Normocephalic and atraumatic.     Nose: Nose normal.  Eyes:     Pupils: Pupils are equal, round, and reactive to light.  Cardiovascular:     Rate and Rhythm: Normal rate and regular rhythm.     Pulses: Normal pulses.     Heart sounds: Normal heart sounds.  Pulmonary:     Effort: Pulmonary effort is normal. No respiratory distress.     Breath sounds: Normal breath sounds.  Abdominal:     General: Bowel sounds are normal. There is no distension.     Palpations: Abdomen is soft.     Tenderness: There is no abdominal tenderness. There is no guarding or rebound.  Genitourinary:    Vagina: No vaginal discharge.  Musculoskeletal:        General: Normal range of motion.     Cervical back: Normal range of motion and neck supple.  Skin:    General: Skin is warm and dry.     Capillary Refill: Capillary refill takes less than 2 seconds.     Findings: No erythema or rash.  Neurological:     General: No focal deficit present.     Mental Status: She is alert and oriented to person, place, and time.     Deep Tendon Reflexes: Reflexes normal.  Psychiatric:        Mood and Affect: Mood normal.     ED Results / Procedures / Treatments   Labs (all labs ordered are listed, but only abnormal results are displayed) Results for orders placed or performed during the hospital encounter of 05/01/23   Basic metabolic panel  Result Value Ref Range   Sodium 138 135 - 145 mmol/L   Potassium 3.8 3.5 - 5.1 mmol/L   Chloride 102 98 - 111 mmol/L   CO2 28 22 - 32 mmol/L   Glucose, Bld 113 (H) 70 - 99 mg/dL   BUN 10 8 - 23 mg/dL   Creatinine, Ser 5.40 0.44 - 1.00 mg/dL   Calcium 9.0 8.9 - 98.1 mg/dL   GFR, Estimated >19 >14 mL/min   Anion gap 8 5 - 15  CBC  Result Value Ref Range   WBC 7.6 4.0 - 10.5 K/uL   RBC 3.71 (L) 3.87 - 5.11 MIL/uL   Hemoglobin 11.3 (L) 12.0 - 15.0 g/dL   HCT 78.2 (L) 95.6 - 21.3 %   MCV 93.0 80.0 - 100.0 fL   MCH 30.5 26.0 - 34.0 pg   MCHC 32.8 30.0 - 36.0 g/dL   RDW 08.6 57.8 - 46.9 %   Platelets 319 150 - 400 K/uL   nRBC 0.0 0.0 - 0.2 %  Troponin I (High Sensitivity)  Result Value Ref Range   Troponin I (High Sensitivity) 3 <18 ng/L  Troponin I (High Sensitivity)  Result Value Ref Range   Troponin I (High Sensitivity) 4 <18 ng/L   DG Chest 2 View  Result Date: 05/01/2023 CLINICAL DATA:  Chest pain EXAM: CHEST - 2 VIEW COMPARISON:  Chest radiograph dated Bartolo Montanye 19, 2020 FINDINGS: The heart size and mediastinal contours are within normal limits. Both lungs are clear. Thoracic spondylosis. IMPRESSION: No active cardiopulmonary disease. Electronically Signed   By: Larose Hires D.O.   On: 05/01/2023 22:17   DG  Chest Port 1 View  Result Date: 04/03/2023 CLINICAL DATA:  Neck clearance. EXAM: PORTABLE CHEST 1 VIEW COMPARISON:  CXR 11/06/22 FINDINGS: No pleural effusion. No pneumothorax. There is a hazy opacity in the right lung base which could represent atelectasis or infection. Cardiac and mediastinal contours. No radiographically apparent displaced rib fractures. Visualized upper abdomen is unremarkable. IMPRESSION: 1. Hazy opacity in the right lung base could represent atelectasis or infection. 2. No radiographically apparent displaced rib fractures. Electronically Signed   By: Lorenza Cambridge M.D.   On: 04/03/2023 16:08    EKG  EKG  Interpretation  Date/Time:    Ventricular Rate:    PR Interval:    QRS Duration:   QT Interval:    QTC Calculation:   R Axis:     Text Interpretation:          Radiology DG Chest 2 View  Result Date: 05/01/2023 CLINICAL DATA:  Chest pain EXAM: CHEST - 2 VIEW COMPARISON:  Chest radiograph dated Kellye Mizner 19, 2020 FINDINGS: The heart size and mediastinal contours are within normal limits. Both lungs are clear. Thoracic spondylosis. IMPRESSION: No active cardiopulmonary disease. Electronically Signed   By: Larose Hires D.O.   On: 05/01/2023 22:17    Procedures Procedures    Medications Ordered in ED Medications  alum & mag hydroxide-simeth (MAALOX/MYLANTA) 200-200-20 MG/5ML suspension 30 mL (30 mLs Oral Given 05/01/23 2330)    ED Course/ Medical Decision Making/ A&P                             Medical Decision Making Patient with chest pain  Amount and/or Complexity of Data Reviewed External Data Reviewed: notes.    Details: Previous notes reviewed  Labs: ordered.    Details: All labs reviewed: 2 negative troponins 3/4. Normal white count 7.6, hemoglobin slight low 11.3, normal platelets. Normal sodium 138, normal potassium 3.8, normal creatinine .7  Radiology: ordered and independent interpretation performed.    Details: Negative CXR ECG/medicine tests: ordered.    Details: See muse   Risk OTC drugs. Risk Details: Patient ruled out for MI.  I do not believe this is a PE>  Heart score is 2 low risk for MACE.  I believe symptoms are related to GERD. Stable for discharge.  Strict return.      Final Clinical Impression(s) / ED Diagnoses Final diagnoses:  Precordial pain   Return for intractable cough, coughing up blood, fevers > 100.4 unrelieved by medication, shortness of breath, intractable vomiting, chest pain, shortness of breath, weakness, numbness, changes in speech, facial asymmetry, abdominal pain, passing out, Inability to tolerate liquids or food, cough,  altered mental status or any concerns. No signs of systemic illness or infection. The patient is nontoxic-appearing on exam and vital signs are within normal limits.  I have reviewed the triage vital signs and the nursing notes. Pertinent labs & imaging results that were available during my care of the patient were reviewed by me and considered in my medical decision making (see chart for details). After history, exam, and medical workup I feel the patient has been appropriately medically screened and is safe for discharge home. Pertinent diagnoses were discussed with the patient. Patient was given return precautions.  Rx / DC Orders ED Discharge Orders     None         Alfonso Carden, MD 05/02/23 [phone]

## 2023-05-05 ENCOUNTER — Encounter: Payer: Medicare (Managed Care) | Admitting: Physician Assistant

## 2023-06-08 ENCOUNTER — Other Ambulatory Visit: Payer: Self-pay | Admitting: Family Medicine

## 2023-06-08 DIAGNOSIS — Z0001 Encounter for general adult medical examination with abnormal findings: Secondary | ICD-10-CM

## 2023-06-14 IMAGING — MG MM DIGITAL SCREENING BILAT W/ TOMO AND CAD
6 of 12 series · 6 of 36 positions shown · non-contrast
Comparison: Previous exam(s).

CLINICAL DATA: Screening.

EXAM:
DIGITAL SCREENING BILATERAL MAMMOGRAM WITH TOMOSYNTHESIS AND CAD
TECHNIQUE: Bilateral screening digital craniocaudal and mediolateral oblique
mammograms were obtained. Bilateral screening digital breast
tomosynthesis was performed. The images were evaluated with
computer-aided detection.

[L CC synth-2D (1 of 2)]
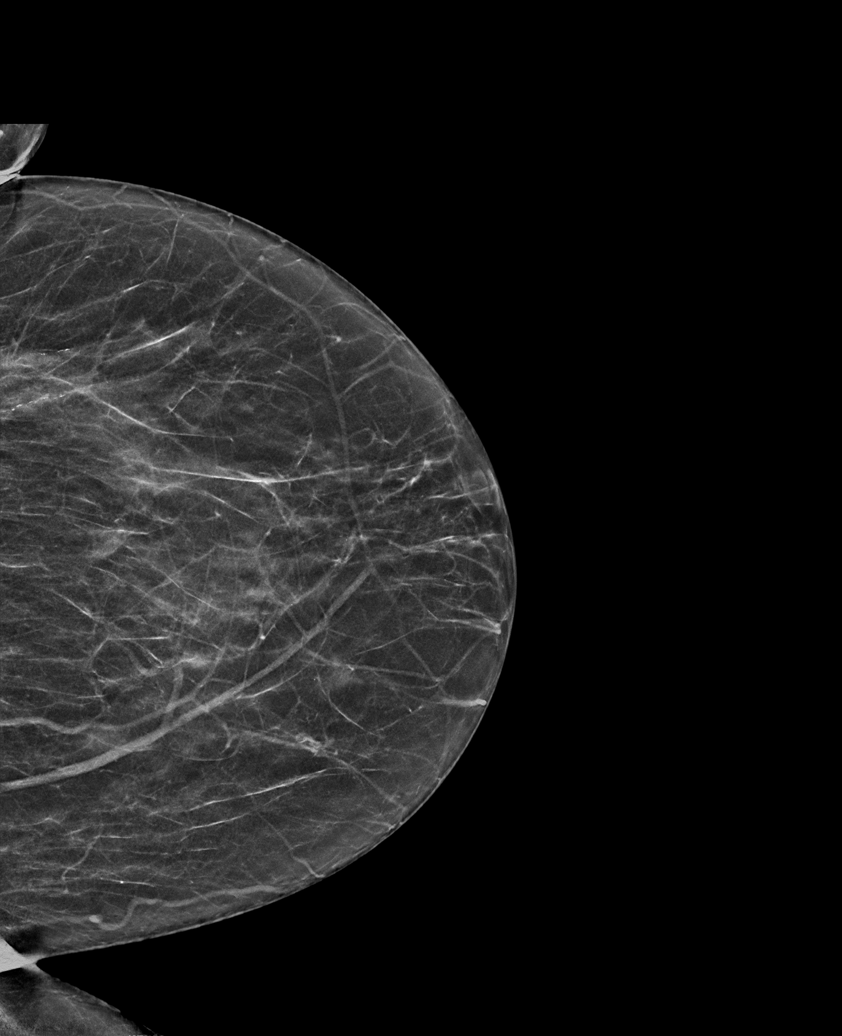

[R MLO synth-2D (1 of 2)]
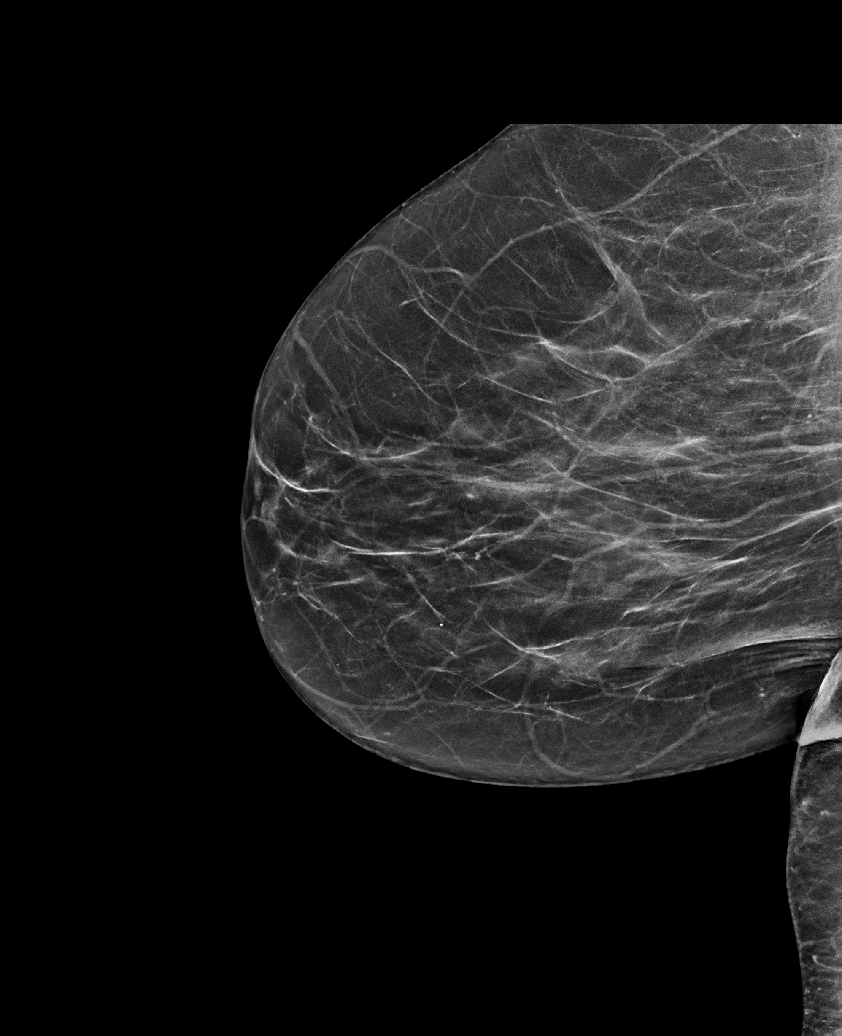

[R CC synth-2D]
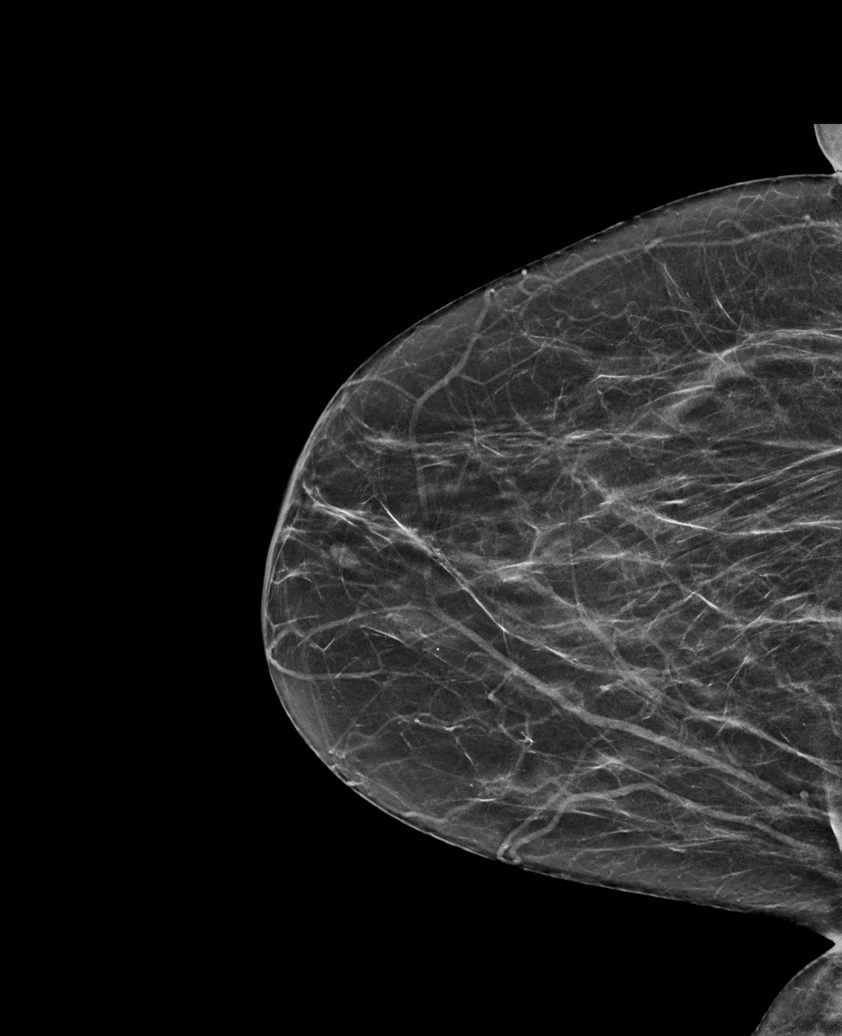

[R MLO synth-2D (2 of 2)]
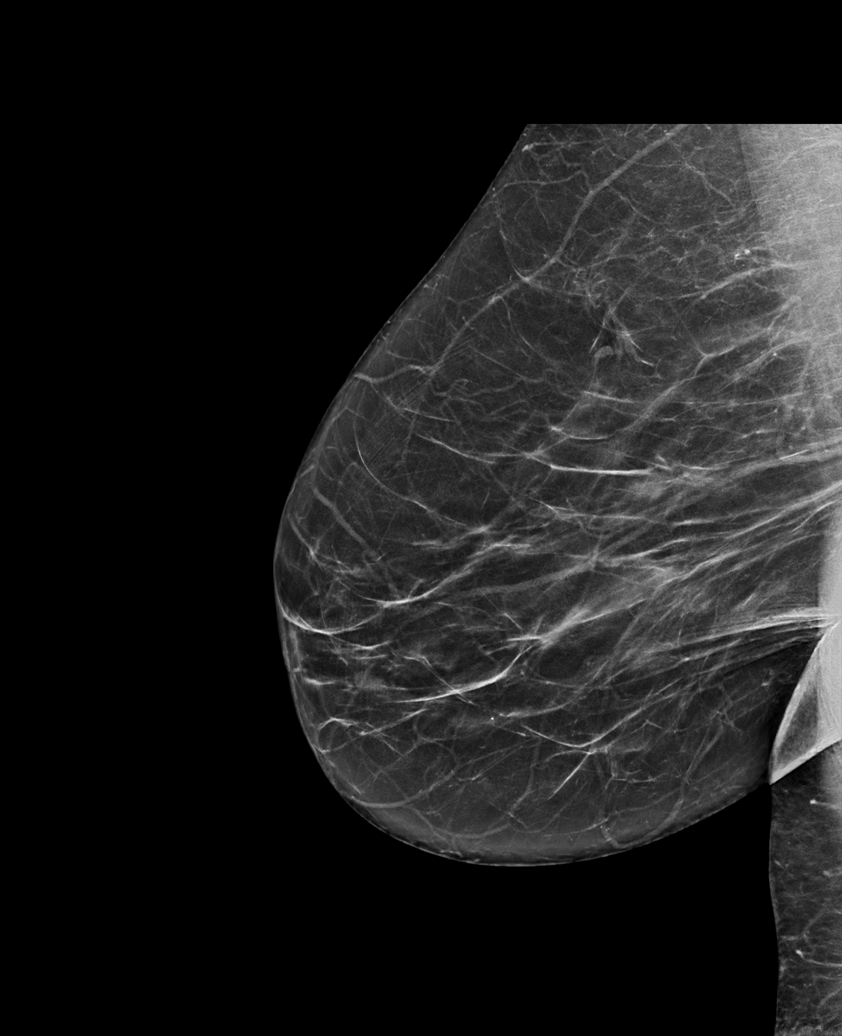

[L CC synth-2D (2 of 2)]
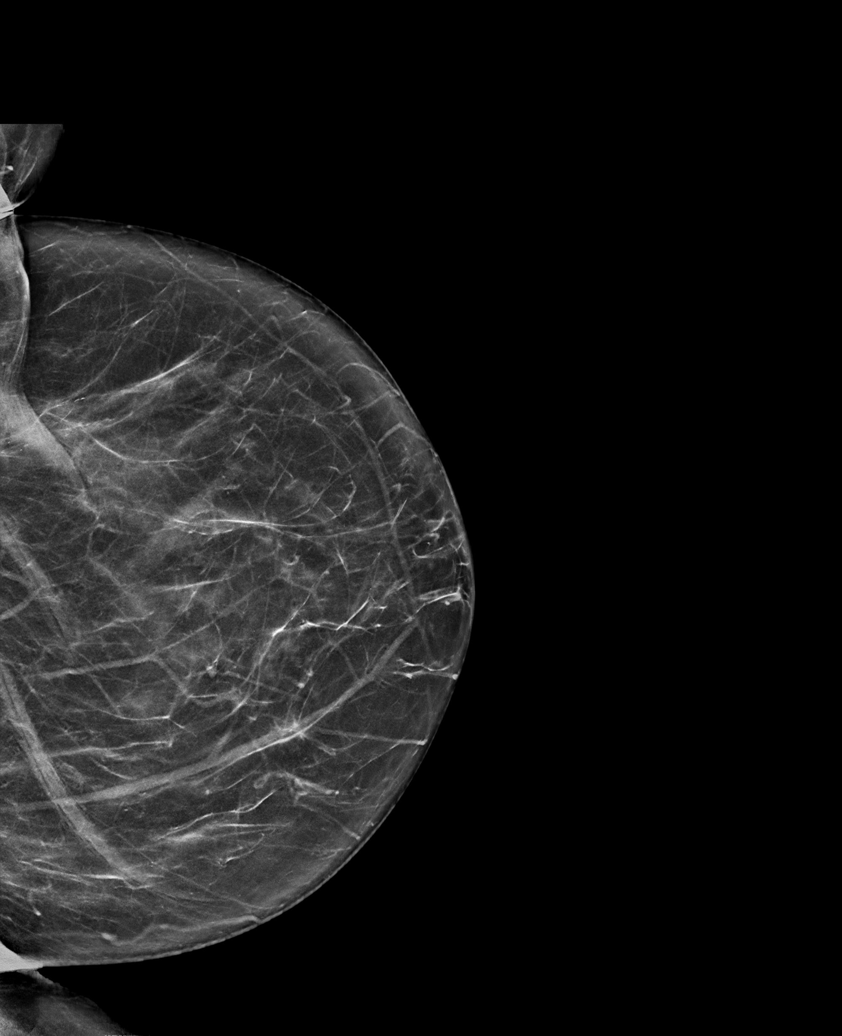

[L MLO synth-2D]
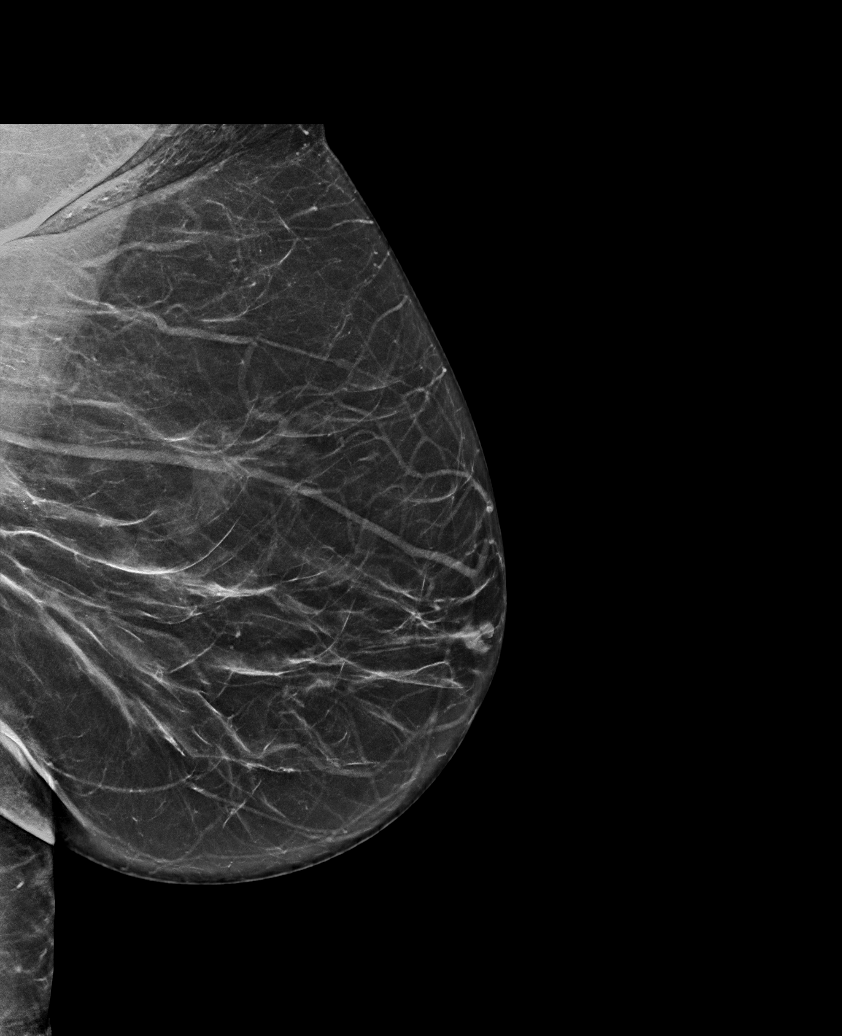

[6 of 36 positions shown; findings below may reference images not displayed]

ACR Breast Density Category b: There are scattered areas of
fibroglandular density.
FINDINGS: There are no findings suspicious for malignancy.
IMPRESSION: No mammographic evidence of malignancy. A result letter of this
screening mammogram will be mailed directly to the patient.

RECOMMENDATION:
Screening mammogram in one year. (Code:51-O-LD2)

BI-RADS CATEGORY  1: Negative.

## 2023-06-17 ENCOUNTER — Encounter (HOSPITAL_COMMUNITY): Admission: RE | Admit: 2023-06-17 | Payer: Medicare (Managed Care) | Source: Ambulatory Visit

## 2023-06-25 ENCOUNTER — Encounter: Payer: Self-pay | Admitting: Physical Medicine & Rehabilitation

## 2023-06-25 ENCOUNTER — Encounter
Payer: Medicare (Managed Care) | Attending: Physical Medicine & Rehabilitation | Admitting: Physical Medicine & Rehabilitation

## 2023-06-25 VITALS — BP 120/79 | HR 66 | Ht 64.0 in | Wt 205.0 lb

## 2023-06-25 DIAGNOSIS — M1712 Unilateral primary osteoarthritis, left knee: Secondary | ICD-10-CM | POA: Diagnosis present

## 2023-06-25 DIAGNOSIS — G8929 Other chronic pain: Secondary | ICD-10-CM | POA: Insufficient documentation

## 2023-06-25 DIAGNOSIS — M545 Low back pain, unspecified: Secondary | ICD-10-CM | POA: Diagnosis present

## 2023-06-25 HISTORY — DX: Other chronic pain: G89.29

## 2023-06-25 NOTE — Progress Notes (Signed)
Subjective:    Patient ID: Bridget [name], female    DOB: 01-Jun-1956, 67 y.o.   MRN: [id number] 67 yo female with hx of morbid obesity,fibromyalgia,  schizoaffective disorder and bilateral knee OA referred by PCP for above complaints.  Pt states pain has been present for ~77yrs, told it was arthritis, denies injury or fall.   Has occ numbness in the RIght big toe.  Has had right S1 transforaminal ESI in May 2023 which pt reports was not helpful for her buttocks pain.    03/03/2023- Bilateral Lumbar L3, L4  medial branch blocks and L 5 dorsal ramus injection under fluoroscopic guidance     HPI  67 year old female with history of schizoaffective disorder as well as chronic low back and chronic knee pain.  Patient returns today after having had a sacroiliac injection under fluoroscopic guidance performed 04/30/2023.  She states that she did not have any significant relief of pain.  Her postprocedure pain relief was only about 12.5%. She is scheduled for left total knee replacement.  She is getting preoperative assessments at this time.  Getting ready for Left TKR, had cardiology eval pre op  The patient states that her pain increases with walking bending sitting and standing.  Sleep is poor.       Pain Inventory Average Pain 8 Pain Right Now 4 My pain is intermittent, constant, tingling, and aching  In the last 24 hours, has pain interfered with the following? General activity 6 Relation with others 7 Enjoyment of life 7 What TIME of day is your pain at its worst? morning , daytime, evening, night, and varies Sleep (in general) Poor  Pain is worse with: walking, bending, sitting, inactivity, standing, and some activites Pain improves with: rest and medication Relief from Meds: 3  Family History  Problem Relation Age of Onset   Diabetes Mother    Prostate cancer Father    High blood pressure Sister    Colon cancer Neg Hx    Colon polyps Neg Hx    Rectal cancer Neg Hx     Stomach cancer Neg Hx    Social History   Socioeconomic History   Marital status: Married    Spouse name: Not on file   Number of children: Not on file   Years of education: Not on file   Highest education level: Not on file  Occupational History   Not on file  Tobacco Use   Smoking status: Never   Smokeless tobacco: Never  Vaping Use   Vaping status: Never Used  Substance and Sexual Activity   Alcohol use: No   Drug use: No   Sexual activity: Not Currently    Birth control/protection: Post-menopausal    Comment: Hysterectomy  Other Topics Concern   Not on file  Social History Narrative   Not on file   Social Determinants of Health   Financial Resource Strain: Not on file  Food Insecurity: Not on file  Transportation Needs: Not on file  Physical Activity: Not on file  Stress: Not on file  Social Connections: Not on file   Past Surgical History:  Procedure Laterality Date   ABDOMINAL HYSTERECTOMY     BREAST EXCISIONAL BIOPSY Right    BREAST EXCISIONAL BIOPSY Right    BREAST EXCISIONAL BIOPSY Right    BREAST EXCISIONAL BIOPSY Right    BUNIONECTOMY Bilateral    COLONOSCOPY  08/2013   HAND SURGERY Right    carpel tunnel surgery   INJECTION KNEE Bilateral  10/30/2021   Procedure: BILATERAL KNEE INJECTIONS;  Surgeon: Tarry Kos, MD;  Location: [facility];  Service: Orthopedics;  Laterality: Bilateral;   MULTIPLE TOOTH EXTRACTIONS     full dentures   SHOULDER ARTHROSCOPY WITH ROTATOR CUFF REPAIR AND SUBACROMIAL DECOMPRESSION Right 10/30/2021   Procedure: RIGHT SHOULDER ARTHROSCOPY WITH ROTATOR CUFF REPAIR AND SUBACROMIAL DECOMPRESSION, DEBRIDEMENT;  Surgeon: Tarry Kos, MD;  Location: [facility];  Service: Orthopedics;  Laterality: Right;   TRIGGER FINGER RELEASE Bilateral    thumbs   Past Surgical History:  Procedure Laterality Date   ABDOMINAL HYSTERECTOMY     BREAST EXCISIONAL BIOPSY Right    BREAST EXCISIONAL BIOPSY Right     BREAST EXCISIONAL BIOPSY Right    BREAST EXCISIONAL BIOPSY Right    BUNIONECTOMY Bilateral    COLONOSCOPY  08/2013   HAND SURGERY Right    carpel tunnel surgery   INJECTION KNEE Bilateral 10/30/2021   Procedure: BILATERAL KNEE INJECTIONS;  Surgeon: Tarry Kos, MD;  Location: [facility];  Service: Orthopedics;  Laterality: Bilateral;   MULTIPLE TOOTH EXTRACTIONS     full dentures   SHOULDER ARTHROSCOPY WITH ROTATOR CUFF REPAIR AND SUBACROMIAL DECOMPRESSION Right 10/30/2021   Procedure: RIGHT SHOULDER ARTHROSCOPY WITH ROTATOR CUFF REPAIR AND SUBACROMIAL DECOMPRESSION, DEBRIDEMENT;  Surgeon: Tarry Kos, MD;  Location:  SUR[facility]ERY CENTER;  Service: Orthopedics;  Laterality: Right;   TRIGGER FINGER RELEASE Bilateral    thumbs   Past Medical History:  Diagnosis Date   Anxiety    Back pain    low back - arthritis   Barrett's esophagus    Chronic headaches    Constipation    miralax daily   Depression    Dry mouth    biotene   Fibromyalgia    GERD (gastroesophageal reflux disease)    Heart murmur    Hyperlipemia    Insomnia    Myoclonus    patient states "no longer an issue"   Osteoarthritis    lower back, shoulders, hands   Schizoaffective disorder (HCC)    SVD (spontaneous vaginal delivery)    x 3   Vertigo    Wears dentures    full   Ht 5\' 4"  (1.626 m)   Wt 205 lb (93 kg)   BMI 35.19 kg/m   Opioid Risk Score:   Fall Risk Score:  `1  Depression screen [facility] 2/9     06/25/2023   10:57 AM 04/30/2023   11:14 AM 01/30/2023   12:44 PM  Depression screen PHQ 2/9  Decreased Interest 1 1 2   Down, Depressed, Hopeless 1 1 1   PHQ - 2 Score 2 2 3   Altered sleeping   3  Tired, decreased energy   1  Change in appetite   0  Feeling bad or failure about yourself    0  Trouble concentrating   2  Moving slowly or fidgety/restless   1  Suicidal thoughts   0  PHQ-9 Score   10     Review of Systems  Musculoskeletal:  Positive for back pain.        Abdominal b/l leg butock b/l shoulder pain  All other systems reviewed and are negative.      Objective:   Physical Exam Vitals and nursing note reviewed.  Constitutional:      Appearance: She is obese.  HENT:     Head: Normocephalic and atraumatic.  Eyes:     Extraocular Movements: Extraocular movements  intact.     Conjunctiva/sclera: Conjunctivae normal.     Pupils: Pupils are equal, round, and reactive to light.  Musculoskeletal:     Comments: No tenderness to palpation lumbar spine.  The patient has 75% range with forward flexion but only 25% of extension. She indicates that the pain with extension is worse than pain with flexion.  It is mainly in the lower lumbar area.  Tenderness over the greater trochanters of the hip or over the gluteal region There is no no evidence of knee effusion bilaterally with good range of motion she has mild left groin pain with hip internal/external rotation on the left side.  Skin:    General: Skin is warm and dry.  Neurological:     General: No focal deficit present.     Mental Status: She is alert and oriented to person, place, and time.     Comments: Motor strength is 5/5 bilateral hip flexor knee extensor ankle dorsiflexor Negative straight leg raising bilaterally Sensation normal in lower extremities   Psychiatric:        Mood and Affect: Mood normal.        Behavior: Behavior normal.           Assessment & Plan:  #1.  Chronic low back pain she has not had any significant relief with diagnostic injections of the sacroiliac joints or of the lower lumbar facet innervation.  Her exam is more consistent with lumbar spondylosis.  She does have a gait abnormality related to the antalgic gait from her left knee osteoarthritis.  We discussed that she will be undergoing surgery soon, she will have postoperative pain medications.  She will need to recover and if her gait is less antalgic this may indeed help with some of her back pain.  She is  to call me if her pain does not improve after she has adequate postoperative recovery. 2.  Left groin pain with internal/external rotation possible OA however x-rays did not show any significant degenerative changes.  If this continues to be an issue or worsens may consider MRI of the left hip.  Once again would wait for postoperative recovery after the left total knee replacement.  Given her age as well as comorbid conditions with her psychiatric medications, would avoid long-term narcotic analgesics due to fall risk.  She of course will need these postoperatively and Dr. Roda Shutters will prescribe

## 2023-06-25 NOTE — Patient Instructions (Signed)
Call me if back pain does not improve after you recover from knee surgery

## 2023-06-29 ENCOUNTER — Inpatient Hospital Stay (HOSPITAL_COMMUNITY): Admit: 2023-06-29 | Payer: Medicare (Managed Care) | Admitting: Orthopaedic Surgery

## 2023-06-29 DIAGNOSIS — M1712 Unilateral primary osteoarthritis, left knee: Secondary | ICD-10-CM

## 2023-06-29 SURGERY — ARTHROPLASTY, KNEE, TOTAL
Anesthesia: Spinal | Site: Knee | Laterality: Left

## 2023-07-07 ENCOUNTER — Ambulatory Visit
Admission: RE | Admit: 2023-07-07 | Discharge: 2023-07-07 | Disposition: A | Discharge status: Home / Self Care | Payer: Medicare (Managed Care) | Source: Ambulatory Visit | Attending: Family Medicine | Admitting: Family Medicine

## 2023-07-07 DIAGNOSIS — Z0001 Encounter for general adult medical examination with abnormal findings: Secondary | ICD-10-CM

## 2023-07-10 ENCOUNTER — Encounter: Payer: Medicare (Managed Care) | Admitting: Physician Assistant

## 2023-08-10 ENCOUNTER — Other Ambulatory Visit: Payer: Self-pay

## 2023-08-10 DIAGNOSIS — Z972 Presence of dental prosthetic device (complete) (partial): Secondary | ICD-10-CM | POA: Insufficient documentation

## 2023-08-10 DIAGNOSIS — F419 Anxiety disorder, unspecified: Secondary | ICD-10-CM | POA: Insufficient documentation

## 2023-08-10 DIAGNOSIS — K5900 Constipation, unspecified: Secondary | ICD-10-CM | POA: Insufficient documentation

## 2023-08-10 DIAGNOSIS — R519 Headache, unspecified: Secondary | ICD-10-CM | POA: Insufficient documentation

## 2023-08-10 DIAGNOSIS — K219 Gastro-esophageal reflux disease without esophagitis: Secondary | ICD-10-CM | POA: Insufficient documentation

## 2023-08-10 DIAGNOSIS — R011 Cardiac murmur, unspecified: Secondary | ICD-10-CM | POA: Insufficient documentation

## 2023-08-10 DIAGNOSIS — M797 Fibromyalgia: Secondary | ICD-10-CM | POA: Insufficient documentation

## 2023-08-10 DIAGNOSIS — K2270 Barrett's esophagus without dysplasia: Secondary | ICD-10-CM | POA: Insufficient documentation

## 2023-08-10 DIAGNOSIS — G4700 Insomnia, unspecified: Secondary | ICD-10-CM | POA: Insufficient documentation

## 2023-08-10 DIAGNOSIS — R42 Dizziness and giddiness: Secondary | ICD-10-CM | POA: Insufficient documentation

## 2023-08-10 DIAGNOSIS — M1990 Unspecified osteoarthritis, unspecified site: Secondary | ICD-10-CM | POA: Insufficient documentation

## 2023-08-10 DIAGNOSIS — R682 Dry mouth, unspecified: Secondary | ICD-10-CM | POA: Insufficient documentation

## 2023-08-10 DIAGNOSIS — M549 Dorsalgia, unspecified: Secondary | ICD-10-CM | POA: Insufficient documentation

## 2023-08-10 DIAGNOSIS — G253 Myoclonus: Secondary | ICD-10-CM | POA: Insufficient documentation

## 2023-08-10 DIAGNOSIS — E785 Hyperlipidemia, unspecified: Secondary | ICD-10-CM | POA: Insufficient documentation

## 2023-08-10 DIAGNOSIS — F259 Schizoaffective disorder, unspecified: Secondary | ICD-10-CM | POA: Insufficient documentation

## 2023-08-10 DIAGNOSIS — G8929 Other chronic pain: Secondary | ICD-10-CM | POA: Insufficient documentation

## 2023-08-10 DIAGNOSIS — F32A Depression, unspecified: Secondary | ICD-10-CM | POA: Insufficient documentation

## 2023-08-12 ENCOUNTER — Ambulatory Visit: Payer: Medicare (Managed Care) | Admitting: Cardiology

## 2023-08-13 ENCOUNTER — Ambulatory Visit: Payer: Medicare (Managed Care) | Admitting: Cardiology

## 2023-09-03 ENCOUNTER — Encounter: Payer: Self-pay | Admitting: Cardiology

## 2023-09-03 ENCOUNTER — Ambulatory Visit: Payer: Medicare (Managed Care) | Attending: Cardiology | Admitting: Cardiology

## 2023-09-03 VITALS — BP 112/68 | HR 69 | Ht 64.0 in | Wt 213.0 lb

## 2023-09-03 DIAGNOSIS — R011 Cardiac murmur, unspecified: Secondary | ICD-10-CM

## 2023-09-03 DIAGNOSIS — I209 Angina pectoris, unspecified: Secondary | ICD-10-CM

## 2023-09-03 DIAGNOSIS — E785 Hyperlipidemia, unspecified: Secondary | ICD-10-CM

## 2023-09-03 DIAGNOSIS — I259 Chronic ischemic heart disease, unspecified: Secondary | ICD-10-CM

## 2023-09-03 HISTORY — DX: Angina pectoris, unspecified: I20.9

## 2023-09-03 HISTORY — DX: Cardiac murmur, unspecified: R01.1

## 2023-09-03 MED ORDER — METOPROLOL TARTRATE 50 MG PO TABS
ORAL_TABLET | ORAL | 0 refills | Status: DC
Start: 2023-09-03 — End: 2024-02-08

## 2023-09-03 NOTE — Progress Notes (Signed)
Cardiology Office Note:    Date:  09/03/2023   ID:  Bridget [name], DOB [birth year], MRN [id number]  PCP:  Kermit Balo, DO  Cardiologist:  Garwin Brothers, MD   Referring MD: Kermit Balo, DO    ASSESSMENT:    1. Hyperlipidemia, unspecified hyperlipidemia type   2. Morbid obesity (HCC)   3. Angina pectoris (HCC)   4. Cardiac murmur    PLAN:    In order of problems listed above:  Angina pectoris: Patient's symptoms are significant for angina.  Following recommendations were given to her.  Sublingual nitroglycerin prescription was sent, its protocol and 911 protocol explained and the patient vocalized understanding questions were answered to the patient's satisfaction.  Multiple modalities of evaluation invasive and noninvasive we discussed pros and cons were detailed and she prefers CT coronary angiography.  I will schedule her for this. Cardiac murmur: Echocardiogram will be done to assess murmur heard on auscultation. Mixed dyslipidemia: On lipid-lowering medications followed by primary care.  Diet emphasized. Obesity: Weight reduction stressed and she promises to do better.  Risks of obesity revisited.  Lifestyle modifications stressed. Patient will be seen in follow-up appointment in 6 months or earlier if the patient has any concerns.    Medication Adjustments/Labs and Tests Ordered: Current medicines are reviewed at length with the patient today.  Concerns regarding medicines are outlined above.  Orders Placed This Encounter  Procedures   EKG 12-Lead   No orders of the defined types were placed in this encounter.    History of Present Illness:    Bridget [name] is a 67 y.o. female who is being seen today for the evaluation of chest pain at the request of Reed, Tiffany L, DO.  Patient is a pleasant 67 year old female.  She has past medical history of mixed dyslipidemia.  She mentions to me that she has been having chest tightness at times which may or may  not be related to exertion.  This there is some radiation to the neck.  She is concerned about it.  No orthopnea PND.  No history of smoking diabetes mellitus or hypertension.  At the time of my evaluation, the patient is alert awake oriented and in no distress.  Past Medical History:  Diagnosis Date   Acute sinusitis 02/06/2016   Age-related nuclear cataract of both eyes 01/26/2017   Allergic rhinitis due to pollen 02/07/2016   Anxiety    Auditory hallucinations 07/08/2016   Back pain    low back - arthritis   Barrett's esophagus    Bilateral presbyopia 01/26/2017   Brachial neuritis or radiculitis 02/06/2016   Bursitis of left hip 02/06/2016   Cervical neuritis 02/06/2016   Chronic bilateral low back pain without sciatica 06/25/2023   Chronic headaches    Complete tear of right rotator cuff 10/30/2021   Constipation    miralax daily   Corneal dystrophy, anterior 01/26/2017   Cortical senile cataract of both eyes 01/26/2017   DDD (degenerative disc disease), cervical 02/07/2016   DDD (degenerative disc disease), lumbosacral 01/03/2014   Depression    Dermatitis, eczematoid 02/06/2016   Disorder of bursae and tendons in shoulder region 02/06/2016   Dry mouth    biotene   Dysphagia 11/02/2022   Enthesopathy of ankle and tarsus 02/06/2016   Fainting 02/06/2016   Fibromyalgia    GAD (generalized anxiety disorder) 01/24/2020   Generalized osteoarthrosis 09/15/2013   GERD (gastroesophageal reflux disease)    Glossitis 02/07/2016   Heart murmur  Hyperlipemia    Insomnia    Joint pain, knee 02/07/2016   Menopausal disorder 02/07/2016   Migraine headache 02/07/2016   Morbid obesity (HCC) 02/07/2016   Myalgia 02/07/2016   Myoclonus    patient states "no longer an issue"   Myositis 02/07/2016   Obesity 02/07/2016   Osteoarthritis    lower back, shoulders, hands   Periodic limb movement disorder 02/07/2016   Plantar fasciitis 02/07/2016   Primary osteoarthritis of left  knee 01/28/2022   Primary osteoarthritis of right knee 01/28/2022   Referred otalgia 02/07/2016   Restless leg syndrome 02/07/2016   Schizoaffective disorder (HCC)    Schizoaffective disorder, bipolar type (HCC) 02/04/2018   Shoulder pain 02/07/2016   SVD (spontaneous vaginal delivery)    x 3   Toxic effect of secondhand tobacco smoke 02/07/2016   Type II diabetes mellitus (HCC) 04/07/2014   Vaginal atrophy 02/07/2016   Vertigo    Visual hallucination 07/08/2016   Vulvar atrophy 02/07/2016   Wears dentures    full    Past Surgical History:  Procedure Laterality Date   ABDOMINAL HYSTERECTOMY     BREAST EXCISIONAL BIOPSY Right    BREAST EXCISIONAL BIOPSY Right    BREAST EXCISIONAL BIOPSY Right    BREAST EXCISIONAL BIOPSY Right    BUNIONECTOMY Bilateral    COLONOSCOPY  08/2013   HAND SURGERY Right    carpel tunnel surgery   INJECTION KNEE Bilateral 10/30/2021   Procedure: BILATERAL KNEE INJECTIONS;  Surgeon: Tarry Kos, MD;  Location: [facility];  Service: Orthopedics;  Laterality: Bilateral;   MULTIPLE TOOTH EXTRACTIONS     full dentures   SHOULDER ARTHROSCOPY WITH ROTATOR CUFF REPAIR AND SUBACROMIAL DECOMPRESSION Right 10/30/2021   Procedure: RIGHT SHOULDER ARTHROSCOPY WITH ROTATOR CUFF REPAIR AND SUBACROMIAL DECOMPRESSION, DEBRIDEMENT;  Surgeon: Tarry Kos, MD;  Location: [facility];  Service: Orthopedics;  Laterality: Right;   TRIGGER FINGER RELEASE Bilateral    thumbs    Current Medications: Current Meds  Medication Sig   antiseptic oral rinse (BIOTENE) LIQD 15 mLs by Mouth Rinse route 6 (six) times daily.   ARIPiprazole (ABILIFY) 20 MG tablet Take 20 mg by mouth at bedtime.   ARIPiprazole ER (ABILIFY MAINTENA) 400 MG PRSY prefilled syringe Inject 400 mg into the muscle every 28 (twenty-eight) days.   aspirin EC 81 MG tablet Take 1 tablet (81 mg total) by mouth 2 (two) times daily. To be taken after surgery to prevent blood clots    CEQUA 0.09 % SOLN Place 1 drop into both eyes every 12 (twelve) hours.   dicyclomine (BENTYL) 20 MG tablet Take 1 tablet (20 mg total) by mouth 2 (two) times daily.   Doxepin HCl 6 MG TABS Take 1 tablet (6 mg total) by mouth at bedtime.   DULoxetine (CYMBALTA) 60 MG capsule Take 120 mg by mouth in the morning.   hydrOXYzine (VISTARIL) 25 MG capsule Take 1 capsule (25 mg total) by mouth 3 (three) times daily as needed for anxiety.   latanoprost (XALATAN) 0.005 % ophthalmic solution Place 1 drop into both eyes at bedtime.   lidocaine (LIDODERM) 5 % Place 1 patch onto the skin daily as needed (for lower back pain- Remove & Discard patch within 12 hours or as directed by MD).   pantoprazole (PROTONIX) 40 MG tablet Take 40 mg by mouth daily before breakfast.   polyethylene glycol (MIRALAX / GLYCOLAX) packet Take 17 g by mouth daily.   rosuvastatin (CRESTOR) 20 MG  tablet Take 20 mg by mouth daily.   TYLENOL 500 MG tablet Take 1,000 mg by mouth 2 (two) times daily.     Allergies:   Flexeril [cyclobenzaprine], Amitriptyline, and Penicillins   Social History   Socioeconomic History   Marital status: Married    Spouse name: Not on file   Number of children: Not on file   Years of education: Not on file   Highest education level: Not on file  Occupational History   Not on file  Tobacco Use   Smoking status: Never   Smokeless tobacco: Never  Vaping Use   Vaping status: Never Used  Substance and Sexual Activity   Alcohol use: No   Drug use: No   Sexual activity: Not Currently    Birth control/protection: Post-menopausal    Comment: Hysterectomy  Other Topics Concern   Not on file  Social History Narrative   Not on file   Social Determinants of Health   Financial Resource Strain: Not on file  Food Insecurity: Not on file  Transportation Needs: Not on file  Physical Activity: Not on file  Stress: Not on file  Social Connections: Not on file     Family History: The patient's  family history includes Diabetes in her mother; High blood pressure in her sister; Prostate cancer in her father. There is no history of Colon cancer, Colon polyps, Rectal cancer, or Stomach cancer.  ROS:   Please see the history of present illness.    All other systems reviewed and are negative.  EKGs/Labs/Other Studies Reviewed:    The following studies were reviewed today:  EKG Interpretation Date/Time:  Thursday September 03 2023 13:25:47 EDT Ventricular Rate:  69 PR Interval:  150 QRS Duration:  80 QT Interval:  366 QTC Calculation: 392 R Axis:   25  Text Interpretation: Normal sinus rhythm Septal infarct , age undetermined When compared with ECG of 01-May-2023 22:00, PREVIOUS ECG IS PRESENT Confirmed by Belva Crome [phone]) on 09/03/2023 1:35:56 PM     Recent Labs: 11/06/2022: ALT 12 05/01/2023: BUN 10; Creatinine, Ser 0.86; Hemoglobin 11.3; Platelets 319; Potassium 3.8; Sodium 138  Recent Lipid Panel No results found for: "CHOL", "TRIG", "HDL", "CHOLHDL", "VLDL", "LDLCALC", "LDLDIRECT"  Physical Exam:    VS:  BP 112/68   Pulse 69   Ht 5\' 4"  (1.626 m)   Wt 213 lb (96.6 kg)   SpO2 98%   BMI 36.56 kg/m     Wt Readings from Last 3 Encounters:  09/03/23 213 lb (96.6 kg)  06/25/23 205 lb (93 kg)  05/01/23 217 lb (98.4 kg)     GEN: Patient is in no acute distress HEENT: Normal NECK: No JVD; No carotid bruits LYMPHATICS: No lymphadenopathy CARDIAC: S1 S2 regular, 2/6 systolic murmur at the apex. RESPIRATORY:  Clear to auscultation without rales, wheezing or rhonchi  ABDOMEN: Soft, non-tender, non-distended MUSCULOSKELETAL:  No edema; No deformity  SKIN: Warm and dry NEUROLOGIC:  Alert and oriented x 3 PSYCHIATRIC:  Normal affect    Signed, Garwin Brothers, MD  09/03/2023 1:45 PM    [facility]

## 2023-09-03 NOTE — Patient Instructions (Signed)
Medication Instructions:  Your physician has recommended you make the following change in your medication:   Use nitroglycerin 1 tablet placed under the tongue at the first sign of chest pain or an angina attack. 1 tablet may be used every 5 minutes as needed, for up to 15 minutes. Do not take more than 3 tablets in 15 minutes. If pain persist call 911 or go to the nearest ED.   *If you need a refill on your cardiac medications before your next appointment, please call your pharmacy*   Lab Work: Your physician recommends that you have a BMP today in the office.   If you have labs (blood work) drawn today and your tests are completely normal, you will receive your results only by: MyChart Message (if you have MyChart) OR A paper copy in the mail If you have any lab test that is abnormal or we need to change your treatment, we will call you to review the results.   Testing/Procedures:   Your cardiac CT will be scheduled at one of the below locations:   [facility] [address] [place], [place] [address] [phone]  If scheduled at [facility], please arrive at the [facility] Entrance (Entrance C2) of [facility] 30 minutes prior to test start time. You can use the FREE valet parking offered at entrance C (encouraged to control the heart rate for the test)  Proceed to the [facility] Radiology Department (first floor) to check-in and test prep.  All radiology patients and guests should use entrance C2 at [facility], accessed from [facility], even though the hospital's physical address listed is [address]     Please follow these instructions carefully (unless otherwise directed):  On the Night Before the Test: Be sure to Drink plenty of water. Do not consume any caffeinated/decaffeinated beverages or chocolate 12 hours prior to your test. Do not take any antihistamines 12 hours prior to your  test.  On the Day of the Test: Drink plenty of water until 1 hour prior to the test. Do not eat any food 1 hour prior to test. You may take your regular medications prior to the test.  Take metoprolol (Lopressor) two hours prior to test. This will be a one time dose. FEMALES- please wear underwire-free bra if available, avoid dresses & tight clothing       After the Test: Drink plenty of water. After receiving IV contrast, you may experience a mild flushed feeling. This is normal. On occasion, you may experience a mild rash up to 24 hours after the test. This is not dangerous. If this occurs, you can take Benadryl 25 mg and increase your fluid intake. If you experience trouble breathing, this can be serious. If it is severe call 911 IMMEDIATELY. If it is mild, please call our office. If you take any of these medications: Glipizide/Metformin, Avandament, Glucavance, please do not take 48 hours after completing test unless otherwise instructed.  We will call to schedule your test 2-4 weeks out understanding that some insurance companies will need an authorization prior to the service being performed.   For non-scheduling related questions, please contact the cardiac imaging nurse navigator should you have any questions/concerns: Rockwell Alexandria, Cardiac Imaging Nurse Navigator Larey Brick, Cardiac Imaging Nurse Navigator [facility] Direct Office Dial: [phone]   For scheduling needs, including cancellations and rescheduling, please call [place], [phone].   Your physician has  requested that you have an echocardiogram. Echocardiography is a painless test that uses sound waves to create images of your heart. It provides your doctor with information about the size and shape of your heart and how well your heart's chambers and valves are working. This procedure takes approximately one hour. There are no restrictions for this procedure. Please do NOT wear  cologne, perfume, aftershave, or lotions (deodorant is allowed). Please arrive 15 minutes prior to your appointment time.  Your next appointment:   9 month(s)  The format for your next appointment:   In Person  Provider:   Belva Crome, MD   Other Instructions Cardiac CT Angiogram A cardiac CT angiogram is a procedure to look at the heart and the area around the heart. It may be done to help find the cause of chest pains or other symptoms of heart disease. During this procedure, a substance called contrast dye is injected into the blood vessels in the area to be checked. A large X-ray machine, called a CT scanner, then takes detailed pictures of the heart and the surrounding area. The procedure is also sometimes called a coronary CT angiogram, coronary artery scanning, or CTA. A cardiac CT angiogram allows the health care provider to see how well blood is flowing to and from the heart. The health care provider will be able to see if there are any problems, such as: Blockage or narrowing of the coronary arteries in the heart. Fluid around the heart. Signs of weakness or disease in the muscles, valves, and tissues of the heart. Tell a health care provider about: Any allergies you have. This is especially important if you have had a previous allergic reaction to contrast dye. All medicines you are taking, including vitamins, herbs, eye drops, creams, and over-the-counter medicines. Any blood disorders you have. Any surgeries you have had. Any medical conditions you have. Whether you are pregnant or may be pregnant. Any anxiety disorders, chronic pain, or other conditions you have that may increase your stress or prevent you from lying still. What are the risks? Generally, this is a safe procedure. However, problems may occur, including: Bleeding. Infection. Allergic reactions to medicines or dyes. Damage to other structures or organs. Kidney damage from the contrast dye that is  used. Increased risk of cancer from radiation exposure. This risk is low. Talk with your health care provider about: The risks and benefits of testing. How you can receive the lowest dose of radiation. What happens before the procedure? Wear comfortable clothing and remove any jewelry, glasses, dentures, and hearing aids. Follow instructions from your health care provider about eating and drinking. This may include: For 12 hours before the procedure -- avoid caffeine. This includes tea, coffee, soda, energy drinks, and diet pills. Drink plenty of water or other fluids that do not have caffeine in them. Being well hydrated can prevent complications. For 4-6 hours before the procedure -- stop eating and drinking. The contrast dye can cause nausea, but this is less likely if your stomach is empty. Ask your health care provider about changing or stopping your regular medicines. This is especially important if you are taking diabetes medicines, blood thinners, or medicines to treat problems with erections (erectile dysfunction). What happens during the procedure?  Hair on your chest may need to be removed so that small sticky patches called electrodes can be placed on your chest. These will transmit information that helps to monitor your heart during the procedure. An IV will be inserted into  one of your veins. You might be given a medicine to control your heart rate during the procedure. This will help to ensure that good images are obtained. You will be asked to lie on an exam table. This table will slide in and out of the CT machine during the procedure. Contrast dye will be injected into the IV. You might feel warm, or you may get a metallic taste in your mouth. You will be given a medicine called nitroglycerin. This will relax or dilate the arteries in your heart. The table that you are lying on will move into the CT machine tunnel for the scan. The person running the machine will give you  instructions while the scans are being done. You may be asked to: Keep your arms above your head. Hold your breath. Stay very still, even if the table is moving. When the scanning is complete, you will be moved out of the machine. The IV will be removed. The procedure may vary among health care providers and hospitals. What can I expect after the procedure? After your procedure, it is common to have: A metallic taste in your mouth from the contrast dye. A feeling of warmth. A headache from the nitroglycerin. Follow these instructions at home: Take over-the-counter and prescription medicines only as told by your health care provider. If you are told, drink enough fluid to keep your urine pale yellow. This will help to flush the contrast dye out of your body. Most people can return to their normal activities right after the procedure. Ask your health care provider what activities are safe for you. It is up to you to get the results of your procedure. Ask your health care provider, or the department that is doing the procedure, when your results will be ready. Keep all follow-up visits as told by your health care provider. This is important. Contact a health care provider if: You have any symptoms of allergy to the contrast dye. These include: Shortness of breath. Rash or hives. A racing heartbeat. Summary A cardiac CT angiogram is a procedure to look at the heart and the area around the heart. It may be done to help find the cause of chest pains or other symptoms of heart disease. During this procedure, a large X-ray machine, called a CT scanner, takes detailed pictures of the heart and the surrounding area after a contrast dye has been injected into blood vessels in the area. Ask your health care provider about changing or stopping your regular medicines before the procedure. This is especially important if you are taking diabetes medicines, blood thinners, or medicines to treat erectile  dysfunction. If you are told, drink enough fluid to keep your urine pale yellow. This will help to flush the contrast dye out of your body. This information is not intended to replace advice given to you by your health care provider. Make sure you discuss any questions you have with your health care provider. Document Revised: 07/27/2019 Document Reviewed: 07/27/2019 Elsevier Patient Education  [employer].  Echocardiogram An echocardiogram is a test that uses sound waves to make images of your heart. This way of making images is often called ultrasound. The images from this test can help find out many things about your heart, including: The size and shape of your heart. The strength of your heart muscle and how well it's working. The size, thickness, and movement of your heart's walls. How your heart valves are working. Problems such as: A tumor or  a growth from an infection around the heart valves. Areas of heart muscle that aren't working well because of poor blood flow or injury from a heart attack. An aneurysm. This is a weak or damaged part of an artery wall. An artery is a blood vessel. Tell a health care provider about: Any allergies you have. All medicines you're taking, including vitamins, herbs, eye drops, creams, and over-the-counter medicines. Any bleeding problems you have. Any surgeries you've had. Any medical problems you have. Whether you're pregnant or may be pregnant. What are the risks? Your health care provider will talk with you about risks. These may include an allergic reaction to IV dye that may be used during the test. What happens before the test? You don't need to do anything to get ready for this test. You may eat and drink normally. What happens during the test?  You'll take off your clothes from the waist up and put on a hospital gown. Sticky patches called electrodes may be placed on your chest. These will be connected to a machine that monitors  your heart rate and rhythm. You'll lie down on a table for the exam. A wand covered in gel will be moved over your chest. Sound waves from the wand will go to your heart and bounce back--or "echo" back. The sound waves will go to a computer that uses them to make images of your heart. The images can be viewed on a monitor. The images will also be recorded on the computer so your provider can look at them later. You may be asked to change positions or hold your breath for a short time. This makes it easier to get different views or better views of your heart. In some cases, you may be given a dye through an IV. The IV is put into one of your veins. This dye can make the areas of your heart easier to see. The procedure may vary among providers and hospitals. What can I expect after the test? You may return to your normal diet, activities, and medicines unless your provider tells you not to. If an IV was placed for the test, it will be removed. It's up to you to get the results of your test. Ask your provider, or the department that's doing the test, when your results will be ready. This information is not intended to replace advice given to you by your health care provider. Make sure you discuss any questions you have with your health care provider. Document Revised: 01/30/2023 Document Reviewed: 01/30/2023 Elsevier Patient Education  2024 [employer].

## 2023-09-16 ENCOUNTER — Telehealth (HOSPITAL_COMMUNITY): Payer: Self-pay | Admitting: *Deleted

## 2023-09-16 NOTE — Telephone Encounter (Signed)
Reaching out to patient to offer assistance regarding upcoming cardiac imaging study; pt verbalizes understanding of appt date/time, parking situation and where to check in, pre-test NPO status and medications ordered, and verified current allergies; name and call back number provided for further questions should they arise Hayley Sharpe RN Navigator Cardiac Imaging [facility] [phone] office [phone] cell  

## 2023-09-17 ENCOUNTER — Ambulatory Visit (HOSPITAL_COMMUNITY)
Admission: RE | Admit: 2023-09-17 | Discharge: 2023-09-17 | Disposition: A | Discharge status: Home / Self Care | Payer: Medicare (Managed Care) | Source: Ambulatory Visit | Attending: Cardiology | Admitting: Cardiology

## 2023-09-17 DIAGNOSIS — I259 Chronic ischemic heart disease, unspecified: Secondary | ICD-10-CM | POA: Diagnosis present

## 2023-09-17 DIAGNOSIS — I209 Angina pectoris, unspecified: Secondary | ICD-10-CM

## 2023-09-17 MED ORDER — IOHEXOL 350 MG/ML SOLN
100.0000 mL | Freq: Once | INTRAVENOUS | Status: AC | PRN
  Administered 2023-09-17: 100 mL via INTRAVENOUS

## 2023-09-17 MED ORDER — NITROGLYCERIN 0.4 MG SL SUBL
0.8000 mg | SUBLINGUAL_TABLET | Freq: Once | SUBLINGUAL | Status: AC
  Administered 2023-09-17: 0.8 mg via SUBLINGUAL

## 2023-09-17 MED ORDER — NITROGLYCERIN 0.4 MG SL SUBL
SUBLINGUAL_TABLET | SUBLINGUAL | Status: AC
  Filled 2023-09-17: qty 2

## 2023-10-06 ENCOUNTER — Ambulatory Visit (HOSPITAL_BASED_OUTPATIENT_CLINIC_OR_DEPARTMENT_OTHER)
Admission: RE | Admit: 2023-10-06 | Discharge: 2023-10-06 | Disposition: A | Discharge status: Home / Self Care | Payer: Medicare (Managed Care) | Source: Ambulatory Visit | Attending: Cardiology | Admitting: Cardiology

## 2023-10-06 DIAGNOSIS — R011 Cardiac murmur, unspecified: Secondary | ICD-10-CM | POA: Diagnosis present

## 2023-10-06 DIAGNOSIS — I209 Angina pectoris, unspecified: Secondary | ICD-10-CM | POA: Diagnosis present

## 2023-10-07 LAB — ECHOCARDIOGRAM COMPLETE
AR max vel: 2.67 cm2
AV Area VTI: 2.55 cm2
AV Area mean vel: 2.48 cm2
AV Mean grad: 6 mm[Hg]
AV Peak grad: 9.9 mm[Hg]
Ao pk vel: 1.58 m/s
Area-P 1/2: 2.93 cm2
Calc EF: 60.4 %
MV M vel: 4.11 m/s
MV Peak grad: 67.6 mm[Hg]
S' Lateral: 2.6 cm
Single Plane A2C EF: 59.3 %
Single Plane A4C EF: 59.4 %

## 2023-10-08 ENCOUNTER — Telehealth: Payer: Self-pay

## 2023-10-08 NOTE — Telephone Encounter (Signed)
Patient called.  Per DPR detailed message left on VM with lab results as reviewed by Dr. Tomie China.  Echo  Grade I diastolic dysfunction (impaired relaxation) this can come from normal aging and/or high blood pressure. Heart healthy diet, maintaining a normal BP and exercising can help this from getting worse.  Mild mitral valve  regurgitation.   The results of the study is unremarkable. Please inform patient. I will discuss in detail at next appointment. Needs to call us in 2 weeks if she does not hear about radiology per the report.  Calcium score 0.

## 2023-10-08 NOTE — Telephone Encounter (Signed)
-----   Message from Garwin Brothers sent at 10/08/2023 11:02 AM EDT ----- The results of the study is unremarkable. Please inform patient. I will discuss in detail at next appointment.  Needs to call us in 2 weeks if she does not hear about radiology per the report.  Cc  primary care/referring physician Garwin Brothers, MD 10/08/2023 11:02 AM

## 2023-10-27 ENCOUNTER — Other Ambulatory Visit (HOSPITAL_COMMUNITY): Payer: Self-pay | Admitting: Rehabilitation

## 2023-10-27 DIAGNOSIS — M5416 Radiculopathy, lumbar region: Secondary | ICD-10-CM

## 2023-11-17 ENCOUNTER — Other Ambulatory Visit (HOSPITAL_BASED_OUTPATIENT_CLINIC_OR_DEPARTMENT_OTHER): Payer: Self-pay | Admitting: Rehabilitation

## 2023-11-17 DIAGNOSIS — M5416 Radiculopathy, lumbar region: Secondary | ICD-10-CM

## 2023-11-22 ENCOUNTER — Ambulatory Visit (HOSPITAL_BASED_OUTPATIENT_CLINIC_OR_DEPARTMENT_OTHER)
Admission: RE | Admit: 2023-11-22 | Discharge: 2023-11-22 | Disposition: A | Discharge status: Home / Self Care | Payer: Medicare (Managed Care) | Source: Ambulatory Visit | Attending: Rehabilitation | Admitting: Rehabilitation

## 2023-11-22 DIAGNOSIS — M5416 Radiculopathy, lumbar region: Secondary | ICD-10-CM | POA: Diagnosis present

## 2023-11-26 ENCOUNTER — Other Ambulatory Visit: Payer: Self-pay | Admitting: Student

## 2023-11-26 ENCOUNTER — Ambulatory Visit
Admission: RE | Admit: 2023-11-26 | Discharge: 2023-11-26 | Disposition: A | Discharge status: Home / Self Care | Payer: Medicare (Managed Care) | Source: Ambulatory Visit | Attending: Student | Admitting: Student

## 2023-11-26 DIAGNOSIS — G8929 Other chronic pain: Secondary | ICD-10-CM

## 2024-01-27 NOTE — Progress Notes (Signed)
Surgical Instructions   Your procedure is scheduled on Monday, February 08, 2024. Report to [facility] Main Entrance "A" at 8:40 A.M., then check in with the Admitting office. Any questions or running late day of surgery: call [phone]  Questions prior to your surgery date: call [phone], Monday-Friday, 8am-4pm. If you experience any cold or flu symptoms such as cough, fever, chills, shortness of breath, etc. between now and your scheduled surgery, please notify us at the above number.     Remember:  Do not eat after midnight the night before your surgery  You may drink clear liquids until 7:40 the morning of your surgery.   Clear liquids allowed are: Water, Non-Citrus Juices (without pulp), Carbonated Beverages, Clear Tea (no milk, honey, etc.), Black Coffee Only (NO MILK, CREAM OR POWDERED CREAMER of any kind), and Gatorade.    Take these medicines the morning of surgery with A SIP OF WATER  dicyclomine (BENTYL)  DULoxetine (CYMBALTA)  metoprolol tartrate (LOPRESSOR)  pantoprazole (PROTONIX)  rosuvastatin (CRESTOR)  TYLENOL 500 MG tablet    May take these medicines IF NEEDED: hydrOXYzine (VISTARIL)     One week prior to surgery, STOP taking any Aspirin (unless otherwise instructed by your surgeon) Aleve, Naproxen, Ibuprofen, Motrin, Advil, Goody's, BC's, all herbal medications, fish oil, and non-prescription vitamins.                     Do NOT Smoke (Tobacco/Vaping) for 24 hours prior to your procedure.  If you use a CPAP at night, you may bring your mask/headgear for your overnight stay.   You will be asked to remove any contacts, glasses, piercing's, hearing aid's, dentures/partials prior to surgery. Please bring cases for these items if needed.    Patients discharged the day of surgery will not be allowed to drive home, and someone needs to stay with them for 24 hours.  SURGICAL WAITING ROOM VISITATION Patients may have no more than 2 support people in the  waiting area - these visitors may rotate.   Pre-op nurse will coordinate an appropriate time for 1 ADULT support person, who may not rotate, to accompany patient in pre-op.  Children under the age of 31 must have an adult with them who is not the patient and must remain in the main waiting area with an adult.  If the patient needs to stay at the hospital during part of their recovery, the visitor guidelines for inpatient rooms apply.  Please refer to the [facility] website for the visitor guidelines for any additional information.   If you received a COVID test during your pre-op visit  it is requested that you wear a mask when out in public, stay away from anyone that may not be feeling well and notify your surgeon if you develop symptoms. If you have been in contact with anyone that has tested positive in the last 10 days please notify you surgeon.      Pre-operative 5 CHG Bathing Instructions   You can play a key role in reducing the risk of infection after surgery. Your skin needs to be as free of germs as possible. You can reduce the number of germs on your skin by washing with CHG (chlorhexidine gluconate) soap before surgery. CHG is an antiseptic soap that kills germs and continues to kill germs even after washing.   DO NOT use if you have an allergy to chlorhexidine/CHG or antibacterial soaps. If your skin becomes reddened or irritated, stop using the CHG  and notify one of our RNs at [phone].   Please shower with the CHG soap starting 4 days before surgery using the following schedule:     Please keep in mind the following:  DO NOT shave, including legs and underarms, starting the day of your first shower.   You may shave your face at any point before/day of surgery.  Place clean sheets on your bed the day you start using CHG soap. Use a clean washcloth (not used since being washed) for each shower. DO NOT sleep with pets once you start using the CHG.   CHG Shower  Instructions:  Wash your face and private area with normal soap. If you choose to wash your hair, wash first with your normal shampoo.  After you use shampoo/soap, rinse your hair and body thoroughly to remove shampoo/soap residue.  Turn the water OFF and apply about 3 tablespoons (45 ml) of CHG soap to a CLEAN washcloth.  Apply CHG soap ONLY FROM YOUR NECK DOWN TO YOUR TOES (washing for 3-5 minutes)  DO NOT use CHG soap on face, private areas, open wounds, or sores.  Pay special attention to the area where your surgery is being performed.  If you are having back surgery, having someone wash your back for you may be helpful. Wait 2 minutes after CHG soap is applied, then you may rinse off the CHG soap.  Pat dry with a clean towel  Put on clean clothes/pajamas   If you choose to wear lotion, please use ONLY the CHG-compatible lotions that are listed below.  Additional instructions for the day of surgery: DO NOT APPLY any lotions, deodorants, or perfumes.   Do not bring valuables to the hospital. [facility] is not responsible for any belongings/valuables. Do not wear nail polish, gel polish, artificial nails, or any other type of covering on natural nails (fingers and toes) Do not wear jewelry or makeup Put on clean/comfortable clothes.  Please brush your teeth.  Ask your nurse before applying any prescription medications to the skin.     CHG Compatible Lotions   Aveeno Moisturizing lotion  Cetaphil Moisturizing Cream  Cetaphil Moisturizing Lotion  Clairol Herbal Essence Moisturizing Lotion, Dry Skin  Clairol Herbal Essence Moisturizing Lotion, Extra Dry Skin  Clairol Herbal Essence Moisturizing Lotion, Normal Skin  Curel Age Defying Therapeutic Moisturizing Lotion with Alpha Hydroxy  Curel Extreme Care Body Lotion  Curel Soothing Hands Moisturizing Hand Lotion  Curel Therapeutic Moisturizing Cream, Fragrance-Free  Curel Therapeutic Moisturizing Lotion, Fragrance-Free  Curel  Therapeutic Moisturizing Lotion, Original Formula  Eucerin Daily Replenishing Lotion  Eucerin Dry Skin Therapy Plus Alpha Hydroxy Crme  Eucerin Dry Skin Therapy Plus Alpha Hydroxy Lotion  Eucerin Original Crme  Eucerin Original Lotion  Eucerin Plus Crme Eucerin Plus Lotion  Eucerin TriLipid Replenishing Lotion  Keri Anti-Bacterial Hand Lotion  Keri Deep Conditioning Original Lotion Dry Skin Formula Softly Scented  Keri Deep Conditioning Original Lotion, Fragrance Free Sensitive Skin Formula  Keri Lotion Fast Absorbing Fragrance Free Sensitive Skin Formula  Keri Lotion Fast Absorbing Softly Scented Dry Skin Formula  Keri Original Lotion  Keri Skin Renewal Lotion Keri Silky Smooth Lotion  Keri Silky Smooth Sensitive Skin Lotion  Nivea Body Creamy Conditioning Oil  Nivea Body Extra Enriched Teacher, adult education Moisturizing Lotion Nivea Crme  Nivea Skin Firming Lotion  NutraDerm 30 Skin Lotion  NutraDerm Skin Lotion  NutraDerm Therapeutic Skin Cream  NutraDerm Therapeutic Skin Lotion  ProShield Protective  Hand Cream  Provon moisturizing lotion  Please read over the following fact sheets that you were given.

## 2024-01-28 ENCOUNTER — Encounter (HOSPITAL_COMMUNITY): Payer: Self-pay

## 2024-01-28 ENCOUNTER — Encounter (HOSPITAL_COMMUNITY)
Admission: RE | Admit: 2024-01-28 | Discharge: 2024-01-28 | Disposition: A | Discharge status: Home / Self Care | Payer: Medicare (Managed Care) | Source: Ambulatory Visit | Attending: Orthopaedic Surgery | Admitting: Orthopaedic Surgery

## 2024-01-28 ENCOUNTER — Other Ambulatory Visit: Payer: Self-pay

## 2024-01-28 VITALS — BP 133/63 | HR 66 | Temp 98.0°F | Resp 17 | Ht 64.0 in | Wt 228.0 lb

## 2024-01-28 DIAGNOSIS — F419 Anxiety disorder, unspecified: Secondary | ICD-10-CM | POA: Diagnosis not present

## 2024-01-28 DIAGNOSIS — M1712 Unilateral primary osteoarthritis, left knee: Secondary | ICD-10-CM | POA: Insufficient documentation

## 2024-01-28 DIAGNOSIS — Z01812 Encounter for preprocedural laboratory examination: Secondary | ICD-10-CM | POA: Diagnosis present

## 2024-01-28 DIAGNOSIS — F39 Unspecified mood [affective] disorder: Secondary | ICD-10-CM | POA: Insufficient documentation

## 2024-01-28 DIAGNOSIS — I10 Essential (primary) hypertension: Secondary | ICD-10-CM | POA: Insufficient documentation

## 2024-01-28 DIAGNOSIS — K219 Gastro-esophageal reflux disease without esophagitis: Secondary | ICD-10-CM | POA: Diagnosis not present

## 2024-01-28 DIAGNOSIS — M797 Fibromyalgia: Secondary | ICD-10-CM | POA: Insufficient documentation

## 2024-01-28 DIAGNOSIS — K2270 Barrett's esophagus without dysplasia: Secondary | ICD-10-CM | POA: Insufficient documentation

## 2024-01-28 DIAGNOSIS — Z01818 Encounter for other preprocedural examination: Secondary | ICD-10-CM

## 2024-01-28 HISTORY — DX: Essential (primary) hypertension: I10

## 2024-01-28 LAB — CBC
HCT: 37.5 % (ref 36.0–46.0)
Hemoglobin: 11.9 g/dL — ABNORMAL LOW (ref 12.0–15.0)
MCH: 30.6 pg (ref 26.0–34.0)
MCHC: 31.7 g/dL (ref 30.0–36.0)
MCV: 96.4 fL (ref 80.0–100.0)
Platelets: 270 10*3/uL (ref 150–400)
RBC: 3.89 MIL/uL (ref 3.87–5.11)
RDW: 14.1 % (ref 11.5–15.5)
WBC: 3.9 10*3/uL — ABNORMAL LOW (ref 4.0–10.5)
nRBC: 0 % (ref 0.0–0.2)

## 2024-01-28 LAB — TYPE AND SCREEN
ABO/RH(D): O POS
Antibody Screen: NEGATIVE

## 2024-01-28 LAB — BASIC METABOLIC PANEL
Anion gap: 8 (ref 5–15)
BUN: 13 mg/dL (ref 8–23)
CO2: 30 mmol/L (ref 22–32)
Calcium: 9.4 mg/dL (ref 8.9–10.3)
Chloride: 103 mmol/L (ref 98–111)
Creatinine, Ser: 0.96 mg/dL (ref 0.44–1.00)
GFR, Estimated: >60 mL/min (ref >60)
Glucose, Bld: 85 mg/dL (ref 70–99)
Potassium: 4.3 mmol/L (ref 3.5–5.1)
Sodium: 141 mmol/L (ref 135–145)

## 2024-01-28 LAB — SURGICAL PCR SCREEN
MRSA, PCR: NEGATIVE
Staphylococcus aureus: NEGATIVE

## 2024-01-28 NOTE — Progress Notes (Signed)
PCP -  Dr. Bufford Spikes Cardiologist - Dr. Belva Crome  PPM/ICD - Denies Device Orders - n/a Rep Notified - n/a  Chest x-ray - n/a EKG - 09-03-23 Stress Test - Denies ECHO - 10-06-23 Cardiac Cath - Denies  Sleep Study - Denies CPAP - no/a  NON-diabetic  Last dose of GLP1 agonist-  Denies GLP1 instructions: n/a  Blood Thinner Instructions: Denies Aspirin Instructions: Denies taking, instructed not to take prior to surgery.  ERAS Protcol - clears till 7:40 PRE-SURGERY Ensure or G2- G2   COVID TEST- N/A   Anesthesia review: Yes, heart murmur, Barrett's esophagus, cardiac clearance from [facility] on 01-13-24  Patient denies shortness of breath, fever, cough and chest pain at PAT appointment. Patient denies any respiratory issues at this time.   All instructions explained to the patient, with a verbal understanding of the material. Patient agrees to go over the instructions while at home for a better understanding. Patient also instructed to self quarantine after being tested for COVID-19. The opportunity to ask questions was provided.

## 2024-01-29 ENCOUNTER — Other Ambulatory Visit: Payer: Self-pay | Admitting: Physician Assistant

## 2024-01-29 MED ORDER — DOCUSATE SODIUM 100 MG PO CAPS: 100.0000 mg | ORAL_CAPSULE | Freq: Every day | ORAL | 2 refills | Status: DC | PRN

## 2024-01-29 MED ORDER — DOXYCYCLINE HYCLATE 100 MG PO CAPS
100.0000 mg | ORAL_CAPSULE | Freq: Two times a day (BID) | ORAL | 0 refills | Status: DC
Start: 2024-01-29 — End: 2024-02-09

## 2024-01-29 MED ORDER — ONDANSETRON HCL 4 MG PO TABS: 4.0000 mg | ORAL_TABLET | Freq: Three times a day (TID) | ORAL | 0 refills | Status: DC | PRN

## 2024-01-29 MED ORDER — RIVAROXABAN 10 MG PO TABS: 10.0000 mg | ORAL_TABLET | Freq: Every day | ORAL | 0 refills | Status: DC

## 2024-01-29 MED ORDER — METHOCARBAMOL 500 MG PO TABS: 500.0000 mg | ORAL_TABLET | Freq: Two times a day (BID) | ORAL | 2 refills | Status: DC | PRN

## 2024-01-29 MED ORDER — OXYCODONE-ACETAMINOPHEN 5-325 MG PO TABS
1.0000 | ORAL_TABLET | Freq: Four times a day (QID) | ORAL | 0 refills | Status: DC | PRN
Start: 2024-01-29 — End: 2024-02-08

## 2024-01-29 NOTE — Anesthesia Preprocedure Evaluation (Addendum)
 Anesthesia Evaluation  Patient identified by MRN, date of birth, ID band Patient awake    Reviewed: Allergy & Precautions, H&P , NPO status , Patient's Chart, lab work & pertinent test results  Airway Mallampati: II   Neck ROM: full    Dental   Pulmonary neg pulmonary ROS   breath sounds clear to auscultation       Cardiovascular hypertension,  Rhythm:regular Rate:Normal     Neuro/Psych  Headaches PSYCHIATRIC DISORDERS Anxiety Depression Bipolar Disorder Schizophrenia   Neuromuscular disease    GI/Hepatic ,GERD  ,,  Endo/Other  diabetes  Class 3 obesity  Renal/GU      Musculoskeletal  (+) Arthritis ,  Fibromyalgia -  Abdominal   Peds  Hematology   Anesthesia Other Findings   Reproductive/Obstetrics                             Anesthesia Physical Anesthesia Plan  ASA: 3  Anesthesia Plan: General   Post-op Pain Management: Regional block*   Induction: Intravenous  PONV Risk Score and Plan: 3 and Ondansetron, Dexamethasone, Treatment may vary due to age or medical condition and Midazolam  Airway Management Planned: Oral ETT  Additional Equipment:   Intra-op Plan:   Post-operative Plan: Extubation in OR  Informed Consent: I have reviewed the patients History and Physical, chart, labs and discussed the procedure including the risks, benefits and alternatives for the proposed anesthesia with the patient or authorized representative who has indicated his/her understanding and acceptance.     Dental advisory given  Plan Discussed with: CRNA, Anesthesiologist and Surgeon  Anesthesia Plan Comments: (PAT note by Antionette Poles, PA-C: 68 year old female with pertinent history including due to affective disorder, GERD, Barrett's esophagus, fibromyalgia, chronic headaches, HTN, anxiety, morbid obesity.  Recently evaluated by cardiologist Dr. Tomie China on 09/03/2023 for chest discomfort.   Coronary CTA and echo ordered.  CTA 09/17/2023 showed calcium score of 0, normal coronaries.  Echo 10/06/2023 showed EF 60 to 65%, grade 1 DD, mild mitral regurgitation.  Preop labs reviewed, mild anemia hemoglobin 11.9, mild leukocytopenia WBC 3.9, otherwise WNL.  EKG 09/03/2023: Normal sinus rhythm.  Rate 69. Septal infarct , age undetermined  TTE 10/06/2023: 1. Left ventricular ejection fraction, by estimation, is 60 to 65%. The  left ventricle has normal function. The left ventricle has no regional  wall motion abnormalities. Left ventricular diastolic parameters are  consistent with Grade I diastolic  dysfunction (impaired relaxation).  2. Right ventricular systolic function is normal. The right ventricular  size is normal.  3. The mitral valve is normal in structure. Mild mitral valve  regurgitation. No evidence of mitral stenosis.  4. The aortic valve is normal in structure. Aortic valve regurgitation is  not visualized. No aortic stenosis is present.  5. The inferior vena cava is normal in size with greater than 50%  respiratory variability, suggesting right atrial pressure of 3 mmHg.   Coronary CTA 09/17/2023: IMPRESSION: 1. Coronary calcium score of 0. This was 1st percentile for age-, sex, and race-matched controls.  2. Total plaque volume 0 mm3.  3. Normal coronary origin with right dominance.  4. Normal coronary arteries.  CAD-RADS 0.  5.  Consider non-ischemic causes of chest pain.    )        Anesthesia Quick Evaluation

## 2024-01-29 NOTE — Progress Notes (Signed)
Anesthesia Chart Review:  68 year old female with pertinent history including due to affective disorder, GERD, Barrett's esophagus, fibromyalgia, chronic headaches, HTN, anxiety, morbid obesity.  Recently evaluated by cardiologist Dr. Tomie China on 09/03/2023 for chest discomfort.  Coronary CTA and echo ordered.  CTA 09/17/2023 showed calcium score of 0, normal coronaries.  Echo 10/06/2023 showed EF 60 to 65%, grade 1 DD, mild mitral regurgitation.  Preop labs reviewed, mild anemia hemoglobin 11.9, mild leukocytopenia WBC 3.9, otherwise WNL.  EKG 09/03/2023: Normal sinus rhythm.  Rate 69. Septal infarct , age undetermined  TTE 10/06/2023: 1. Left ventricular ejection fraction, by estimation, is 60 to 65%. The  left ventricle has normal function. The left ventricle has no regional  wall motion abnormalities. Left ventricular diastolic parameters are  consistent with Grade I diastolic  dysfunction (impaired relaxation).   2. Right ventricular systolic function is normal. The right ventricular  size is normal.   3. The mitral valve is normal in structure. Mild mitral valve  regurgitation. No evidence of mitral stenosis.   4. The aortic valve is normal in structure. Aortic valve regurgitation is  not visualized. No aortic stenosis is present.   5. The inferior vena cava is normal in size with greater than 50%  respiratory variability, suggesting right atrial pressure of 3 mmHg.   Coronary CTA 09/17/2023: IMPRESSION: 1. Coronary calcium score of 0. This was 1st percentile for age-, sex, and race-matched controls.   2. Total plaque volume 0 mm3.   3. Normal coronary origin with right dominance.   4. Normal coronary arteries.  CAD-RADS 0.   5.  Consider non-ischemic causes of chest pain.     [facility] Short Stay Center/Anesthesiology Phone [phone] 01/29/2024 4:14 PM

## 2024-02-05 ENCOUNTER — Telehealth: Payer: Self-pay | Admitting: *Deleted

## 2024-02-05 MED ORDER — TRANEXAMIC ACID 1000 MG/10ML IV SOLN
2000.0000 mg | INTRAVENOUS | Status: DC
  Filled 2024-02-05: qty 20

## 2024-02-05 NOTE — Telephone Encounter (Signed)
[facility] RNCM call to [facility] and updated the receptionist as well as the therapy supervisor that the patient is having a Left total knee replacement next week on 02/08/24. Asked if they need anything from [place] for her [facility] or OPPT once she goes home and therapy supervisor says, "no we will arrange everything". Updated Dr. Warren Danes staff. No HH or OPPT ordered by [facility] office at this time due to [facility] arranging for their patient.

## 2024-02-08 ENCOUNTER — Encounter (HOSPITAL_COMMUNITY): Payer: Self-pay | Admitting: Orthopaedic Surgery

## 2024-02-08 ENCOUNTER — Inpatient Hospital Stay (HOSPITAL_COMMUNITY): Payer: Medicare (Managed Care) | Admitting: Certified Registered"

## 2024-02-08 ENCOUNTER — Observation Stay (HOSPITAL_COMMUNITY)
Admission: RE | Admit: 2024-02-08 | Discharge: 2024-02-10 | Disposition: A | Discharge status: Skilled Nursing Facility | Payer: Medicare (Managed Care) | Attending: Orthopaedic Surgery | Admitting: Orthopaedic Surgery

## 2024-02-08 ENCOUNTER — Other Ambulatory Visit: Payer: Self-pay

## 2024-02-08 ENCOUNTER — Inpatient Hospital Stay (HOSPITAL_COMMUNITY): Payer: Medicare (Managed Care) | Admitting: Physician Assistant

## 2024-02-08 ENCOUNTER — Observation Stay (HOSPITAL_COMMUNITY): Payer: Medicare (Managed Care)

## 2024-02-08 ENCOUNTER — Other Ambulatory Visit (HOSPITAL_COMMUNITY): Payer: Self-pay

## 2024-02-08 ENCOUNTER — Encounter (HOSPITAL_COMMUNITY)
Admission: RE | Disposition: A | Discharge status: Skilled Nursing Facility | Payer: Self-pay | Source: Home / Self Care | Attending: Orthopaedic Surgery

## 2024-02-08 ENCOUNTER — Other Ambulatory Visit: Payer: Self-pay | Admitting: Physician Assistant

## 2024-02-08 DIAGNOSIS — Z7982 Long term (current) use of aspirin: Secondary | ICD-10-CM | POA: Insufficient documentation

## 2024-02-08 DIAGNOSIS — E119 Type 2 diabetes mellitus without complications: Secondary | ICD-10-CM | POA: Diagnosis not present

## 2024-02-08 DIAGNOSIS — I10 Essential (primary) hypertension: Secondary | ICD-10-CM

## 2024-02-08 DIAGNOSIS — M1712 Unilateral primary osteoarthritis, left knee: Secondary | ICD-10-CM | POA: Diagnosis present

## 2024-02-08 DIAGNOSIS — Z79899 Other long term (current) drug therapy: Secondary | ICD-10-CM | POA: Insufficient documentation

## 2024-02-08 DIAGNOSIS — F418 Other specified anxiety disorders: Secondary | ICD-10-CM | POA: Diagnosis not present

## 2024-02-08 DIAGNOSIS — Z96652 Presence of left artificial knee joint: Secondary | ICD-10-CM

## 2024-02-08 HISTORY — PX: TOTAL KNEE ARTHROPLASTY: SHX125

## 2024-02-08 HISTORY — DX: Presence of left artificial knee joint: Z96.652

## 2024-02-08 LAB — ABO/RH: ABO/RH(D): O POS

## 2024-02-08 LAB — GLUCOSE, CAPILLARY: Glucose-Capillary: 95 mg/dL (ref 70–99)

## 2024-02-08 SURGERY — ARTHROPLASTY, KNEE, TOTAL
Anesthesia: General | Site: Knee | Laterality: Left

## 2024-02-08 MED ORDER — BUPIVACAINE-MELOXICAM ER 400-12 MG/14ML IJ SOLN
INTRAMUSCULAR | Status: DC | PRN
  Administered 2024-02-08: 400 mg

## 2024-02-08 MED ORDER — ONDANSETRON HCL 4 MG PO TABS: 4.0000 mg | ORAL_TABLET | Freq: Four times a day (QID) | ORAL | Status: DC | PRN

## 2024-02-08 MED ORDER — FENTANYL CITRATE (PF) 250 MCG/5ML IJ SOLN
INTRAMUSCULAR | Status: AC
  Filled 2024-02-08: qty 5

## 2024-02-08 MED ORDER — LIDOCAINE 2% (20 MG/ML) 5 ML SYRINGE
INTRAMUSCULAR | Status: AC
  Filled 2024-02-08: qty 5

## 2024-02-08 MED ORDER — METOCLOPRAMIDE HCL 5 MG/ML IJ SOLN: 5.0000 mg | Freq: Three times a day (TID) | INTRAMUSCULAR | Status: DC | PRN

## 2024-02-08 MED ORDER — FENTANYL CITRATE (PF) 100 MCG/2ML IJ SOLN
25.0000 ug | INTRAMUSCULAR | Status: DC | PRN
  Administered 2024-02-08 (×2): 25 ug via INTRAVENOUS
  Administered 2024-02-08: 50 ug via INTRAVENOUS

## 2024-02-08 MED ORDER — APIXABAN 2.5 MG PO TABS
2.5000 mg | ORAL_TABLET | Freq: Two times a day (BID) | ORAL | Status: DC
  Administered 2024-02-09 – 2024-02-10 (×3): 2.5 mg via ORAL
  Filled 2024-02-08 (×3): qty 1

## 2024-02-08 MED ORDER — MIDAZOLAM HCL 2 MG/2ML IJ SOLN
INTRAMUSCULAR | Status: AC
  Administered 2024-02-08: 1 mg via INTRAVENOUS
  Filled 2024-02-08: qty 2

## 2024-02-08 MED ORDER — FENTANYL CITRATE (PF) 100 MCG/2ML IJ SOLN
INTRAMUSCULAR | Status: AC
  Filled 2024-02-08: qty 2

## 2024-02-08 MED ORDER — METOPROLOL TARTRATE 12.5 MG HALF TABLET
12.5000 mg | ORAL_TABLET | Freq: Two times a day (BID) | ORAL | Status: DC
  Administered 2024-02-08 – 2024-02-10 (×3): 12.5 mg via ORAL
  Filled 2024-02-08 (×4): qty 1

## 2024-02-08 MED ORDER — DOCUSATE SODIUM 100 MG PO CAPS
100.0000 mg | ORAL_CAPSULE | Freq: Two times a day (BID) | ORAL | Status: DC
  Administered 2024-02-08 – 2024-02-10 (×4): 100 mg via ORAL
  Filled 2024-02-08 (×5): qty 1

## 2024-02-08 MED ORDER — OXYCODONE HCL 5 MG PO TABS
10.0000 mg | ORAL_TABLET | ORAL | Status: DC | PRN
  Administered 2024-02-08: 10 mg via ORAL
  Administered 2024-02-08 – 2024-02-09 (×3): 15 mg via ORAL
  Administered 2024-02-10: 10 mg via ORAL
  Administered 2024-02-10: 15 mg via ORAL
  Filled 2024-02-08 (×3): qty 3
  Filled 2024-02-08 (×2): qty 2
  Filled 2024-02-08: qty 3

## 2024-02-08 MED ORDER — DOXYCYCLINE HYCLATE 100 MG PO TABS
100.0000 mg | ORAL_TABLET | Freq: Two times a day (BID) | ORAL | Status: DC
  Administered 2024-02-09 – 2024-02-10 (×3): 100 mg via ORAL
  Filled 2024-02-08 (×3): qty 1

## 2024-02-08 MED ORDER — TRANEXAMIC ACID-NACL 1000-0.7 MG/100ML-% IV SOLN
1000.0000 mg | Freq: Once | INTRAVENOUS | Status: AC
  Administered 2024-02-08: 1000 mg via INTRAVENOUS
  Filled 2024-02-08: qty 100

## 2024-02-08 MED ORDER — DEXAMETHASONE SODIUM PHOSPHATE 10 MG/ML IJ SOLN
10.0000 mg | Freq: Once | INTRAMUSCULAR | Status: AC
  Administered 2024-02-09: 10 mg via INTRAVENOUS
  Filled 2024-02-08: qty 1

## 2024-02-08 MED ORDER — CHLORHEXIDINE GLUCONATE 0.12 % MT SOLN
15.0000 mL | Freq: Once | OROMUCOSAL | Status: AC
  Administered 2024-02-08: 15 mL via OROMUCOSAL
  Filled 2024-02-08: qty 15

## 2024-02-08 MED ORDER — PRONTOSAN WOUND IRRIGATION OPTIME
TOPICAL | Status: DC | PRN
  Administered 2024-02-08: 350 mL via TOPICAL

## 2024-02-08 MED ORDER — PHENOL 1.4 % MT LIQD: 1.0000 | OROMUCOSAL | Status: DC | PRN

## 2024-02-08 MED ORDER — APIXABAN 2.5 MG PO TABS
2.5000 mg | ORAL_TABLET | Freq: Two times a day (BID) | ORAL | 0 refills | Status: DC
  Filled 2024-02-08: qty 60, 30d supply, fill #0

## 2024-02-08 MED ORDER — QUETIAPINE FUMARATE 100 MG PO TABS
100.0000 mg | ORAL_TABLET | Freq: Every day | ORAL | Status: DC
Start: 2024-02-08 — End: 2024-02-10
  Administered 2024-02-08 – 2024-02-09 (×2): 100 mg via ORAL
  Filled 2024-02-08 (×2): qty 1

## 2024-02-08 MED ORDER — ROCURONIUM BROMIDE 10 MG/ML (PF) SYRINGE
PREFILLED_SYRINGE | INTRAVENOUS | Status: DC | PRN
  Administered 2024-02-08: 50 mg via INTRAVENOUS

## 2024-02-08 MED ORDER — PROPOFOL 10 MG/ML IV BOLUS
INTRAVENOUS | Status: DC | PRN
  Administered 2024-02-08: 170 mg via INTRAVENOUS
  Administered 2024-02-08: 25 ug/kg/min via INTRAVENOUS

## 2024-02-08 MED ORDER — SODIUM CHLORIDE 0.9 % IR SOLN
Status: DC | PRN
  Administered 2024-02-08: 1000 mL

## 2024-02-08 MED ORDER — ONDANSETRON HCL 4 MG/2ML IJ SOLN: 4.0000 mg | Freq: Four times a day (QID) | INTRAMUSCULAR | Status: DC | PRN

## 2024-02-08 MED ORDER — LABETALOL HCL 5 MG/ML IV SOLN
INTRAVENOUS | Status: DC | PRN
  Administered 2024-02-08: 2.5 mg via INTRAVENOUS

## 2024-02-08 MED ORDER — DEXAMETHASONE SODIUM PHOSPHATE 10 MG/ML IJ SOLN
INTRAMUSCULAR | Status: AC
  Filled 2024-02-08: qty 1

## 2024-02-08 MED ORDER — ACETAMINOPHEN 500 MG PO TABS
1000.0000 mg | ORAL_TABLET | Freq: Four times a day (QID) | ORAL | Status: AC
  Administered 2024-02-08 – 2024-02-09 (×4): 1000 mg via ORAL
  Filled 2024-02-08 (×4): qty 2

## 2024-02-08 MED ORDER — FENTANYL CITRATE (PF) 100 MCG/2ML IJ SOLN: 50.0000 ug | Freq: Once | INTRAMUSCULAR | Status: AC

## 2024-02-08 MED ORDER — TRANEXAMIC ACID-NACL 1000-0.7 MG/100ML-% IV SOLN
1000.0000 mg | INTRAVENOUS | Status: AC
  Administered 2024-02-08: 1000 mg via INTRAVENOUS
  Filled 2024-02-08: qty 100

## 2024-02-08 MED ORDER — ROPIVACAINE HCL 5 MG/ML IJ SOLN
INTRAMUSCULAR | Status: DC | PRN
  Administered 2024-02-08: 25 mL via PERINEURAL

## 2024-02-08 MED ORDER — HYDROMORPHONE HCL 1 MG/ML IJ SOLN
INTRAMUSCULAR | Status: AC
  Filled 2024-02-08: qty 0.5

## 2024-02-08 MED ORDER — ACETAMINOPHEN 325 MG PO TABS: 325.0000 mg | ORAL_TABLET | Freq: Four times a day (QID) | ORAL | Status: DC | PRN

## 2024-02-08 MED ORDER — OXYCODONE HCL 5 MG PO TABS: 5.0000 mg | ORAL_TABLET | Freq: Once | ORAL | Status: DC | PRN

## 2024-02-08 MED ORDER — CEFAZOLIN SODIUM-DEXTROSE 2-4 GM/100ML-% IV SOLN
2.0000 g | Freq: Four times a day (QID) | INTRAVENOUS | Status: AC
  Administered 2024-02-08 (×2): 2 g via INTRAVENOUS
  Filled 2024-02-08 (×2): qty 100

## 2024-02-08 MED ORDER — VALBENAZINE TOSYLATE 40 MG PO CAPS
80.0000 mg | ORAL_CAPSULE | Freq: Every day | ORAL | Status: DC
  Administered 2024-02-08 – 2024-02-10 (×3): 80 mg via ORAL
  Filled 2024-02-08 (×4): qty 2

## 2024-02-08 MED ORDER — FENTANYL CITRATE (PF) 250 MCG/5ML IJ SOLN
INTRAMUSCULAR | Status: DC | PRN
Start: 2024-02-08 — End: 2024-02-08
  Administered 2024-02-08: 100 ug via INTRAVENOUS
  Administered 2024-02-08 (×3): 50 ug via INTRAVENOUS

## 2024-02-08 MED ORDER — VANCOMYCIN HCL 1000 MG IV SOLR
INTRAVENOUS | Status: AC
  Filled 2024-02-08: qty 20

## 2024-02-08 MED ORDER — METHOCARBAMOL 500 MG PO TABS
500.0000 mg | ORAL_TABLET | Freq: Four times a day (QID) | ORAL | Status: DC | PRN
  Administered 2024-02-08 – 2024-02-09 (×3): 500 mg via ORAL
  Filled 2024-02-08 (×3): qty 1

## 2024-02-08 MED ORDER — BUPIVACAINE-MELOXICAM ER 400-12 MG/14ML IJ SOLN
INTRAMUSCULAR | Status: AC
  Filled 2024-02-08: qty 1

## 2024-02-08 MED ORDER — 0.9 % SODIUM CHLORIDE (POUR BTL) OPTIME
TOPICAL | Status: DC | PRN
  Administered 2024-02-08: 1000 mL

## 2024-02-08 MED ORDER — HYDROMORPHONE HCL 1 MG/ML IJ SOLN
INTRAMUSCULAR | Status: DC | PRN
  Administered 2024-02-08: .5 mg via INTRAVENOUS

## 2024-02-08 MED ORDER — ONDANSETRON HCL 4 MG/2ML IJ SOLN
INTRAMUSCULAR | Status: AC
  Filled 2024-02-08: qty 2

## 2024-02-08 MED ORDER — LACTATED RINGERS IV SOLN: INTRAVENOUS | Status: DC

## 2024-02-08 MED ORDER — OXYCODONE HCL 5 MG/5ML PO SOLN: 5.0000 mg | Freq: Once | ORAL | Status: DC | PRN

## 2024-02-08 MED ORDER — POVIDONE-IODINE 10 % EX SWAB
2.0000 | Freq: Once | CUTANEOUS | Status: AC
  Administered 2024-02-08: 2 via TOPICAL

## 2024-02-08 MED ORDER — ONDANSETRON HCL 4 MG/2ML IJ SOLN
INTRAMUSCULAR | Status: DC | PRN
  Administered 2024-02-08: 4 mg via INTRAVENOUS

## 2024-02-08 MED ORDER — HYDROMORPHONE HCL 1 MG/ML IJ SOLN
0.5000 mg | INTRAMUSCULAR | Status: DC | PRN
  Administered 2024-02-09 – 2024-02-10 (×4): 1 mg via INTRAVENOUS
  Filled 2024-02-08 (×4): qty 1

## 2024-02-08 MED ORDER — TRANEXAMIC ACID 1000 MG/10ML IV SOLN
INTRAVENOUS | Status: DC | PRN
  Administered 2024-02-08: 2000 mg via TOPICAL

## 2024-02-08 MED ORDER — FERROUS SULFATE 325 (65 FE) MG PO TABS
325.0000 mg | ORAL_TABLET | Freq: Every day | ORAL | Status: DC
  Administered 2024-02-08 – 2024-02-10 (×3): 325 mg via ORAL
  Filled 2024-02-08 (×3): qty 1

## 2024-02-08 MED ORDER — ORAL CARE MOUTH RINSE: 15.0000 mL | Freq: Once | OROMUCOSAL | Status: AC

## 2024-02-08 MED ORDER — ROCURONIUM BROMIDE 10 MG/ML (PF) SYRINGE
PREFILLED_SYRINGE | INTRAVENOUS | Status: AC
  Filled 2024-02-08: qty 10

## 2024-02-08 MED ORDER — DEXAMETHASONE SODIUM PHOSPHATE 10 MG/ML IJ SOLN
INTRAMUSCULAR | Status: DC | PRN
  Administered 2024-02-08: 10 mg via INTRAVENOUS

## 2024-02-08 MED ORDER — LIDOCAINE 2% (20 MG/ML) 5 ML SYRINGE
INTRAMUSCULAR | Status: DC | PRN
  Administered 2024-02-08: 60 mg via INTRAVENOUS

## 2024-02-08 MED ORDER — CEFAZOLIN SODIUM-DEXTROSE 2-4 GM/100ML-% IV SOLN
2.0000 g | INTRAVENOUS | Status: AC
  Administered 2024-02-08: 2 g via INTRAVENOUS
  Filled 2024-02-08: qty 100

## 2024-02-08 MED ORDER — MENTHOL 3 MG MT LOZG: 1.0000 | LOZENGE | OROMUCOSAL | Status: DC | PRN

## 2024-02-08 MED ORDER — EPHEDRINE 5 MG/ML INJ
INTRAVENOUS | Status: AC
  Filled 2024-02-08: qty 5

## 2024-02-08 MED ORDER — METOCLOPRAMIDE HCL 5 MG PO TABS: 5.0000 mg | ORAL_TABLET | Freq: Three times a day (TID) | ORAL | Status: DC | PRN

## 2024-02-08 MED ORDER — OXYCODONE HCL 5 MG PO TABS
5.0000 mg | ORAL_TABLET | ORAL | Status: DC | PRN
  Administered 2024-02-09: 10 mg via ORAL
  Filled 2024-02-08: qty 2

## 2024-02-08 MED ORDER — METHOCARBAMOL 1000 MG/10ML IJ SOLN
500.0000 mg | Freq: Four times a day (QID) | INTRAMUSCULAR | Status: DC | PRN
  Administered 2024-02-10: 500 mg via INTRAVENOUS
  Filled 2024-02-08: qty 10

## 2024-02-08 MED ORDER — PHENYLEPHRINE HCL-NACL 20-0.9 MG/250ML-% IV SOLN
INTRAVENOUS | Status: DC | PRN
  Administered 2024-02-08: 30 ug/min via INTRAVENOUS

## 2024-02-08 MED ORDER — EPHEDRINE SULFATE-NACL 50-0.9 MG/10ML-% IV SOSY
PREFILLED_SYRINGE | INTRAVENOUS | Status: DC | PRN
  Administered 2024-02-08: 10 mg via INTRAVENOUS

## 2024-02-08 MED ORDER — VANCOMYCIN HCL 1000 MG IV SOLR
INTRAVENOUS | Status: DC | PRN
  Administered 2024-02-08: 1000 mg via TOPICAL

## 2024-02-08 MED ORDER — FENTANYL CITRATE (PF) 100 MCG/2ML IJ SOLN
INTRAMUSCULAR | Status: AC
  Administered 2024-02-08: 50 ug via INTRAVENOUS
  Filled 2024-02-08: qty 2

## 2024-02-08 MED ORDER — ARIPIPRAZOLE 10 MG PO TABS
20.0000 mg | ORAL_TABLET | Freq: Every day | ORAL | Status: DC
  Administered 2024-02-08 – 2024-02-09 (×2): 20 mg via ORAL
  Filled 2024-02-08 (×2): qty 2

## 2024-02-08 MED ORDER — MIDAZOLAM HCL 2 MG/2ML IJ SOLN: 1.0000 mg | Freq: Once | INTRAMUSCULAR | Status: AC

## 2024-02-08 SURGICAL SUPPLY — 85 items
ALCOHOL 70% 16 OZ (MISCELLANEOUS) ×1 IMPLANT
BAG COUNTER SPONGE SURGICOUNT (BAG) IMPLANT
BAG DECANTER FOR FLEXI CONT (MISCELLANEOUS) ×1 IMPLANT
BANDAGE ESMARK 6X9 LF (GAUZE/BANDAGES/DRESSINGS) IMPLANT
BIT DRILL QUICK REL 1/8 2PK SL (BIT) IMPLANT
BLADE SAG 18X100X1.27 (BLADE) ×1 IMPLANT
BLADE SAW SAG 90X13X1.27 (BLADE) IMPLANT
BLADE SAW SGTL 73X25 THK (BLADE) ×1 IMPLANT
BNDG ESMARK 6X9 LF (GAUZE/BANDAGES/DRESSINGS)
BOWL SMART MIX CTS (DISPOSABLE) ×1 IMPLANT
CANISTER WOUNDNEG PRESSURE 500 (CANNISTER) IMPLANT
CEMENT BONE REFOBACIN R1X40 US (Cement) IMPLANT
CLSR STERI-STRIP ANTIMIC 1/2X4 (GAUZE/BANDAGES/DRESSINGS) ×2 IMPLANT
COOLER ICEMAN CLASSIC (MISCELLANEOUS) ×1 IMPLANT
COVER SURGICAL LIGHT HANDLE (MISCELLANEOUS) ×1 IMPLANT
CUFF TOURN SGL QUICK 42 (TOURNIQUET CUFF) IMPLANT
CUFF TRNQT CYL 34X4.125X (TOURNIQUET CUFF) ×1 IMPLANT
DERMABOND ADVANCED .7 DNX12 (GAUZE/BANDAGES/DRESSINGS) ×1 IMPLANT
DRAPE EXTREMITY T 121X128X90 (DISPOSABLE) ×1 IMPLANT
DRAPE HALF SHEET 40X57 (DRAPES) ×1 IMPLANT
DRAPE INCISE IOBAN 66X45 STRL (DRAPES) ×1 IMPLANT
DRAPE POUCH INSTRU U-SHP 10X18 (DRAPES) ×1 IMPLANT
DRAPE SURG ORHT 6 SPLT 77X108 (DRAPES) IMPLANT
DRAPE U-SHAPE 47X51 STRL (DRAPES) ×2 IMPLANT
DRESSING PEEL AND PLAC PRVNA20 (GAUZE/BANDAGES/DRESSINGS) IMPLANT
DRSG AQUACEL AG ADV 3.5X10 (GAUZE/BANDAGES/DRESSINGS) ×1 IMPLANT
DRSG PEEL AND PLACE PREVENA 20 (GAUZE/BANDAGES/DRESSINGS) ×1
DURAPREP 26ML APPLICATOR (WOUND CARE) ×3 IMPLANT
ELECT CAUTERY BLADE 6.4 (BLADE) ×1 IMPLANT
ELECT PENCIL ROCKER SW 15FT (MISCELLANEOUS) ×1 IMPLANT
ELECT REM PT RETURN 9FT ADLT (ELECTROSURGICAL) ×1
ELECTRODE REM PT RTRN 9FT ADLT (ELECTROSURGICAL) ×1 IMPLANT
FEMUR CMT CCR STD SZ7 L KNEE (Knees) ×1 IMPLANT
FEMUR CMTD CCR STD SZ7 L KNEE (Knees) IMPLANT
GLOVE BIOGEL PI IND STRL 7.0 (GLOVE) ×2 IMPLANT
GLOVE BIOGEL PI IND STRL 7.5 (GLOVE) ×5 IMPLANT
GLOVE ECLIPSE 7.0 STRL STRAW (GLOVE) ×3 IMPLANT
GLOVE INDICATOR 7.0 STRL GRN (GLOVE) ×1 IMPLANT
GLOVE INDICATOR 7.5 STRL GRN (GLOVE) ×1 IMPLANT
GLOVE PROTEXIS LATEX SZ 7.5 (GLOVE) ×5
GLOVE SURG LATEX 7.5 PF (GLOVE) IMPLANT
GLOVE SURG SS PI 7.0 STRL IVOR (GLOVE) IMPLANT
GLOVE SURG SYN 7.5 E (GLOVE) ×2
GLOVE SURG SYN 7.5 PF PI (GLOVE) ×2 IMPLANT
GLOVE SURG UNDER LTX SZ7.5 (GLOVE) ×2 IMPLANT
GLOVE SURG UNDER POLY LF SZ7 (GLOVE) ×2 IMPLANT
GOWN STRL REUS W/ TWL LRG LVL3 (GOWN DISPOSABLE) ×1 IMPLANT
GOWN STRL SURGICAL XL XLNG (GOWN DISPOSABLE) ×1 IMPLANT
GOWN TOGA ZIPPER T7+ PEEL AWAY (MISCELLANEOUS) ×2 IMPLANT
HOOD PEEL AWAY T7 (MISCELLANEOUS) ×1 IMPLANT
KIT BASIN OR (CUSTOM PROCEDURE TRAY) ×1 IMPLANT
KIT DRSG PREVENA PLUS 7DAY 125 (MISCELLANEOUS) IMPLANT
KIT TURNOVER KIT B (KITS) ×1 IMPLANT
LINER TIB KNEE PS EF/6-9 12 LT (Liner) IMPLANT
MANIFOLD NEPTUNE II (INSTRUMENTS) ×1 IMPLANT
MARKER SKIN DUAL TIP RULER LAB (MISCELLANEOUS) ×2 IMPLANT
NDL SPNL 18GX3.5 QUINCKE PK (NEEDLE) ×1 IMPLANT
NEEDLE SPNL 18GX3.5 QUINCKE PK (NEEDLE) ×1
NS IRRIG 1000ML POUR BTL (IV SOLUTION) ×1 IMPLANT
PACK TOTAL JOINT (CUSTOM PROCEDURE TRAY) ×1 IMPLANT
PAD ARMBOARD 7.5X6 YLW CONV (MISCELLANEOUS) ×2 IMPLANT
PAD COLD SHLDR WRAP-ON (PAD) ×1 IMPLANT
PIN DRILL HDLS TROCAR 75 4PK (PIN) IMPLANT
SCREW FEMALE HEX FIX 25X2.5 (ORTHOPEDIC DISPOSABLE SUPPLIES) IMPLANT
SET HNDPC FAN SPRY TIP SCT (DISPOSABLE) ×1 IMPLANT
SOLUTION PRONTOSAN WOUND 350ML (IRRIGATION / IRRIGATOR) ×1 IMPLANT
STAPLER VISISTAT 35W (STAPLE) IMPLANT
STEM POLY PAT PLY 32M KNEE (Knees) IMPLANT
STEM TIB ST PERS 14+30 (Stem) IMPLANT
STEM TIBIA 5 DEG SZ F L KNEE (Knees) IMPLANT
SUCTION TUBE FRAZIER 10FR DISP (SUCTIONS) ×1 IMPLANT
SUT ETHILON 2 0 FS 18 (SUTURE) IMPLANT
SUT MNCRL AB 3-0 PS2 27 (SUTURE) IMPLANT
SUT STRATAFIX 1PDS 45CM VIOLET (SUTURE) IMPLANT
SUT VIC AB 0 CT1 27XBRD ANBCTR (SUTURE) ×2 IMPLANT
SUT VIC AB 1 CTX 27 (SUTURE) ×3 IMPLANT
SUT VIC AB 2-0 CT1 TAPERPNT 27 (SUTURE) ×4 IMPLANT
SYR 50ML LL SCALE MARK (SYRINGE) ×2 IMPLANT
TIBIA STEM 5 DEG SZ F L KNEE (Knees) ×1 IMPLANT
TOWEL GREEN STERILE (TOWEL DISPOSABLE) ×1 IMPLANT
TOWEL GREEN STERILE FF (TOWEL DISPOSABLE) ×1 IMPLANT
TRAY CATH INTERMITTENT SS 16FR (CATHETERS) IMPLANT
TUBE SUCT ARGYLE STRL (TUBING) ×1 IMPLANT
UNDERPAD 30X36 HEAVY ABSORB (UNDERPADS AND DIAPERS) ×1 IMPLANT
YANKAUER SUCT BULB TIP NO VENT (SUCTIONS) ×2 IMPLANT

## 2024-02-08 NOTE — Transfer of Care (Signed)
 Immediate Anesthesia Transfer of Care Note  Patient: Bridget [name]  Procedure(s) Performed: LEFT TOTAL KNEE REPLACEMENT (Left: Knee)  Patient Location: PACU  Anesthesia Type:General  Level of Consciousness: drowsy and patient cooperative  Airway & Oxygen Therapy: Patient Spontanous Breathing and Patient connected to face mask oxygen  Post-op Assessment: Report given to RN, Post -op Vital signs reviewed and stable, and Patient moving all extremities X 4  Post vital signs: Reviewed and stable  Last Vitals:  Vitals Value Taken Time  BP    Temp    Pulse 79 02/08/24 1312  Resp 14 02/08/24 1312  SpO2 97 % 02/08/24 1312  Vitals shown include unfiled device data.  Last Pain:  Vitals:   02/08/24 0923  TempSrc:   PainSc: 8       Patients Stated Pain Goal: 2 (02/08/24 1610)  Complications: No notable events documented.

## 2024-02-08 NOTE — Discharge Instructions (Addendum)

## 2024-02-08 NOTE — Anesthesia Procedure Notes (Signed)
 Procedure Name: Intubation Date/Time: 02/08/2024 10:45 AM  Performed by: Chari Manning, RNPre-anesthesia Checklist: Patient identified, Emergency Drugs available, Suction available and Patient being monitored Patient Re-evaluated:Patient Re-evaluated prior to induction Oxygen Delivery Method: Circle system utilized Preoxygenation: Pre-oxygenation with 100% oxygen Induction Type: IV induction Ventilation: Two handed mask ventilation required Laryngoscope Size: Mac and 3 Grade View: Grade I Tube type: Oral Tube size: 7.0 mm Number of attempts: 1 Airway Equipment and Method: Stylet and Oral airway Placement Confirmation: ETT inserted through vocal cords under direct vision, positive ETCO2 and breath sounds checked- equal and bilateral Secured at: 23 cm Tube secured with: Tape Dental Injury: Teeth and Oropharynx as per pre-operative assessment  Comments: atraumatic

## 2024-02-08 NOTE — Anesthesia Procedure Notes (Signed)
 Anesthesia Regional Block: Adductor canal block   Pre-Anesthetic Checklist: , timeout performed,  Correct Patient, Correct Site, Correct Laterality,  Correct Procedure, Correct Position, site marked,  Risks and benefits discussed,  Surgical consent,  Pre-op evaluation,  At surgeon's request and post-op pain management  Laterality: Left  Prep: chloraprep       Needles:  Injection technique: Single-shot  Needle Type: Echogenic Needle     Needle Length: 9cm  Needle Gauge: 21     Additional Needles:   Narrative:  Start time: 02/08/2024 10:20 AM End time: 02/08/2024 10:25 AM Injection made incrementally with aspirations every 5 mL.  Performed by: Personally  Anesthesiologist: Achille Rich, MD  Additional Notes: Pt tolerated the procedure well.

## 2024-02-08 NOTE — Progress Notes (Signed)
 Spoke with PACE and Sports coach. They prefer patient to go to [facility] following surgery for PT/OT. Instructed [facility] and Social worker [facility] social worker would be in touch regarding this issue once the patient gets to the floor.

## 2024-02-08 NOTE — Op Note (Addendum)
 Total Knee Arthroplasty Procedure Note  Preoperative diagnosis: Left knee osteoarthritis  Postoperative diagnosis:same  Operative findings: Complete loss joint space from all 3 compartments Flexion contracture Valgus deformity, MCL laxity but intact Osteopenic bone  Operative procedure:  Left total knee arthroplasty. CPT [id number] Application of incisional VAC.  Surgeon: N. Glee Arvin, MD  Assist: Oneal Grout, PA-C; necessary for the timely completion of procedure and due to complexity of procedure.  Anesthesia: general, regional, local  Tourniquet time: see anesthesia record  Implants used: Zimmer persona Femur: PS 7 Tibia: F with 30 mm cemented stem Patella: 32 mm Polyethylene: 12 mm constrained  Indication: Bridget [name] is a 68 y.o. year old female with a history of knee pain. Having failed conservative management, the patient elected to proceed with a total knee arthroplasty.  We have reviewed the risk and benefits of the surgery and they elected to proceed after voicing understanding.  Procedure:  After informed consent was obtained and understanding of the risk were voiced including but not limited to bleeding, infection, damage to surrounding structures including nerves and vessels, blood clots, leg length inequality and the failure to achieve desired results, the operative extremity was marked with verbal confirmation of the patient in the holding area.   The patient was then brought to the operating room and transported to the operating room table in the supine position.  A tourniquet was applied to the operative extremity around the upper thigh. The operative limb was then prepped and draped in the usual sterile fashion and preoperative antibiotics were administered.  A time out was performed prior to the start of surgery confirming the correct extremity, preoperative antibiotic administration, as well as team members, implants and instruments available  for the case. Correct surgical site was also confirmed with preoperative radiographs. The limb was then elevated for exsanguination and the tourniquet was inflated. A midline incision was made and a standard medial parapatellar approach was performed.  The infrapatellar fat pad was removed.  Suprapatellar synovium was removed to reveal the anterior distal femoral cortex.  A medial peel was performed to release the capsule and the deep MCL off of the medial tibial plateau back to the semimembranosus.  The patella was then everted which showed complete loss of articular cartilage and was resected down from 25 mm to 15 mm and sized to a 32 mm.  A cover was placed on the patella for protection from retractors.  The knee was then brought into full flexion and we then turned our attention to the femur.  The cruciates were sacrificed.  Start site was drilled in the femur and the intramedullary distal femoral cutting guide was placed, set at 6 degrees valgus, taking 12 mm of distal resection for the valgus deformity and flexion contracture. The distal cut was made. Osteophytes were then removed.  Next, the proximal tibial cutting guide was placed with appropriate slope, varus/valgus alignment and depth of resection.  The drop rod was attached to confirm that it was aimed at the second metatarsal.  The proximal tibial cut was made taking 2 mm off the low lateral side. Gap blocks were then used to assess the extension gap and alignment, and appropriate soft tissue releases were performed. Attention was turned back to the femur, which was sized using the sizing guide to a size 7. Appropriate rotation of the femoral component was determined using epicondylar axis, Whiteside's line, and assessing the flexion gap under ligament tension. The appropriate size 4-in-1 cutting block was  placed and checked with an angel wing and cuts were made. Posterior femoral osteophytes and uncapped bone were then removed with the curved  osteotome.  The menisci were removed.  Trial components were placed, and stability was checked in full extension, mid-flexion, and deep flexion.  The MCL was intact but lax from the valgus deformity.  I decided to use a constrained poly.  The femoral box was prepped for a constrained poly.  The patella tracked well without a lateral release.  The femoral lugs were then drilled.  The tibial trial was floated and knee was taken through range of motion the proper rotation was determined and marked.   Trial components were then removed and tibial preparation performed.  The trial tibia was pointed to the medial third of the tibial tubercle.  The tibia was sized for a size F with a 30 mm cemented stem component.   The bony surfaces were irrigated with a pulse lavage and then dried. Bone cement was vacuum mixed on the back table, and the final components sized above were cemented into place.  Antibiotic irrigation was placed in the knee joint and soft tissues while the cement cured.  After cement had finished curing, excess cement was removed. The stability of the construct was re-evaluated throughout a range of motion and found to be acceptable. The trial liner was removed, the knee was copiously irrigated, and the knee was re-evaluated for any excess bone debris. The real polyethylene liner, 12 mm thick, was inserted and checked to ensure the locking mechanism had engaged appropriately. The tourniquet was deflated and hemostasis was achieved. The wound was irrigated with normal saline.  One gram of vancomycin powder was placed in the surgical bed.  Topical mixture of 0.25% bupivacaine and meloxicam was placed in the joint for postoperative pain.  Capsular closure was performed with a #1 stratafix in flexion, subcutaneous fat closed with a 0 vicryl suture, then subcutaneous tissue closed with interrupted 2.0 vicryl suture. The skin was then closed with a 2.0 nylon and incisional VAC. A sterile dressing was applied.   The patient was awakened in the operating room and taken to recovery in stable condition. All sponge, needle, and instrument counts were correct at the end of the case.  Bridget [name] was necessary for opening, closing, retracting, limb positioning and overall facilitation and completion of the surgery.  Position: supine  Complications: none.  Time Out: performed   Drains/Packing: none  Estimated blood loss: minimal  Returned to Recovery Room: in good condition.   Antibiotics: yes   Mechanical VTE (DVT) Prophylaxis: sequential compression devices, TED thigh-high  Chemical VTE (DVT) Prophylaxis: eliquis  Fluid Replacement  Crystalloid: see anesthesia record Blood: none  FFP: none   Specimens Removed: 1 to pathology   Sponge and Instrument Count Correct? yes   PACU: portable radiograph - knee AP and Lateral   Plan/RTC: Return in 2 weeks for wound check.   Weight Bearing/Load Lower Extremity: full   Implant Name Type Inv. Item Serial No. Manufacturer Lot No. LRB No. Used Action  CEMENT BONE REFOBACIN R1X40 [place] - [id number] Cement CEMENT BONE REFOBACIN R1X40 [place]  ZIMMER RECON(ORTH,TRAU,BIO,SG) [id number] Left 1 Implanted  CEMENT BONE REFOBACIN R1X40 [place] - [id number] Cement CEMENT BONE REFOBACIN R1X40 [place]  ZIMMER RECON(ORTH,TRAU,BIO,SG) [id number] Left 1 Implanted  STEM POLY PAT PLY 8M KNEE - [id number] Knees STEM POLY PAT PLY 8M KNEE  ZIMMER RECON(ORTH,TRAU,BIO,SG) [id number] Left 1 Implanted  STEM TIB ST PERS 14+30 - [id number] Stem STEM  TIB ST PERS 14+30  ZIMMER RECON(ORTH,TRAU,BIO,SG) [id number] Left 1 Implanted  TIBIA STEM 5 DEG SZ F L KNEE - [id number] Knees TIBIA STEM 5 DEG SZ F L KNEE  ZIMMER RECON(ORTH,TRAU,BIO,SG) [id number] Left 1 Implanted  LINER TIB KNEE PS EF/6-9 12 LT - [id number] Liner LINER TIB KNEE PS EF/6-9 12 LT  ZIMMER [employer]ON(ORTH,TRAU,BIO,SG) [id number] Left 1 Implanted  FEMUR CMT CCR STD SZ7 L KNEE - [id number] Knees FEMUR CMT CCR STD SZ7 L KNEE  ZIMMER  RECON(ORTH,TRAU,BIO,SG) [id number] Left 1 Implanted    N. Glee Arvin, MD [facility] 12:25 PM

## 2024-02-08 NOTE — H&P (Signed)
 PREOPERATIVE H&P  Chief Complaint: left knee osteoarthritis  HPI: Bridget [name] is a 68 y.o. female who presents for surgical treatment of left knee osteoarthritis.  She denies any changes in medical history.  Past Surgical History:  Procedure Laterality Date   ABDOMINAL HYSTERECTOMY     BREAST EXCISIONAL BIOPSY Right    BREAST EXCISIONAL BIOPSY Right    BREAST EXCISIONAL BIOPSY Right    BREAST EXCISIONAL BIOPSY Right    BUNIONECTOMY Bilateral    COLONOSCOPY  08/2013   HAND SURGERY Right    carpel tunnel surgery   INJECTION KNEE Bilateral 10/30/2021   Procedure: BILATERAL KNEE INJECTIONS;  Surgeon: Tarry Kos, MD;  Location: [facility];  Service: Orthopedics;  Laterality: Bilateral;   MULTIPLE TOOTH EXTRACTIONS     full dentures   SHOULDER ARTHROSCOPY WITH ROTATOR CUFF REPAIR AND SUBACROMIAL DECOMPRESSION Right 10/30/2021   Procedure: RIGHT SHOULDER ARTHROSCOPY WITH ROTATOR CUFF REPAIR AND SUBACROMIAL DECOMPRESSION, DEBRIDEMENT;  Surgeon: Tarry Kos, MD;  Location: [facility];  Service: Orthopedics;  Laterality: Right;   TRIGGER FINGER RELEASE Bilateral    thumbs   Social History   Socioeconomic History   Marital status: Married    Spouse name: Not on file   Number of children: Not on file   Years of education: Not on file   Highest education level: Not on file  Occupational History   Not on file  Tobacco Use   Smoking status: Never   Smokeless tobacco: Never  Vaping Use   Vaping status: Never Used  Substance and Sexual Activity   Alcohol use: No   Drug use: No   Sexual activity: Not Currently    Birth control/protection: Post-menopausal    Comment: Hysterectomy  Other Topics Concern   Not on file  Social History Narrative   Not on file   Social Drivers of Health   Financial Resource Strain: Not on file  Food Insecurity: Not on file  Transportation Needs: Not on file  Physical Activity: Not on file  Stress: Not  on file  Social Connections: Not on file   Family History  Problem Relation Age of Onset   Diabetes Mother    Prostate cancer Father    High blood pressure Sister    Colon cancer Neg Hx    Colon polyps Neg Hx    Rectal cancer Neg Hx    Stomach cancer Neg Hx    Allergies  Allergen Reactions   Flexeril [Cyclobenzaprine] Other (See Comments)    Urinary retention   Amitriptyline Other (See Comments)    Urinary retention   Penicillins Rash    Has patient had a PCN reaction causing immediate rash, facial/tongue/throat swelling, SOB or lightheadedness with hypotension: yes Has patient had a PCN reaction causing severe rash involving mucus membranes or skin necrosis: no Has patient had a PCN reaction that required hospitalization: No Has patient had a PCN reaction occurring within the last 10 years: No If all of the above answers are "NO", then may proceed with Cephalosporin use.    Prior to Admission medications   Medication Sig Start Date End Date Taking? Authorizing Provider  doxycycline (VIBRAMYCIN) 100 MG capsule Take 1 capsule (100 mg total) by mouth 2 (two) times daily. To be taken after surgery 01/29/24   Cristie Hem, PA-C  antiseptic oral rinse (BIOTENE) LIQD 15 mLs by Mouth Rinse route 6 (six) times daily.    [provider]  ARIPiprazole (ABILIFY)  20 MG tablet Take 20 mg by mouth at bedtime.    [provider]  ARIPiprazole ER (ABILIFY MAINTENA) 400 MG PRSY prefilled syringe Inject 400 mg into the muscle every 28 (twenty-eight) days.    [provider]  aspirin EC 81 MG tablet Take 1 tablet (81 mg total) by mouth 2 (two) times daily. To be taken after surgery to prevent blood clots 04/09/23 04/08/24  Cristie Hem, PA-C  CEQUA 0.09 % SOLN Place 1 drop into both eyes every 12 (twelve) hours.    [provider]  dicyclomine (BENTYL) 20 MG tablet Take 1 tablet (20 mg total) by mouth 2 (two) times daily. 11/06/22   Charlynne Pander, MD   docusate sodium (COLACE) 100 MG capsule Take 1 capsule (100 mg total) by mouth daily as needed. Patient not taking: Reported on 09/03/2023 04/09/23 04/08/24  Cristie Hem, PA-C  docusate sodium (COLACE) 100 MG capsule Take 1 capsule (100 mg total) by mouth daily as needed. 01/29/24 01/28/25  Cristie Hem, PA-C  Doxepin HCl 6 MG TABS Take 1 tablet (6 mg total) by mouth at bedtime. 03/04/23   Olalere, Minna Antis, MD  DULoxetine (CYMBALTA) 60 MG capsule Take 120 mg by mouth in the morning.    [provider]  hydrOXYzine (VISTARIL) 25 MG capsule Take 1 capsule (25 mg total) by mouth 3 (three) times daily as needed for anxiety. 01/24/20   Gwyneth Sprout, MD  latanoprost (XALATAN) 0.005 % ophthalmic solution Place 1 drop into both eyes at bedtime.    [provider]  lidocaine (LIDODERM) 5 % Place 1 patch onto the skin daily as needed (for lower back pain- Remove & Discard patch within 12 hours or as directed by MD).    [provider]  methocarbamol (ROBAXIN) 500 MG tablet Take 1 tablet (500 mg total) by mouth 2 (two) times daily as needed. 01/29/24   Cristie Hem, PA-C  methocarbamol (ROBAXIN-750) 750 MG tablet Take 1 tablet (750 mg total) by mouth 2 (two) times daily as needed for muscle spasms. Patient not taking: Reported on 09/03/2023 04/09/23   Cristie Hem, PA-C  metoprolol tartrate (LOPRESSOR) 50 MG tablet Take 50 mg Lopressor 2 hours prior to your CT scan for a heart rate greater than 55. Patient taking differently: Take 12.5 mg by mouth 2 (two) times daily. Take 12.5 mg Lopressor BID for fast heart rate over 55 09/03/23   Revankar, Aundra Dubin, MD  ondansetron (ZOFRAN) 4 MG tablet Take 1 tablet (4 mg total) by mouth every 8 (eight) hours as needed for nausea or vomiting. 01/29/24   Cristie Hem, PA-C  oxyCODONE-acetaminophen (PERCOCET) 5-325 MG tablet Take 1-2 tablets by mouth every 6 (six) hours as needed. To be taken after surgery 01/29/24   Cristie Hem,  PA-C  pantoprazole (PROTONIX) 40 MG tablet Take 40 mg by mouth daily before breakfast.    [provider]  polyethylene glycol (MIRALAX / GLYCOLAX) packet Take 17 g by mouth daily.    [provider]  QUEtiapine (SEROQUEL XR) 50 MG TB24 24 hr tablet Take 100 mg by mouth at bedtime.    [provider]  rivaroxaban (XARELTO) 10 MG TABS tablet Take 1 tablet (10 mg total) by mouth daily. To be taken after surgery to prevent blood clots 01/29/24   Cristie Hem, PA-C  rosuvastatin (CRESTOR) 20 MG tablet Take 20 mg by mouth daily.    [provider]  senna (  SENOKOT) 8.6 MG TABS tablet Take 1 tablet by mouth at bedtime.    [provider]  Suvorexant (BELSOMRA) 20 MG TABS Take 20 mg by mouth at bedtime.    [provider]  temazepam (RESTORIL) 15 MG capsule Take 15 mg by mouth at bedtime as needed for sleep.    [provider]  temazepam (RESTORIL) 15 MG capsule Take 7.5 mg by mouth at bedtime as needed for sleep.    [provider]  traMADol (ULTRAM) 50 MG tablet Take 1-2 tablets (50-100 mg total) by mouth daily as needed. Patient not taking: Reported on 09/03/2023 03/13/22   Cristie Hem, PA-C  TYLENOL 500 MG tablet Take 1,000 mg by mouth 2 (two) times daily.    [provider]  valbenazine (INGREZZA) 80 MG capsule Take 80 mg by mouth daily. Movements in the face 02/03/24   [provider]     Positive ROS: All other systems have been reviewed and were otherwise negative with the exception of those mentioned in the HPI and as above.  Physical Exam: General: Alert, no acute distress Cardiovascular: No pedal edema Respiratory: No cyanosis, no use of accessory musculature GI: abdomen soft Skin: No lesions in the area of chief complaint Neurologic: Sensation intact distally Psychiatric: Patient is competent for consent with normal mood and affect Lymphatic: no lymphedema  MUSCULOSKELETAL: exam  stable  Assessment: left knee osteoarthritis  Plan: Plan for Procedure(s): LEFT TOTAL KNEE ARTHROPLASTY  The risks benefits and alternatives were discussed with the patient including but not limited to the risks of nonoperative treatment, versus surgical intervention including infection, bleeding, nerve injury,  blood clots, cardiopulmonary complications, morbidity, mortality, among others, and they were willing to proceed.   Glee Arvin, MD 02/08/2024 9:01 AM

## 2024-02-08 NOTE — Evaluation (Signed)
 Physical Therapy Evaluation Patient Details Name: Bridget [name] MRN: [id number] DOB: 28-Jul-1956 Today's Date: 02/08/2024  History of Present Illness  67 y.o. female presents to [facility] hospital on 02/08/2024 for elective L TKA. PMH includes anxiety, schizoaffective disorder, GERD, fibromyalgia, HLD, OA, insomnia, DMII.  Clinical Impression  Pt presents to PT with deficits in strength, power, gait, balance, functional mobility. Pt is able to transfer and ambulate for short household distances at this time. When utilizing the [facility] pt does become diaphoretic and reports lightheadedness. BP obtained in supine, 119/72. PT will continue to follow to progress ambulation tolerance and LLE strength/ROM.        If plan is discharge home, recommend the following: A little help with walking and/or transfers;A lot of help with bathing/dressing/bathroom;Assistance with cooking/housework;Assist for transportation;Help with stairs or ramp for entrance   Can travel by private vehicle        Equipment Recommendations Rolling walker (2 wheels);BSC/3in1  Recommendations for Other Services       Functional Status Assessment Patient has had a recent decline in their functional status and demonstrates the ability to make significant improvements in function in a reasonable and predictable amount of time.     Precautions / Restrictions Precautions Precautions: Fall;Knee Precaution Booklet Issued: Yes (comment) Recall of Precautions/Restrictions: Impaired Restrictions Weight Bearing Restrictions Per Provider Order: Yes LLE Weight Bearing Per Provider Order: Weight bearing as tolerated      Mobility  Bed Mobility Overal bed mobility: Needs Assistance Bed Mobility: Supine to Sit, Sit to Supine     Supine to sit: Contact guard Sit to supine: Contact guard assist        Transfers Overall transfer level: Needs assistance Equipment used: Rolling walker (2 wheels) Transfers: Sit to/from Stand, Bed to  chair/wheelchair/BSC Sit to Stand: Min assist   Step pivot transfers: Contact guard assist            Ambulation/Gait Ambulation/Gait assistance: Contact guard assist Gait Distance (Feet): 30 Feet Assistive device: Rolling walker (2 wheels) Gait Pattern/deviations: Step-to pattern Gait velocity: reduced Gait velocity interpretation: <1.31 ft/sec, indicative of household ambulator   General Gait Details: slowed step-to gait  Stairs            Wheelchair Mobility     Tilt Bed    Modified Rankin (Stroke Patients Only)       Balance Overall balance assessment: Needs assistance Sitting-balance support: No upper extremity supported, Feet supported Sitting balance-Leahy Scale: Fair     Standing balance support: Bilateral upper extremity supported, Reliant on assistive device for balance Standing balance-Leahy Scale: Poor                               Pertinent Vitals/Pain Pain Assessment Pain Assessment: 0-10 Pain Score: 8  Pain Location: L knee Pain Descriptors / Indicators: Grimacing Pain Intervention(s): Monitored during session    Home Living Family/patient expects to be discharged to:: Private residence Living Arrangements: Alone Available Help at Discharge: Family;Available PRN/intermittently Type of Home: House Home Access: Level entry       Home Layout: One level Home Equipment: Cane - single point;Shower seat Additional Comments: pt reports her plan is to go to rehab after discharging    Prior Function Prior Level of Function : Independent/Modified Independent             Mobility Comments: ambulatory without DME       Extremity/Trunk Assessment   Upper Extremity  Assessment Upper Extremity Assessment: Overall WFL for tasks assessed    Lower Extremity Assessment Lower Extremity Assessment: LLE deficits/detail LLE Deficits / Details: ROM and strength deficits as anticipated on POD 0 s/p TKA    Cervical / Trunk  Assessment Cervical / Trunk Assessment: Normal  Communication   Communication Communication: No apparent difficulties    Cognition Arousal: Alert Behavior During Therapy: WFL for tasks assessed/performed   PT - Cognitive impairments: Memory                       PT - Cognition Comments: poor short term memory during exercise review Following commands: Intact       Cueing Cueing Techniques: Verbal cues     General Comments General comments (skin integrity, edema, etc.): pt becomes diaphoretic and reports lightheadedness when sitting on BSC, BP upon return to supine is 119/72    Exercises Other Exercises Other Exercises: PT provides education on TKR exercise packet   Assessment/Plan    PT Assessment Patient needs continued PT services  PT Problem List Decreased strength;Decreased range of motion;Decreased activity tolerance;Decreased balance;Decreased mobility;Pain       PT Treatment Interventions DME instruction;Gait training;Functional mobility training;Therapeutic activities;Therapeutic exercise;Balance training;Neuromuscular re-education;Patient/family education    PT Goals (Current goals can be found in the Care Plan section)  Acute Rehab PT Goals Patient Stated Goal: to return to independence PT Goal Formulation: With patient Time For Goal Achievement: 02/12/24 Potential to Achieve Goals: Fair    Frequency 7X/week     Co-evaluation               AM-PAC PT "6 Clicks" Mobility  Outcome Measure Help needed turning from your back to your side while in a flat bed without using bedrails?: A Little Help needed moving from lying on your back to sitting on the side of a flat bed without using bedrails?: A Little Help needed moving to and from a bed to a chair (including a wheelchair)?: A Little Help needed standing up from a chair using your arms (e.g., wheelchair or bedside chair)?: A Little Help needed to walk in hospital room?: A Little Help needed  climbing 3-5 steps with a railing? : Total 6 Click Score: 16    End of Session Equipment Utilized During Treatment: Gait belt Activity Tolerance: Patient tolerated treatment well Patient left: in bed;with call bell/phone within reach;with bed alarm set Nurse Communication: Mobility status PT Visit Diagnosis: Other abnormalities of gait and mobility (R26.89);Muscle weakness (generalized) (M62.81)    Time: 1610-9604 PT Time Calculation (min) (ACUTE ONLY): 47 min   Charges:   PT Evaluation $PT Eval Low Complexity: 1 Low PT Treatments $Therapeutic Activity: 8-22 mins PT General Charges $$ ACUTE PT VISIT: 1 Visit         Arlyss Gandy, PT, DPT Acute Rehabilitation Office [phone]   Arlyss Gandy 02/08/2024, 5:35 PM

## 2024-02-09 ENCOUNTER — Encounter (HOSPITAL_COMMUNITY): Payer: Self-pay | Admitting: Orthopaedic Surgery

## 2024-02-09 ENCOUNTER — Other Ambulatory Visit (HOSPITAL_COMMUNITY): Payer: Self-pay

## 2024-02-09 DIAGNOSIS — M1712 Unilateral primary osteoarthritis, left knee: Secondary | ICD-10-CM | POA: Diagnosis not present

## 2024-02-09 MED ORDER — DOXYCYCLINE HYCLATE 100 MG PO CAPS: 100.0000 mg | ORAL_CAPSULE | Freq: Two times a day (BID) | ORAL | 0 refills | Status: DC

## 2024-02-09 MED ORDER — OXYCODONE HCL 5 MG PO TABS: 5.0000 mg | ORAL_TABLET | Freq: Four times a day (QID) | ORAL | 0 refills | Status: DC | PRN

## 2024-02-09 MED ORDER — APIXABAN 2.5 MG PO TABS: 2.5000 mg | ORAL_TABLET | Freq: Two times a day (BID) | ORAL | 0 refills | Status: DC

## 2024-02-09 NOTE — Plan of Care (Signed)

## 2024-02-09 NOTE — Discharge Summary (Signed)
 Patient ID: Bridget [name] MRN: [id number] DOB/AGE: [birth year] 68 y.o.  Admit date: 02/08/2024 Discharge date: 02/10/2024  Admission Diagnoses:  Principal Problem:   Primary osteoarthritis of left knee Active Problems:   Status post total left knee replacement   Discharge Diagnoses:  Same  Past Medical History:  Diagnosis Date   Acute sinusitis 02/06/2016   Age-related nuclear cataract of both eyes 01/26/2017   Allergic rhinitis due to pollen 02/07/2016   Anxiety    Auditory hallucinations 07/08/2016   Back pain    low back - arthritis   Barrett's esophagus    Bilateral presbyopia 01/26/2017   Brachial neuritis or radiculitis 02/06/2016   Bursitis of left hip 02/06/2016   Cervical neuritis 02/06/2016   Chronic bilateral low back pain without sciatica 06/25/2023   Chronic headaches    Complete tear of right rotator cuff 10/30/2021   Constipation    miralax daily   Corneal dystrophy, anterior 01/26/2017   Cortical senile cataract of both eyes 01/26/2017   DDD (degenerative disc disease), cervical 02/07/2016   DDD (degenerative disc disease), lumbosacral 01/03/2014   Depression    Dermatitis, eczematoid 02/06/2016   Disorder of bursae and tendons in shoulder region 02/06/2016   Dry mouth    biotene   Dysphagia 11/02/2022   Enthesopathy of ankle and tarsus 02/06/2016   Fainting 02/06/2016   Fibromyalgia    GAD (generalized anxiety disorder) 01/24/2020   Generalized osteoarthrosis 09/15/2013   GERD (gastroesophageal reflux disease)    Glossitis 02/07/2016   Heart murmur    Hyperlipemia    Hypertension    Insomnia    Joint pain, knee 02/07/2016   Menopausal disorder 02/07/2016   Migraine headache 02/07/2016   Morbid obesity (HCC) 02/07/2016   Myalgia 02/07/2016   Myoclonus    patient states "no longer an issue"   Myositis 02/07/2016   Obesity 02/07/2016   Osteoarthritis    lower back, shoulders, hands   Periodic limb movement disorder 02/07/2016    Plantar fasciitis 02/07/2016   Primary osteoarthritis of left knee 01/28/2022   Primary osteoarthritis of right knee 01/28/2022   Referred otalgia 02/07/2016   Restless leg syndrome 02/07/2016   Schizoaffective disorder (HCC)    Schizoaffective disorder, bipolar type (HCC) 02/04/2018   Shoulder pain 02/07/2016   SVD (spontaneous vaginal delivery)    x 3   Toxic effect of secondhand tobacco smoke 02/07/2016   Vaginal atrophy 02/07/2016   Vertigo    Visual hallucination 07/08/2016   Vulvar atrophy 02/07/2016   Wears dentures    full    Surgeries: Procedure(s): LEFT TOTAL KNEE REPLACEMENT on 02/08/2024   Consultants:   Discharged Condition: Improved  Hospital Course: JAMISON YUHASZ is an 68 y.o. female who was admitted 02/08/2024 for operative treatment ofPrimary osteoarthritis of left knee. Patient has severe unremitting pain that affects sleep, daily activities, and work/hobbies. After pre-op clearance the patient was taken to the operating room on 02/08/2024 and underwent  Procedure(s): LEFT TOTAL KNEE REPLACEMENT.    Patient was given perioperative antibiotics:  Anti-infectives (From admission, onward)    Start     Dose/Rate Route Frequency Ordered Stop   02/09/24 1000  doxycycline (VIBRA-TABS) tablet 100 mg        100 mg Oral 2 times daily 02/08/24 1425     02/09/24 0000  doxycycline (VIBRAMYCIN) 100 MG capsule        100 mg Oral 2 times daily 02/09/24 0849     02/08/24 1700  ceFAZolin (ANCEF)  IVPB 2g/100 mL premix        2 g 200 mL/hr over 30 Minutes Intravenous Every 6 hours 02/08/24 1425 02/09/24 0027   02/08/24 1122  vancomycin (VANCOCIN) powder  Status:  Discontinued          As needed 02/08/24 1122 02/08/24 1305   02/08/24 0915  ceFAZolin (ANCEF) IVPB 2g/100 mL premix        2 g 200 mL/hr over 30 Minutes Intravenous On call to O.R. 02/08/24 0907 02/08/24 1105        Patient was given sequential compression devices, early ambulation, and chemoprophylaxis to  prevent DVT.  Patient benefited maximally from hospital stay and there were no complications.    Recent vital signs: Patient Vitals for the past 24 hrs:  BP Temp Temp src Pulse Resp SpO2  02/10/24 0728 (!) 100/49 99.2 F (37.3 C) Oral 95 18 98 %  02/10/24 0535 (!) 140/62 98.9 F (37.2 C) Oral 86 16 97 %  02/09/24 1958 (!) 129/59 98.9 F (37.2 C) Oral (!) 104 16 92 %  02/09/24 1127 135/67 98.5 F (36.9 C) Oral 66 17 100 %     Recent laboratory studies: No results for input(s): "WBC", "HGB", "HCT", "PLT", "NA", "K", "CL", "CO2", "BUN", "CREATININE", "GLUCOSE", "INR", "CALCIUM" in the last 72 hours.  Invalid input(s): "PT", "2"   Discharge Medications:   Allergies as of 02/10/2024       Reactions   Flexeril [cyclobenzaprine] Other (See Comments)   Urinary retention   Amitriptyline Other (See Comments)   Urinary retention   Penicillins Rash   Has patient had a PCN reaction causing immediate rash, facial/tongue/throat swelling, SOB or lightheadedness with hypotension: yes Has patient had a PCN reaction causing severe rash involving mucus membranes or skin necrosis: no Has patient had a PCN reaction that required hospitalization: No Has patient had a PCN reaction occurring within the last 10 years: No If all of the above answers are "NO", then may proceed with Cephalosporin use.        Medication List     STOP taking these medications    traMADol 50 MG tablet Commonly known as: ULTRAM   TYLENOL 500 MG tablet Generic drug: acetaminophen       TAKE these medications    apixaban 2.5 MG Tabs tablet Commonly known as: Eliquis Take one tablet by mouth twice daily.  To be taken after surgery to prevent blood clots   ARIPiprazole 20 MG tablet Commonly known as: ABILIFY Take 20 mg by mouth at bedtime.   Belsomra 20 MG Tabs Generic drug: Suvorexant Take 20 mg by mouth at bedtime. What changed: Another medication with the same name was removed. Continue taking this  medication, and follow the directions you see here.   Calcium 600+D3 600-10 MG-MCG Tabs Generic drug: Calcium Carb-Cholecalciferol Take 1 tablet by mouth in the morning and at bedtime.   Cequa 0.09 % Soln Generic drug: cycloSPORINE (PF) Place 1 drop into both eyes every 12 (twelve) hours.   doxycycline 100 MG capsule Commonly known as: Vibramycin Take 1 capsule (100 mg total) by mouth 2 (two) times daily. To be taken after surgery   DULoxetine 60 MG capsule Commonly known as: CYMBALTA Take 120 mg by mouth in the morning.   gabapentin 100 MG capsule Commonly known as: NEURONTIN Take 100-200 mg by mouth at bedtime.   Ingrezza 80 MG capsule Generic drug: valbenazine Take 80 mg by mouth daily. Movements in the face  latanoprost 0.005 % ophthalmic solution Commonly known as: XALATAN Place 1 drop into both eyes at bedtime.   lidocaine 5 % Commonly known as: LIDODERM Place 1 patch onto the skin daily as needed (for lower back pain- Remove & Discard patch within 12 hours or as directed by MD).   methocarbamol 750 MG tablet Commonly known as: Robaxin-750 Take 1 tablet (750 mg total) by mouth 2 (two) times daily as needed for muscle spasms.   metoprolol tartrate 25 MG tablet Commonly known as: LOPRESSOR Take 12.5 mg by mouth 2 (two) times daily.   oxyCODONE 5 MG immediate release tablet Commonly known as: Oxy IR/ROXICODONE Take 1-2 tablets (5-10 mg total) by mouth every 6 (six) hours as needed for moderate pain (pain score 4-6) (pain score 4-6).   pantoprazole 40 MG tablet Commonly known as: PROTONIX Take 40 mg by mouth daily before breakfast.   polyethylene glycol 17 g packet Commonly known as: MIRALAX / GLYCOLAX Take 17 g by mouth daily.   QUEtiapine 100 MG tablet Commonly known as: SEROQUEL Take 100 mg by mouth at bedtime.   rosuvastatin 20 MG tablet Commonly known as: CRESTOR Take 20 mg by mouth daily.   senna 8.6 MG Tabs tablet Commonly known as:  SENOKOT Take 8.6 mg by mouth at bedtime.   temazepam 15 MG capsule Commonly known as: RESTORIL Take 15 mg by mouth at bedtime. What changed: Another medication with the same name was removed. Continue taking this medication, and follow the directions you see here.               Durable Medical Equipment  (From admission, onward)           Start     Ordered   02/08/24 1426  DME Walker rolling  Once       Question Answer Comment  Walker: With 5 Inch Wheels   Patient needs a walker to treat with the following condition Status post left partial knee replacement      02/08/24 1425   02/08/24 1426  DME 3 n 1  Once        02/08/24 1425   02/08/24 1426  DME Bedside commode  Once       Question:  Patient needs a bedside commode to treat with the following condition  Answer:  Status post left partial knee replacement   02/08/24 1425            Diagnostic Studies: DG Knee Left Port Result Date: 02/08/2024 CLINICAL DATA:  Pain EXAM: PORTABLE LEFT KNEE - 1-2 VIEW COMPARISON:  Left knee x-ray 06/06/2023 FINDINGS: There is a new left knee total arthroplasty in anatomic alignment. There is no acute fracture or dislocation. There is soft tissue swelling and air surrounding the knee compatible with recent surgery. IMPRESSION: New left knee total arthroplasty in anatomic alignment. Electronically Signed   By: Darliss Cheney M.D.   On: 02/08/2024 17:23    Disposition: Discharge disposition: 03-Skilled Nursing Facility          Contact information for follow-up providers     Cristie Hem, PA-C. Schedule an appointment as soon as possible for a visit in 1 week(s).   Specialty: Orthopedic Surgery Contact information: [address] [place] [address] [phone]              Contact information for after-discharge care     Destination     [facility] [place] [facility] .   Service: Skilled Nursing  Contact information: [address] [place] [address] [phone]                      Signed: Cristie Hem 02/10/2024, 8:07 AM

## 2024-02-09 NOTE — TOC Initial Note (Signed)
 Transition of Care [facility]) - Initial/Assessment Note    Patient Details  Name: Bridget [name] MRN: [id number] Date of Birth: Apr 04, 1956  Transition of Care [facility]) CM/SW Contact:    Lorri Frederick, LCSW Phone Number: 02/09/2024, 3:15 PM  Clinical Narrative:      CSW received call from [facility] [phone] earlier today that pt is approved for admission to [facility].  CSW confirmed with [facility] that they are ready to receive pt today.  CSW met with pt, who also confirms plan to DC to [facility] today.  Pt from home alone, [facility] services. Permission given to send paperwork to [place].    CSW spoke with daughter [name] regarding DC today.  She had some questions about pain level, RN informed.  Also discussed/explained Moon with [name].  PASSR requires additional information, which has been uploaded to [facility].               Expected Discharge Plan: Skilled Nursing Facility Barriers to Discharge: Other (must enter comment) (PASSR still pending)   Patient Goals and CMS Choice Patient states their goals for this hospitalization and ongoing recovery are:: get rid of this pain   Choice offered to / list presented to : Patient, Adult Children (daughter [name])      Expected Discharge Plan and Services In-house Referral: Clinical Social Work   Post Acute Care Choice: Skilled Nursing Facility Living arrangements for the past 2 months: Single Family Home Expected Discharge Date: 02/09/24                                    Prior Living Arrangements/Services Living arrangements for the past 2 months: Single Family Home Lives with:: Self Patient language and need for interpreter reviewed:: Yes Do you feel safe going back to the place where you live?: Yes      Need for Family Participation in Patient Care: Yes (Comment) Care giver support system in place?: Yes (comment) Current home services: Other (comment) (PACE) Criminal Activity/Legal Involvement  Pertinent to Current Situation/Hospitalization: No - Comment as needed  Activities of Daily Living   ADL Screening (condition at time of admission) Is the patient deaf or have difficulty hearing?: No Does the patient have difficulty seeing, even when wearing glasses/contacts?: No Does the patient have difficulty concentrating, remembering, or making decisions?: Yes  Permission Sought/Granted Permission sought to share information with : Family Supports Permission granted to share information with : Yes, Verbal Permission Granted  Share Information with NAME: daughters [name], Rene Kocher  Permission granted to share info w AGENCY: [facility]        Emotional Assessment Appearance:: Appears stated age Attitude/Demeanor/Rapport: Engaged Affect (typically observed): Appropriate Orientation: : Oriented to Self, Oriented to Place, Oriented to  Time, Oriented to Situation      Admission diagnosis:  Status post total left knee replacement [Z96.652] Patient Active Problem List   Diagnosis Date Noted   Status post total left knee replacement 02/08/2024   Angina pectoris (HCC) 09/03/2023   Cardiac murmur 09/03/2023   Anxiety    Back pain    Barrett's esophagus    Chronic headaches    Constipation    Depression    Dry mouth    Fibromyalgia    GERD (gastroesophageal reflux disease)    Heart murmur    Hyperlipemia    Insomnia    Myoclonus    Osteoarthritis    Schizoaffective disorder (HCC)  SVD (spontaneous vaginal delivery)    Vertigo    Wears dentures    Chronic bilateral low back pain without sciatica 06/25/2023   Dysphagia 11/02/2022   Primary osteoarthritis of right knee 01/28/2022   Primary osteoarthritis of left knee 01/28/2022   Complete tear of right rotator cuff 10/30/2021   GAD (generalized anxiety disorder) 01/24/2020   Schizoaffective disorder, bipolar type (HCC) 02/04/2018   Age-related nuclear cataract of both eyes 01/26/2017   Cortical senile cataract of both  eyes 01/26/2017   Bilateral presbyopia 01/26/2017   Corneal dystrophy, anterior 01/26/2017   Auditory hallucinations 07/08/2016   Visual hallucination 07/08/2016   Allergic rhinitis due to pollen 02/07/2016   Glossitis 02/07/2016   Myositis 02/07/2016   Plantar fasciitis 02/07/2016   DDD (degenerative disc disease), cervical 02/07/2016   Joint pain, knee 02/07/2016   Menopausal disorder 02/07/2016   Morbid obesity (HCC) 02/07/2016   Obesity 02/07/2016   Migraine headache 02/07/2016   Myalgia 02/07/2016   Periodic limb movement disorder 02/07/2016   Restless leg syndrome 02/07/2016   Referred otalgia 02/07/2016   Shoulder pain 02/07/2016   Toxic effect of secondhand tobacco smoke 02/07/2016   Vaginal atrophy 02/07/2016   Vulvar atrophy 02/07/2016   Acute sinusitis 02/06/2016   Brachial neuritis or radiculitis 02/06/2016   Cervical neuritis 02/06/2016   Bursitis of left hip 02/06/2016   Dermatitis, eczematoid 02/06/2016   Disorder of bursae and tendons in shoulder region 02/06/2016   Enthesopathy of ankle and tarsus 02/06/2016   Fainting 02/06/2016   Type II diabetes mellitus (HCC) 04/07/2014   DDD (degenerative disc disease), lumbosacral 01/03/2014   Generalized osteoarthrosis 09/15/2013   PCP:  Kermit Balo, DO Pharmacy:   [id number] - [place], [place] - [address] [place] [address] Phone: [phone] Fax: [phone]  [facility] [address] [place] [address] Phone: [phone] Fax: [phone]     Social Drivers of Health (SDOH) Social History: SDOH Screenings   Food Insecurity: No Food Insecurity (02/08/2024)  Housing: Low Risk  (02/08/2024)  Transportation Needs: No Transportation Needs (02/08/2024)  Utilities: Not At Risk (02/08/2024)  Depression (PHQ2-9): Low Risk  (06/25/2023)  Social Connections: Moderately Integrated (02/08/2024)  Tobacco Use: Low Risk   (02/08/2024)   SDOH Interventions:     Readmission Risk Interventions     No data to display

## 2024-02-09 NOTE — Progress Notes (Addendum)
 RE:      Bridget [name]                                                          Date of Birth:             June 04, 2056                                                 Date:               02/09/24                                                     To Whom It May Concern:   Please be advised that the above-named patient will require a short-term nursing home stay - anticipated 30 days or less for rehabilitation and strengthening.  The plan is for return home.                 Dr Jacklynn Barnacle, MD                                                                                                                                         MD signature   02/09/24                                                                                                                                                             Date

## 2024-02-09 NOTE — Progress Notes (Signed)
 Patient agreeable to participate with therapy. Session focused on improving functional activity tolerance with provided cues to improve gait when using a RW. Patient able to transition from a sit > stand using a RW, and ambulated 113ft with a RW within the unit. All transfers and gait training required CGA to ensure safety. Verbal cues provided for hand placement when transitioning from a sit > stand, additionally, the patient demonstrates safe use of RW when ambulating within the unit. Cues to maintain upright posture when ambulating and increase gait speed with RW. Following functional activities, ended session with cryotherapy to modulate pain. Patient will benefit from continued inpatient follow up therapy, <3 hours/day to maximize functional level of independence.    02/09/24 1445  PT Visit Information  Last PT Received On 02/09/24  Assistance Needed +1  History of Present Illness 68 y.o. female presents to [facility] hospital on 02/08/2024 for elective L TKA. PMH includes anxiety, schizoaffective disorder, GERD, fibromyalgia, HLD, OA, insomnia, DMII.  Subjective Data  Subjective Patient agreeable to participate with therapy  Patient Stated Goal to return to independence  Precautions  Precautions Fall;Knee  Restrictions  Weight Bearing Restrictions Per Provider Order Yes  LLE Weight Bearing Per Provider Order WBAT  Pain Assessment  Pain Assessment Faces  Faces Pain Scale 4  Pain Location L knee, L lower abdomen  Pain Descriptors / Indicators Aching  Pain Intervention(s) Monitored during session;Ice applied  Cognition  Arousal Alert  Behavior During Therapy WFL for tasks assessed/performed  Following Commands  Following commands Intact  Cueing  Cueing Techniques Verbal cues;Tactile cues  Communication  Communication No apparent difficulties  Transfers  Overall transfer level Needs assistance  Equipment used Rolling walker (2 wheels)  Transfers Sit to/from Stand  Sit to Stand Contact  guard assist  General transfer comment CGA, increased time and effort for STS. Cues provided for hand placement  Ambulation/Gait  Ambulation/Gait assistance Contact guard assist  Gait Distance (Feet) 115 Feet  Assistive device Rolling walker (2 wheels)  Gait Pattern/deviations Step-to pattern;Decreased step length - left;Decreased dorsiflexion - left;Decreased stride length  General Gait Details slowed step-to gait, cues to maintain upright posture and increase velocity when using a RW  Gait velocity reduced  Balance  Overall balance assessment Needs assistance  Sitting-balance support No upper extremity supported;Feet supported  Sitting balance-Leahy Scale Fair  Standing balance support Bilateral upper extremity supported;Reliant on assistive device for balance  Standing balance-Leahy Scale Fair  Standing balance comment Reliant on AD to maintain balance  PT - End of Session  Equipment Utilized During Treatment Gait belt  Activity Tolerance Patient tolerated treatment well  Patient left in chair;with call bell/phone within reach;with nursing/sitter in room   PT - Assessment/Plan  PT Visit Diagnosis Other abnormalities of gait and mobility (R26.89);Muscle weakness (generalized) (M62.81)  PT Frequency (ACUTE ONLY) 7X/week  Follow Up Recommendations Follow physician's recommendations for discharge plan and follow up therapies  Patient can return home with the following A little help with walking and/or transfers;A lot of help with bathing/dressing/bathroom;Assistance with cooking/housework;Assist for transportation;Help with stairs or ramp for entrance  PT equipment Rolling walker (2 wheels);BSC/3in1  AM-PAC PT "6 Clicks" Mobility Outcome Measure (Version 2)  Help needed turning from your back to your side while in a flat bed without using bedrails? 3  Help needed moving from lying on your back to sitting on the side of a flat bed without using bedrails? 3  Help needed moving to and from a  bed to a  chair (including a wheelchair)? 3  Help needed standing up from a chair using your arms (e.g., wheelchair or bedside chair)? 3  Help needed to walk in hospital room? 3  Help needed climbing 3-5 steps with a railing?  1  6 Click Score 16  Consider Recommendation of Discharge To: Home with [facility]  Progressive Mobility  What is the highest level of mobility based on the progressive mobility assessment? Level 5 (Walks with assist in room/hall) - Balance while stepping forward/back and can walk in room with assist - Complete  Activity Ambulated with assistance in hallway;Ambulated with assistance in room  PT Goal Progression  Progress towards PT goals Progressing toward goals  Acute Rehab PT Goals  PT Goal Formulation With patient  Time For Goal Achievement 02/12/24  Potential to Achieve Goals Good  PT Time Calculation  PT Start Time (ACUTE ONLY) 1414  PT Stop Time (ACUTE ONLY) 1451  PT Time Calculation (min) (ACUTE ONLY) 37 min   Doreen Beam, SPT

## 2024-02-09 NOTE — Evaluation (Signed)
 Occupational Therapy Evaluation Patient Details Name: Bridget [name] MRN: [id number] DOB: Aug 28, 1956 Today's Date: 02/09/2024   History of Present Illness   68 y.o. female presents to [facility] hospital on 02/08/2024 for elective L TKA. PMH includes anxiety, schizoaffective disorder, GERD, fibromyalgia, HLD, OA, insomnia, DMII.     Clinical Impressions Pt c/o pain to L knee 9/10, and L lower abdomen. Pt states pain in abdomen has occurred for months off and on daily, but PCP has not done anything when mentioning it. Pt lives alone, states she does not have assistance at home and plans to go to rehab facility prior to home. Pt PLOF independent, currently requires mod A for LB ADLs, set up for UB ADLs, CGA for mobility with increased time/effort. Pt requires increased time to respond, but able to fully participate in session. Pt would benefit from RW and [facility], but planning to go to postacute rehab first. Will continue to see Pt acutely to progress as able, follow physicians recommendations for follow up.     If plan is discharge home, recommend the following:   A lot of help with walking and/or transfers;A lot of help with bathing/dressing/bathroom;Assistance with cooking/housework;Assist for transportation;Help with stairs or ramp for entrance     Functional Status Assessment   Patient has had a recent decline in their functional status and demonstrates the ability to make significant improvements in function in a reasonable and predictable amount of time.     Equipment Recommendations   BSC/3in1;Other (comment) (RW)     Recommendations for Other Services         Precautions/Restrictions   Precautions Precautions: Fall;Knee Precaution Booklet Issued: Yes (comment) Recall of Precautions/Restrictions: Impaired Restrictions Weight Bearing Restrictions Per Provider Order: Yes LLE Weight Bearing Per Provider Order: Weight bearing as tolerated     Mobility Bed Mobility Overal  bed mobility: Needs Assistance Bed Mobility: Supine to Sit     Supine to sit: Contact guard     General bed mobility comments: CGA for safety, increased time    Transfers Overall transfer level: Needs assistance Equipment used: Rolling walker (2 wheels) Transfers: Sit to/from Stand, Bed to chair/wheelchair/BSC Sit to Stand: Contact guard assist     Step pivot transfers: Contact guard assist     General transfer comment: CGA, increased time and effort for STS      Balance Overall balance assessment: Needs assistance Sitting-balance support: No upper extremity supported, Feet supported Sitting balance-Leahy Scale: Fair     Standing balance support: Bilateral upper extremity supported, Reliant on assistive device for balance Standing balance-Leahy Scale: Fair Standing balance comment: able to stand unsupported at sink performing grooming                           ADL either performed or assessed with clinical judgement   ADL Overall ADL's : Needs assistance/impaired Eating/Feeding: Independent   Grooming: Contact guard assist;Standing   Upper Body Bathing: Set up;Sitting   Lower Body Bathing: Moderate assistance;Sitting/lateral leans   Upper Body Dressing : Set up;Sitting   Lower Body Dressing: Moderate assistance;Sitting/lateral leans;Sit to/from stand   Toilet Transfer: Contact guard assist;Rolling walker (2 wheels)   Toileting- Clothing Manipulation and Hygiene: Set up;Sitting/lateral lean       Functional mobility during ADLs: Contact guard assist;Rolling walker (2 wheels) General ADL Comments: Pt requires assistance for LB ADLs, set up for all ADLs, and CGA for mobility     Vision Baseline Vision/History: 1 Wears  glasses Ability to See in Adequate Light: 0 Adequate Patient Visual Report: No change from baseline       Perception         Praxis         Pertinent Vitals/Pain Pain Assessment Pain Assessment: 0-10 Pain Score: 9  Pain  Location: L knee, L lower abdomen Pain Descriptors / Indicators: Aching Pain Intervention(s): Monitored during session     Extremity/Trunk Assessment Upper Extremity Assessment Upper Extremity Assessment: Overall WFL for tasks assessed           Communication Communication Communication: No apparent difficulties   Cognition Arousal: Alert Behavior During Therapy: WFL for tasks assessed/performed Cognition: No apparent impairments             OT - Cognition Comments: increased time for response, grossly WFLs                 Following commands: Intact       Cueing  General Comments   Cueing Techniques: Verbal cues      Exercises     Shoulder Instructions      Home Living Family/patient expects to be discharged to:: Private residence Living Arrangements: Alone Available Help at Discharge: Family;Available PRN/intermittently Type of Home: House Home Access: Level entry     Home Layout: One level     Bathroom Shower/Tub: Tub/shower unit         Home Equipment: Cane - single point;Shower seat   Additional Comments: Pt lives alone, plans to go to rehab after DC      Prior Functioning/Environment Prior Level of Function : Independent/Modified Independent             Mobility Comments: ambulatory without DME      OT Problem List: Decreased strength;Decreased range of motion;Decreased activity tolerance;Impaired balance (sitting and/or standing);Pain;Increased edema   OT Treatment/Interventions: Self-care/ADL training;Therapeutic exercise;Energy conservation;DME and/or AE instruction;Therapeutic activities;Balance training      OT Goals(Current goals can be found in the care plan section)   Acute Rehab OT Goals Patient Stated Goal: to go to rehab facility prior to returning home OT Goal Formulation: With patient Time For Goal Achievement: 02/23/24 Potential to Achieve Goals: Good   OT Frequency:  Min 1X/week    Co-evaluation               AM-PAC OT "6 Clicks" Daily Activity     Outcome Measure Help from another person eating meals?: None Help from another person taking care of personal grooming?: A Little Help from another person toileting, which includes using toliet, bedpan, or urinal?: A Little Help from another person bathing (including washing, rinsing, drying)?: A Lot Help from another person to put on and taking off regular upper body clothing?: A Little Help from another person to put on and taking off regular lower body clothing?: A Lot 6 Click Score: 17   End of Session Equipment Utilized During Treatment: Gait belt;Rolling walker (2 wheels) Nurse Communication: Mobility status  Activity Tolerance: Patient tolerated treatment well Patient left: in chair;with call bell/phone within reach;with nursing/sitter in room  OT Visit Diagnosis: Unsteadiness on feet (R26.81);Other abnormalities of gait and mobility (R26.89);Muscle weakness (generalized) (M62.81);Pain Pain - Right/Left: Left Pain - part of body: Knee                Time: 8469-6295 OT Time Calculation (min): 34 min Charges:  OT General Charges $OT Visit: 1 Visit OT Evaluation $OT Eval Moderate Complexity: 1 Mod OT Treatments $Self Care/Home  Management : 8-22 mins  [facility], OTR/L   Alexis Goodell 02/09/2024, 9:49 AM

## 2024-02-09 NOTE — Progress Notes (Addendum)
 Subjective: 1 Day Post-Op Procedure(s) (LRB): LEFT TOTAL KNEE REPLACEMENT (Left) Patient reports pain as mild.  No other complaints  Objective: Vital signs in last 24 hours: Temp:  [97.5 F (36.4 C)-98.7 F (37.1 C)] 97.7 F (36.5 C) (02/25 0734) Pulse Rate:  [60-82] 82 (02/25 0734) Resp:  [9-22] 19 (02/25 0734) BP: (94-171)/(58-106) 113/61 (02/25 0734) SpO2:  [92 %-100 %] 98 % (02/25 0734) Weight:  [100.7 kg] 100.7 kg (02/24 0919)  Intake/Output from previous day: 02/24 0701 - 02/25 0700 In: 620 [P.O.:120; IV Piggyback:500] Out: 150 [Blood:150] Intake/Output this shift: Total I/O In: 120 [P.O.:120] Out: 0   No results for input(s): "HGB" in the last 72 hours. No results for input(s): "WBC", "RBC", "HCT", "PLT" in the last 72 hours. No results for input(s): "NA", "K", "CL", "CO2", "BUN", "CREATININE", "GLUCOSE", "CALCIUM" in the last 72 hours. No results for input(s): "LABPT", "INR" in the last 72 hours.  Neurologically intact Neurovascular intact Sensation intact distally Intact pulses distally Dorsiflexion/Plantar flexion intact Incision: dressing C/D/I No cellulitis present Compartment soft Ivac in place with good seal.  No fluid in canister   Assessment/Plan: 1 Day Post-Op Procedure(s) (LRB): LEFT TOTAL KNEE REPLACEMENT (Left) Advance diet Up with therapy D/C IV fluids Discharge to SNF once insurance approves and bed available.  Patient prefers [facility]. WBAT LLE Will need to swap out hospital ivac unit for prevena unit day of discharge       Cristie Hem 02/09/2024, 8:46 AM

## 2024-02-09 NOTE — Care Management Obs Status (Signed)
 MEDICARE OBSERVATION STATUS NOTIFICATION   Patient Details  Name: Bridget [name] MRN: [id number] Date of Birth: [birth year]   Medicare Observation Status Notification Given:  Yes    Lorri Frederick, LCSW 02/09/2024, 1:50 PM

## 2024-02-09 NOTE — [place] FL2 (Signed)
 [place] MEDICAID FL2 LEVEL OF CARE FORM     IDENTIFICATION  Patient Name: Bridget [name] Birthdate: [birth year] Sex: female Admission Date (Current Location): 02/08/2024  [facility] and [place] Number:  Producer, television/film/video and Address:  [facility] [place]. [facility], [address], [place], [place] [address]      Provider Number: [id number]  Attending Physician Name and Address:  Tarry Kos, MD  Relative Name and Phone Number:  Marcelene Butte Daughter [phone]    Current Level of Care: Hospital Recommended Level of Care: Skilled Nursing Facility Prior Approval Number:    Date Approved/Denied:   PASRR Number:    Discharge Plan: SNF    Current Diagnoses: Patient Active Problem List   Diagnosis Date Noted   Status post total left knee replacement 02/08/2024   Angina pectoris (HCC) 09/03/2023   Cardiac murmur 09/03/2023   Anxiety    Back pain    Barrett's esophagus    Chronic headaches    Constipation    Depression    Dry mouth    Fibromyalgia    GERD (gastroesophageal reflux disease)    Heart murmur    Hyperlipemia    Insomnia    Myoclonus    Osteoarthritis    Schizoaffective disorder (HCC)    SVD (spontaneous vaginal delivery)    Vertigo    Wears dentures    Chronic bilateral low back pain without sciatica 06/25/2023   Dysphagia 11/02/2022   Primary osteoarthritis of right knee 01/28/2022   Primary osteoarthritis of left knee 01/28/2022   Complete tear of right rotator cuff 10/30/2021   GAD (generalized anxiety disorder) 01/24/2020   Schizoaffective disorder, bipolar type (HCC) 02/04/2018   Age-related nuclear cataract of both eyes 01/26/2017   Cortical senile cataract of both eyes 01/26/2017   Bilateral presbyopia 01/26/2017   Corneal dystrophy, anterior 01/26/2017   Auditory hallucinations 07/08/2016   Visual hallucination 07/08/2016   Allergic rhinitis due to pollen 02/07/2016   Glossitis 02/07/2016   Myositis 02/07/2016    Plantar fasciitis 02/07/2016   DDD (degenerative disc disease), cervical 02/07/2016   Joint pain, knee 02/07/2016   Menopausal disorder 02/07/2016   Morbid obesity (HCC) 02/07/2016   Obesity 02/07/2016   Migraine headache 02/07/2016   Myalgia 02/07/2016   Periodic limb movement disorder 02/07/2016   Restless leg syndrome 02/07/2016   Referred otalgia 02/07/2016   Shoulder pain 02/07/2016   Toxic effect of secondhand tobacco smoke 02/07/2016   Vaginal atrophy 02/07/2016   Vulvar atrophy 02/07/2016   Acute sinusitis 02/06/2016   Brachial neuritis or radiculitis 02/06/2016   Cervical neuritis 02/06/2016   Bursitis of left hip 02/06/2016   Dermatitis, eczematoid 02/06/2016   Disorder of bursae and tendons in shoulder region 02/06/2016   Enthesopathy of ankle and tarsus 02/06/2016   Fainting 02/06/2016   Type II diabetes mellitus (HCC) 04/07/2014   DDD (degenerative disc disease), lumbosacral 01/03/2014   Generalized osteoarthrosis 09/15/2013    Orientation RESPIRATION BLADDER Height & Weight     Self, Time, Situation, Place  Normal Continent Weight: 222 lb (100.7 kg) Height:  5\' 4"  (162.6 cm)  BEHAVIORAL SYMPTOMS/MOOD NEUROLOGICAL BOWEL NUTRITION STATUS      Continent Diet (see discharge summary)  AMBULATORY STATUS COMMUNICATION OF NEEDS Skin   Limited Assist Verbally Surgical wounds                       Personal Care Assistance Level of Assistance  Bathing, Feeding, Dressing Bathing Assistance:  Limited assistance Feeding assistance: Independent Dressing Assistance: Limited assistance     Functional Limitations Info  Sight, Hearing, Speech Sight Info: Adequate Hearing Info: Adequate Speech Info: Adequate    SPECIAL CARE FACTORS FREQUENCY  PT (By licensed PT), OT (By licensed OT)     PT Frequency: 5x week OT Frequency: 5x week            Contractures Contractures Info: Not present    Additional Factors Info  Code Status, Allergies Code Status Info:  full Allergies Info: Flexeril (Cyclobenzaprine), Amitriptyline, Penicillins           Current Medications (02/09/2024):  This is the current hospital active medication list Current Facility-Administered Medications  Medication Dose Route Frequency Provider Last Rate Last Admin   acetaminophen (TYLENOL) tablet 325-650 mg  325-650 mg Oral Q6H PRN Tarry Kos, MD       apixaban Everlene Balls) tablet 2.5 mg  2.5 mg Oral Q12H Tarry Kos, MD   2.5 mg at 02/09/24 0813   ARIPiprazole (ABILIFY) tablet 20 mg  20 mg Oral QHS Tarry Kos, MD   20 mg at 02/08/24 2102   docusate sodium (COLACE) capsule 100 mg  100 mg Oral BID Tarry Kos, MD   100 mg at 02/09/24 0944   doxycycline (VIBRA-TABS) tablet 100 mg  100 mg Oral BID Tarry Kos, MD   100 mg at 02/09/24 0454   ferrous sulfate tablet 325 mg  325 mg Oral Daily Tarry Kos, MD   325 mg at 02/09/24 0981   HYDROmorphone (DILAUDID) injection 0.5-1 mg  0.5-1 mg Intravenous Q4H PRN Tarry Kos, MD   1 mg at 02/09/24 1914   menthol-cetylpyridinium (CEPACOL) lozenge 3 mg  1 lozenge Oral PRN Tarry Kos, MD       Or   phenol (CHLORASEPTIC) mouth spray 1 spray  1 spray Mouth/Throat PRN Tarry Kos, MD       methocarbamol (ROBAXIN) tablet 500 mg  500 mg Oral Q6H PRN Tarry Kos, MD   500 mg at 02/09/24 7829   Or   methocarbamol (ROBAXIN) injection 500 mg  500 mg Intravenous Q6H PRN Tarry Kos, MD       metoCLOPramide (REGLAN) tablet 5-10 mg  5-10 mg Oral Q8H PRN Tarry Kos, MD       Or   metoCLOPramide (REGLAN) injection 5-10 mg  5-10 mg Intravenous Q8H PRN Tarry Kos, MD       metoprolol tartrate (LOPRESSOR) tablet 12.5 mg  12.5 mg Oral BID Tarry Kos, MD   12.5 mg at 02/08/24 2103   ondansetron (ZOFRAN) tablet 4 mg  4 mg Oral Q6H PRN Tarry Kos, MD       Or   ondansetron [facility]) injection 4 mg  4 mg Intravenous Q6H PRN Tarry Kos, MD       oxyCODONE (Oxy IR/ROXICODONE) immediate release tablet 10-15 mg  10-15 mg Oral Q4H  PRN Tarry Kos, MD   15 mg at 02/09/24 0251   oxyCODONE (Oxy IR/ROXICODONE) immediate release tablet 5-10 mg  5-10 mg Oral Q4H PRN Tarry Kos, MD   10 mg at 02/09/24 1156   QUEtiapine (SEROQUEL) tablet 100 mg  100 mg Oral QHS Tarry Kos, MD   100 mg at 02/08/24 2103   valbenazine (INGREZZA) capsule 80 mg  80 mg Oral Daily Tarry Kos, MD   80 mg at 02/09/24 [phone]  Discharge Medications: Please see discharge summary for a list of discharge medications.  Relevant Imaging Results:  Relevant Lab Results:   Additional Information SSN: [id number]  Lorri Frederick, LCSW

## 2024-02-09 NOTE — Anesthesia Postprocedure Evaluation (Signed)
 Anesthesia Post Note  Patient: Bridget [name]  Procedure(s) Performed: LEFT TOTAL KNEE REPLACEMENT (Left: Knee)     Patient location during evaluation: PACU Anesthesia Type: General and Regional Level of consciousness: awake and alert Pain management: pain level controlled Vital Signs Assessment: post-procedure vital signs reviewed and stable Respiratory status: spontaneous breathing, nonlabored ventilation, respiratory function stable and patient connected to nasal cannula oxygen Cardiovascular status: blood pressure returned to baseline and stable Postop Assessment: no apparent nausea or vomiting Anesthetic complications: no   No notable events documented.  Last Vitals:  Vitals:   02/09/24 1127 02/09/24 1958  BP: 135/67 (!) 129/59  Pulse: 66 (!) 104  Resp: 17 16  Temp: 36.9 C 37.2 C  SpO2: 100% 92%    Last Pain:  Vitals:   02/09/24 1958  TempSrc: Oral  PainSc:                  Maryon Kemnitz S

## 2024-02-09 NOTE — Progress Notes (Signed)
 RE:      Bridget [name]                                                          Date of Birth:             June 04, 2056                                                 Date:               02/09/24                                                     To Whom It May Concern:   Please be advised that the above-named patient will require a short-term nursing home stay - anticipated 30 days or less for rehabilitation and strengthening.  The plan is for return home.                 Dr Jacklynn Barnacle, MD                                                                                                                                         MD signature   02/09/24                                                                                                                                                             Date

## 2024-02-09 NOTE — Progress Notes (Addendum)
 Physical Therapy Treatment Patient Details Name: Bridget [name] MRN: [id number] DOB: 1956/05/29 Today's Date: 02/09/2024   History of Present Illness 68 y.o. female presents to [facility] hospital on 02/08/2024 for elective L TKA. PMH includes anxiety, schizoaffective disorder, GERD, fibromyalgia, HLD, OA, insomnia, DMII.    PT Comments  Patient agreeable to participate with therapy.  Session began with a review of patient's HEP. Patient able to transition from a sit > stand using a RW, and ambulated 49ft with a RW within the unit. All transfers and gait training required CGA to ensure safety. Minimal cues provided for hand placement when transitioning from a sit > stand, additionally, the patient demonstrates safe use of RW when ambulating within the unit. Goniometer measurements: L Knee Flexion 71-degrees; L Knee Extension -10 degrees. Following functional activities, ended session with cryotherapy to modulate pain. Acute PT will continue to follow up.    If plan is discharge home, recommend the following: A little help with walking and/or transfers;A lot of help with bathing/dressing/bathroom;Assistance with cooking/housework;Assist for transportation;Help with stairs or ramp for entrance   Can travel by private vehicle        Equipment Recommendations  Rolling walker (2 wheels);BSC/3in1    Recommendations for Other Services       Precautions / Restrictions Precautions Precautions: Fall;Knee Restrictions Weight Bearing Restrictions Per Provider Order: Yes LLE Weight Bearing Per Provider Order: Weight bearing as tolerated     Mobility  Bed Mobility                    Transfers Overall transfer level: Needs assistance Equipment used: Rolling walker (2 wheels) Transfers: Sit to/from Stand Sit to Stand: Contact guard assist           General transfer comment: CGA, increased time and effort for STS. Cues provided for hand placement    Ambulation/Gait Ambulation/Gait  assistance: Contact guard assist Gait Distance (Feet): 94 Feet Assistive device: Rolling walker (2 wheels) Gait Pattern/deviations: Step-to pattern, Decreased step length - left, Decreased dorsiflexion - left, Decreased stride length Gait velocity: reduced     General Gait Details: slowed step-to gait   Stairs             Wheelchair Mobility     Tilt Bed    Modified Rankin (Stroke Patients Only)       Balance Overall balance assessment: Needs assistance Sitting-balance support: No upper extremity supported, Feet supported Sitting balance-Leahy Scale: Fair     Standing balance support: Bilateral upper extremity supported, Reliant on assistive device for balance Standing balance-Leahy Scale: Fair Standing balance comment: Reliant on AD to maintain balance                            Communication Communication Communication: No apparent difficulties  Cognition Arousal: Alert Behavior During Therapy: WFL for tasks assessed/performed                             Following commands: Intact      Cueing Cueing Techniques: Verbal cues, Tactile cues  Exercises General Exercises - Lower Extremity Ankle Circles/Pumps: AROM, Both, 10 reps, Seated Quad Sets: AROM, Left, 10 reps, Seated Long Arc Quad: AROM, Left, 10 reps, Seated Heel Slides: AROM, Left, 10 reps, Seated Hip ABduction/ADduction: AROM, Left, 10 reps, Seated    General Comments        Pertinent Vitals/Pain Pain Assessment Pain Assessment:  0-10 Pain Score: 8  Pain Location: L knee, L lower abdomen Pain Descriptors / Indicators: Aching Pain Intervention(s): Monitored during session, Ice applied    Home Living Family/patient expects to be discharged to:: Private residence Living Arrangements: Alone Available Help at Discharge: Family;Available PRN/intermittently Type of Home: House Home Access: Level entry       Home Layout: One level Home Equipment: Cane - single  point;Shower seat Additional Comments: Pt lives alone, plans to go to rehab after DC    Prior Function            PT Goals (current goals can now be found in the care plan section) Acute Rehab PT Goals Patient Stated Goal: to return to independence PT Goal Formulation: With patient Time For Goal Achievement: 02/12/24 Potential to Achieve Goals: Good Progress towards PT goals: Progressing toward goals    Frequency    7X/week      PT Plan      Co-evaluation              AM-PAC PT "6 Clicks" Mobility   Outcome Measure  Help needed turning from your back to your side while in a flat bed without using bedrails?: A Little Help needed moving from lying on your back to sitting on the side of a flat bed without using bedrails?: A Little Help needed moving to and from a bed to a chair (including a wheelchair)?: A Little Help needed standing up from a chair using your arms (e.g., wheelchair or bedside chair)?: A Little Help needed to walk in hospital room?: A Little Help needed climbing 3-5 steps with a railing? : Total 6 Click Score: 16    End of Session Equipment Utilized During Treatment: Gait belt Activity Tolerance: Patient tolerated treatment well Patient left: in chair;with call bell/phone within reach;with nursing/sitter in room   PT Visit Diagnosis: Other abnormalities of gait and mobility (R26.89);Muscle weakness (generalized) (M62.81)     Time: 1120-1201 PT Time Calculation (min) (ACUTE ONLY): 41 min  Charges:    $Gait Training: 23-37 mins $Therapeutic Exercise: 8-22 mins PT General Charges $$ ACUTE PT VISIT: 1 Visit                    Doreen Beam, SPT   Bridget [name] 02/09/2024, 1:08 PM

## 2024-02-10 ENCOUNTER — Other Ambulatory Visit (HOSPITAL_COMMUNITY): Payer: Self-pay

## 2024-02-10 DIAGNOSIS — M1712 Unilateral primary osteoarthritis, left knee: Secondary | ICD-10-CM | POA: Diagnosis not present

## 2024-02-10 NOTE — TOC Progression Note (Addendum)
 Transition of Care [facility]) - Progression Note    Patient Details  Name: Bridget [name] MRN: [id number] Date of Birth: [birth year]  [facility]) CM/SW Contact  Lorri Frederick, LCSW Phone Number: 02/10/2024, 9:46 AM  Clinical Narrative:  PASSR received: [id number] [facility] notified.   CSW LM with [facility] CSW to arrange transport to [place].    1115:CSW spoke wit Gia/[facility], she will send transport in 20 minutes.  [employer].   Expected Discharge Plan: Skilled Nursing Facility Barriers to Discharge: Other (must enter comment) (PASSR still pending)  Expected Discharge Plan and Services In-house Referral: Clinical Social Work   Post Acute Care Choice: Skilled Nursing Facility Living arrangements for the past 2 months: Single Family Home Expected Discharge Date: 02/10/24                                     Social Determinants of Health (SDOH) Interventions SDOH Screenings   Food Insecurity: No Food Insecurity (02/08/2024)  Housing: Low Risk  (02/08/2024)  Transportation Needs: No Transportation Needs (02/08/2024)  Utilities: Not At Risk (02/08/2024)  Depression (PHQ2-9): Low Risk  (06/25/2023)  Social Connections: Moderately Integrated (02/08/2024)  Tobacco Use: Low Risk  (02/08/2024)    Readmission Risk Interventions     No data to display

## 2024-02-10 NOTE — Progress Notes (Signed)
 Subjective: 2 Days Post-Op Procedure(s) (LRB): LEFT TOTAL KNEE REPLACEMENT (Left) Patient reports pain as mild.    Objective: Vital signs in last 24 hours: Temp:  [98.5 F (36.9 C)-99.2 F (37.3 C)] 99.2 F (37.3 C) (02/26 0728) Pulse Rate:  [66-104] 95 (02/26 0728) Resp:  [16-18] 18 (02/26 0728) BP: (100-140)/(49-67) 100/49 (02/26 0728) SpO2:  [92 %-100 %] 98 % (02/26 0728)  Intake/Output from previous day: 02/25 0701 - 02/26 0700 In: 120 [P.O.:120] Out: 0  Intake/Output this shift: No intake/output data recorded.  No results for input(s): "HGB" in the last 72 hours. No results for input(s): "WBC", "RBC", "HCT", "PLT" in the last 72 hours. No results for input(s): "NA", "K", "CL", "CO2", "BUN", "CREATININE", "GLUCOSE", "CALCIUM" in the last 72 hours. No results for input(s): "LABPT", "INR" in the last 72 hours.  Neurologically intact Neurovascular intact Sensation intact distally Intact pulses distally Dorsiflexion/Plantar flexion intact Incision: dressing C/D/I No cellulitis present Ivac in place with good seal.  No fluid in canister   Assessment/Plan: 2 Days Post-Op Procedure(s) (LRB): LEFT TOTAL KNEE REPLACEMENT (Left) Advance diet Up with therapy D/C IV fluids Discharge to SNF WBAT LLE Please swap out hospital vac unit with portable prevena day of discharge F/u in one week     Cristie Hem 02/10/2024, 8:06 AM

## 2024-02-10 NOTE — Progress Notes (Signed)
 Physical Therapy Treatment Patient Details Name: Bridget [name] MRN: [id number] DOB: Dec 05, 1956 Today's Date: 02/10/2024   History of Present Illness 68 y.o. female presents to [facility] hospital on 02/08/2024 for elective L TKA. PMH includes anxiety, schizoaffective disorder, GERD, fibromyalgia, HLD, OA, insomnia, DMII.    PT Comments  Patient agreeable to participate with therapy.  Session began with a brief review of patient's HEP, patient states that she will remain compliant. Patient able to transition from a sit > stand using a RW from an elevated surface, and ambulated 158ft with a RW within the unit. All transfers and gait training required CGA to ensure safety. Patient practiced toileting prior to ambulating in the unit. Goniometer measurements: L Knee Flexion 71-degrees; L Knee Extension -12 degrees. Cues to maintain upright posture when ambulating and increase gait speed with RW. Following functional activities, ended session with cryotherapy to modulate pain. Patient will benefit from continued inpatient follow up therapy, <3 hours/day to maximize functional level of independence.    If plan is discharge home, recommend the following: A little help with walking and/or transfers;A lot of help with bathing/dressing/bathroom;Assistance with cooking/housework;Assist for transportation;Help with stairs or ramp for entrance   Can travel by private vehicle        Equipment Recommendations  Rolling walker (2 wheels);BSC/3in1    Recommendations for Other Services       Precautions / Restrictions Precautions Precautions: Fall;Knee;Other (comment) (Portable Wound Vac) Precaution Booklet Issued: Yes (comment) Recall of Precautions/Restrictions: Intact Restrictions Weight Bearing Restrictions Per Provider Order: Yes LLE Weight Bearing Per Provider Order: Weight bearing as tolerated     Mobility  Bed Mobility Overal bed mobility: Needs Assistance Bed Mobility: Supine to Sit     Supine  to sit: Contact guard     General bed mobility comments: CGA for safety, increased time    Transfers Overall transfer level: Needs assistance Equipment used: Rolling walker (2 wheels) Transfers: Sit to/from Stand Sit to Stand: Contact guard assist, From elevated surface           General transfer comment: CGA, increased time and effort for STS from low surface. Adjusted bed height. Cues provided for hand placement    Ambulation/Gait Ambulation/Gait assistance: Contact guard assist Gait Distance (Feet): 115 Feet Assistive device: Rolling walker (2 wheels) Gait Pattern/deviations: Step-to pattern, Step-through pattern, Decreased step length - left, Decreased stance time - left, Decreased stride length Gait velocity: reduced     General Gait Details: slowed step-to gait, cues to maintain upright posture and increase velocity when using a RW. Progressed to a step-through pattern at a reduced velocity   Stairs             Wheelchair Mobility     Tilt Bed    Modified Rankin (Stroke Patients Only)       Balance Overall balance assessment: Needs assistance Sitting-balance support: Bilateral upper extremity supported, Feet supported Sitting balance-Leahy Scale: Fair     Standing balance support: Bilateral upper extremity supported, During functional activity, Reliant on assistive device for balance Standing balance-Leahy Scale: Fair Standing balance comment: Reliant on AD to maintain balance                            Communication Communication Communication: No apparent difficulties  Cognition Arousal: Alert Behavior During Therapy: [facility] for tasks assessed/performed  Following commands: Intact      Cueing Cueing Techniques: Verbal cues, Tactile cues  Exercises General Exercises - Lower Extremity Ankle Circles/Pumps: AROM, Both, 10 reps, Seated Quad Sets: AROM, Left, 10 reps, Seated Short Arc Quad:  AROM, Left, 10 reps, Supine Heel Slides: AROM, Left, 10 reps, Seated    General Comments General comments (skin integrity, edema, etc.): Pre-Mobility: 116/72 (89); 100 HR; 98% SpO2 on RA. Goniometer measurements: L Knee Flexion 71-degrees; L Knee Extension -12 degrees.      Pertinent Vitals/Pain Pain Assessment Pain Assessment: 0-10 Pain Score: 9  Pain Location: L knee, L lower abdomen Pain Descriptors / Indicators: Aching Pain Intervention(s): Limited activity within patient's tolerance, Monitored during session, RN gave pain meds during session, Ice applied    Home Living                          Prior Function            PT Goals (current goals can now be found in the care plan section) Acute Rehab PT Goals Patient Stated Goal: to return to independence PT Goal Formulation: With patient Time For Goal Achievement: 02/12/24 Potential to Achieve Goals: Good Progress towards PT goals: Progressing toward goals    Frequency    7X/week      PT Plan      Co-evaluation              AM-PAC PT "6 Clicks" Mobility   Outcome Measure  Help needed turning from your back to your side while in a flat bed without using bedrails?: A Little Help needed moving from lying on your back to sitting on the side of a flat bed without using bedrails?: A Little Help needed moving to and from a bed to a chair (including a wheelchair)?: A Little Help needed standing up from a chair using your arms (e.g., wheelchair or bedside chair)?: A Little Help needed to walk in hospital room?: A Little Help needed climbing 3-5 steps with a railing? : Total 6 Click Score: 16    End of Session Equipment Utilized During Treatment: Gait belt Activity Tolerance: Patient tolerated treatment well Patient left: in chair;with call bell/phone within reach Nurse Communication: Mobility status;Other (comment) (Chair alarm not set or placed) PT Visit Diagnosis: Other abnormalities of gait and  mobility (R26.89);Muscle weakness (generalized) (M62.81)     Time: 0820-0906 PT Time Calculation (min) (ACUTE ONLY): 46 min  Charges:    $Gait Training: 8-22 mins $Therapeutic Exercise: 8-22 mins $Therapeutic Activity: 8-22 mins PT General Charges $$ ACUTE PT VISIT: 1 Visit                     Doreen Beam, SPT   Jesusmanuel Erbes 02/10/2024, 9:41 AM

## 2024-02-10 NOTE — TOC Transition Note (Signed)
 Transition of Care [facility]) - Discharge Note   Patient Details  Name: Bridget [name] MRN: [id number] Date of Birth: Apr 01, 1956  Transition of Care [facility]) CM/SW Contact:  Lorri Frederick, LCSW Phone Number: 02/10/2024, 11:23 AM   Clinical Narrative:   Pt discharging to [facility].  RN call report to [phone].      Final next level of care: Skilled Nursing Facility Barriers to Discharge: Barriers Resolved   Patient Goals and CMS Choice Patient states their goals for this hospitalization and ongoing recovery are:: get rid of this pain   Choice offered to / list presented to : Patient, Adult Children (daughter [name])      Discharge Placement              Patient chooses bed at: [facility] Patient to be transferred to facility by: [facility] Name of family member notified: unable to reach either daughter or to leave message Patient and family notified of of transfer: 02/10/24  Discharge Plan and Services Additional resources added to the After Visit Summary for   In-house Referral: Clinical Social Work   Post Acute Care Choice: Skilled Nursing Facility                               Social Drivers of Health (SDOH) Interventions SDOH Screenings   Food Insecurity: No Food Insecurity (02/08/2024)  Housing: Low Risk  (02/08/2024)  Transportation Needs: No Transportation Needs (02/08/2024)  Utilities: Not At Risk (02/08/2024)  Depression (PHQ2-9): Low Risk  (06/25/2023)  Social Connections: Moderately Integrated (02/08/2024)  Tobacco Use: Low Risk  (02/08/2024)     Readmission Risk Interventions     No data to display

## 2024-02-10 NOTE — Progress Notes (Signed)
 Transport arrived, belongings gathered for patient. Patient washed up by tech, PIV removed, report given to transport along with rx, and AVS in packet. Attempted to call [facility] 2x to give report to receiving nurse. Patient aware of transfer, no further questions at this time. Pt Ice machine given to transport

## 2024-02-11 ENCOUNTER — Telehealth: Payer: Self-pay

## 2024-02-11 NOTE — Telephone Encounter (Signed)
 Enrique Sack with [facility] would like clarification on Doxycycline Rx.  CB# [phone].  Please advise.  Thank you.

## 2024-02-11 NOTE — Telephone Encounter (Signed)
 Called. Gave directions to be taken for 10days, 1 BID.

## 2024-02-23 ENCOUNTER — Encounter: Payer: Self-pay | Admitting: Physician Assistant

## 2024-02-23 ENCOUNTER — Ambulatory Visit (INDEPENDENT_AMBULATORY_CARE_PROVIDER_SITE_OTHER): Payer: Medicare (Managed Care) | Admitting: Physician Assistant

## 2024-02-23 DIAGNOSIS — Z96652 Presence of left artificial knee joint: Secondary | ICD-10-CM

## 2024-02-23 NOTE — Progress Notes (Signed)
 Post-Op Visit Note   Patient: Bridget [name]           Date of Birth: 1956/02/21           MRN: [id number] Visit Date: 02/23/2024 PCP: Kermit Balo, DO   Assessment & Plan:  Chief Complaint:  Chief Complaint  Patient presents with   Left Knee - Follow-up    Left total knee arthroplasty 02/08/2024   Visit Diagnoses:  1. Status post total left knee replacement     Plan: Patient is a pleasant 68 year old female who comes in today 2 weeks status post left total knee replacement 02/08/2024.  She has been doing okay.  She is currently living at [facility]  She tells me she is getting physical therapy.  She has been taking Eliquis twice daily for DVT prophylaxis.  She is taking oxycodone for pain.  She has been wearing her IVAC for the past 2 weeks.  Examination of left knee reveals a well-healed surgical incision with nylon sutures in place.  No evidence of infection or cellulitis.  Calves are soft nontender.  She is neurovascularly intact distally.  Today, sutures were removed and Steri-Strips applied.  She will continue with physical therapy.  Continue with the Eliquis until 4 weeks postop and then transition to a baby aspirin twice daily for the next 2 weeks.  She will continue with physical therapy.  She will follow-up with [place] in 4 weeks for repeat evaluation and 2 view x-rays of the left knee.  I have written all of this on a prescription pad for her to bring back to [place].  I have also included that they need to let us know when she is discharged so that we can send in a referral for outpatient physical therapy.  She understands and agrees.  Call with concerns or questions.  Follow-Up Instructions: Return in about 4 weeks (around 03/22/2024).   Orders:  No orders of the defined types were placed in this encounter.  No orders of the defined types were placed in this encounter.   Imaging: No new imaging  PMFS History: Patient Active Problem List   Diagnosis Date Noted    Status post total left knee replacement 02/08/2024   Angina pectoris (HCC) 09/03/2023   Cardiac murmur 09/03/2023   Anxiety    Back pain    Barrett's esophagus    Chronic headaches    Constipation    Depression    Dry mouth    Fibromyalgia    GERD (gastroesophageal reflux disease)    Heart murmur    Hyperlipemia    Insomnia    Myoclonus    Osteoarthritis    Schizoaffective disorder (HCC)    SVD (spontaneous vaginal delivery)    Vertigo    Wears dentures    Chronic bilateral low back pain without sciatica 06/25/2023   Dysphagia 11/02/2022   Primary osteoarthritis of right knee 01/28/2022   Primary osteoarthritis of left knee 01/28/2022   Complete tear of right rotator cuff 10/30/2021   GAD (generalized anxiety disorder) 01/24/2020   Schizoaffective disorder, bipolar type (HCC) 02/04/2018   Age-related nuclear cataract of both eyes 01/26/2017   Cortical senile cataract of both eyes 01/26/2017   Bilateral presbyopia 01/26/2017   Corneal dystrophy, anterior 01/26/2017   Auditory hallucinations 07/08/2016   Visual hallucination 07/08/2016   Allergic rhinitis due to pollen 02/07/2016   Glossitis 02/07/2016   Myositis 02/07/2016   Plantar fasciitis 02/07/2016   DDD (degenerative disc disease), cervical  02/07/2016   Joint pain, knee 02/07/2016   Menopausal disorder 02/07/2016   Morbid obesity (HCC) 02/07/2016   Obesity 02/07/2016   Migraine headache 02/07/2016   Myalgia 02/07/2016   Periodic limb movement disorder 02/07/2016   Restless leg syndrome 02/07/2016   Referred otalgia 02/07/2016   Shoulder pain 02/07/2016   Toxic effect of secondhand tobacco smoke 02/07/2016   Vaginal atrophy 02/07/2016   Vulvar atrophy 02/07/2016   Acute sinusitis 02/06/2016   Brachial neuritis or radiculitis 02/06/2016   Cervical neuritis 02/06/2016   Bursitis of left hip 02/06/2016   Dermatitis, eczematoid 02/06/2016   Disorder of bursae and tendons in shoulder region 02/06/2016    Enthesopathy of ankle and tarsus 02/06/2016   Fainting 02/06/2016   Type II diabetes mellitus (HCC) 04/07/2014   DDD (degenerative disc disease), lumbosacral 01/03/2014   Generalized osteoarthrosis 09/15/2013   Past Medical History:  Diagnosis Date   Acute sinusitis 02/06/2016   Age-related nuclear cataract of both eyes 01/26/2017   Allergic rhinitis due to pollen 02/07/2016   Anxiety    Auditory hallucinations 07/08/2016   Back pain    low back - arthritis   Barrett's esophagus    Bilateral presbyopia 01/26/2017   Brachial neuritis or radiculitis 02/06/2016   Bursitis of left hip 02/06/2016   Cervical neuritis 02/06/2016   Chronic bilateral low back pain without sciatica 06/25/2023   Chronic headaches    Complete tear of right rotator cuff 10/30/2021   Constipation    miralax daily   Corneal dystrophy, anterior 01/26/2017   Cortical senile cataract of both eyes 01/26/2017   DDD (degenerative disc disease), cervical 02/07/2016   DDD (degenerative disc disease), lumbosacral 01/03/2014   Depression    Dermatitis, eczematoid 02/06/2016   Disorder of bursae and tendons in shoulder region 02/06/2016   Dry mouth    biotene   Dysphagia 11/02/2022   Enthesopathy of ankle and tarsus 02/06/2016   Fainting 02/06/2016   Fibromyalgia    GAD (generalized anxiety disorder) 01/24/2020   Generalized osteoarthrosis 09/15/2013   GERD (gastroesophageal reflux disease)    Glossitis 02/07/2016   Heart murmur    Hyperlipemia    Hypertension    Insomnia    Joint pain, knee 02/07/2016   Menopausal disorder 02/07/2016   Migraine headache 02/07/2016   Morbid obesity (HCC) 02/07/2016   Myalgia 02/07/2016   Myoclonus    patient states "no longer an issue"   Myositis 02/07/2016   Obesity 02/07/2016   Osteoarthritis    lower back, shoulders, hands   Periodic limb movement disorder 02/07/2016   Plantar fasciitis 02/07/2016   Primary osteoarthritis of left knee 01/28/2022   Primary  osteoarthritis of right knee 01/28/2022   Referred otalgia 02/07/2016   Restless leg syndrome 02/07/2016   Schizoaffective disorder (HCC)    Schizoaffective disorder, bipolar type (HCC) 02/04/2018   Shoulder pain 02/07/2016   SVD (spontaneous vaginal delivery)    x 3   Toxic effect of secondhand tobacco smoke 02/07/2016   Vaginal atrophy 02/07/2016   Vertigo    Visual hallucination 07/08/2016   Vulvar atrophy 02/07/2016   Wears dentures    full    Family History  Problem Relation Age of Onset   Diabetes Mother    Prostate cancer Father    High blood pressure Sister    Colon cancer Neg Hx    Colon polyps Neg Hx    Rectal cancer Neg Hx    Stomach cancer Neg Hx     Past  Surgical History:  Procedure Laterality Date   ABDOMINAL HYSTERECTOMY     BREAST EXCISIONAL BIOPSY Right    BREAST EXCISIONAL BIOPSY Right    BREAST EXCISIONAL BIOPSY Right    BREAST EXCISIONAL BIOPSY Right    BUNIONECTOMY Bilateral    COLONOSCOPY  08/2013   HAND SURGERY Right    carpel tunnel surgery   INJECTION KNEE Bilateral 10/30/2021   Procedure: BILATERAL KNEE INJECTIONS;  Surgeon: Tarry Kos, MD;  Location: [facility];  Service: Orthopedics;  Laterality: Bilateral;   MULTIPLE TOOTH EXTRACTIONS     full dentures   SHOULDER ARTHROSCOPY WITH ROTATOR CUFF REPAIR AND SUBACROMIAL DECOMPRESSION Right 10/30/2021   Procedure: RIGHT SHOULDER ARTHROSCOPY WITH ROTATOR CUFF REPAIR AND SUBACROMIAL DECOMPRESSION, DEBRIDEMENT;  Surgeon: Tarry Kos, MD;  Location: [facility];  Service: Orthopedics;  Laterality: Right;   TOTAL KNEE ARTHROPLASTY Left 02/08/2024   Procedure: LEFT TOTAL KNEE REPLACEMENT;  Surgeon: Tarry Kos, MD;  Location: [facility];  Service: Orthopedics;  Laterality: Left;   TRIGGER FINGER RELEASE Bilateral    thumbs   Social History   Occupational History   Not on file  Tobacco Use   Smoking status: Never   Smokeless tobacco: Never  Vaping Use   Vaping  status: Never Used  Substance and Sexual Activity   Alcohol use: No   Drug use: No   Sexual activity: Not Currently    Birth control/protection: Post-menopausal    Comment: Hysterectomy

## 2024-03-17 ENCOUNTER — Encounter: Payer: Self-pay | Admitting: Student in an Organized Health Care Education/Training Program

## 2024-03-17 ENCOUNTER — Ambulatory Visit
Payer: Medicare (Managed Care) | Attending: Student in an Organized Health Care Education/Training Program | Admitting: Student in an Organized Health Care Education/Training Program

## 2024-03-17 VITALS — BP 116/82 | HR 70 | Temp 97.7°F | Ht 64.0 in | Wt 230.0 lb

## 2024-03-17 DIAGNOSIS — G8929 Other chronic pain: Secondary | ICD-10-CM | POA: Insufficient documentation

## 2024-03-17 DIAGNOSIS — M4726 Other spondylosis with radiculopathy, lumbar region: Secondary | ICD-10-CM | POA: Diagnosis not present

## 2024-03-17 DIAGNOSIS — G894 Chronic pain syndrome: Secondary | ICD-10-CM | POA: Insufficient documentation

## 2024-03-17 DIAGNOSIS — M5416 Radiculopathy, lumbar region: Secondary | ICD-10-CM | POA: Diagnosis present

## 2024-03-17 DIAGNOSIS — M48062 Spinal stenosis, lumbar region with neurogenic claudication: Secondary | ICD-10-CM | POA: Insufficient documentation

## 2024-03-17 DIAGNOSIS — M47816 Spondylosis without myelopathy or radiculopathy, lumbar region: Secondary | ICD-10-CM | POA: Insufficient documentation

## 2024-03-17 NOTE — Progress Notes (Signed)
 Safety precautions to be maintained throughout the outpatient stay will include: orient to surroundings, keep bed in low position, maintain call bell within reach at all times, provide assistance with transfer out of bed and ambulation.

## 2024-03-17 NOTE — Progress Notes (Signed)
 PROVIDER NOTE: Interpretation of information contained herein should be left to medically-trained personnel. Specific patient instructions are provided elsewhere under "Patient Instructions" section of medical record. This document was created in part using AI and STT-dictation technology, any transcriptional errors that may result from this process are unintentional.  Patient: Bridget [name]  Service: E/M Encounter  PCP: Bridget Balo, DO  DOB: [birth year]  DOS: 03/17/2024  Provider: Edward Jolly, MD  MRN: [id number]  Delivery: Face-to-face  Specialty: Interventional Pain Management  Type: New Patient  Setting: Ambulatory outpatient facility  Specialty designation: 09  Referring Prov.: Bridget Rima, FNP  Location: Outpatient office facility     Primary Reason(s) for Visit: Encounter for initial evaluation of one or more chronic problems (new to examiner) potentially causing chronic pain, and posing a threat to normal musculoskeletal function. (Level of risk: High) CC: Back Pain (lower)  HPI  [name] is a 68 y.o. year old, female patient, who comes for the first time to our practice referred by Bridget Pepper D, FNP for our initial evaluation of her chronic pain. She has Schizoaffective disorder, bipolar type (HCC); GAD (generalized anxiety disorder); Complete tear of right rotator cuff; Primary osteoarthritis of right knee; Primary osteoarthritis of left knee; Dysphagia; Chronic bilateral low back pain without sciatica; Anxiety; Back pain; Barrett's esophagus; Chronic headaches; Constipation; Depression; Dry mouth; Fibromyalgia; GERD (gastroesophageal reflux disease); Heart murmur; Hyperlipemia; Insomnia; Myoclonus; Osteoarthritis; Schizoaffective disorder (HCC); SVD (spontaneous vaginal delivery); Vertigo; Wears dentures; Acute sinusitis; Age-related nuclear cataract of both eyes; Cortical senile cataract of both eyes; Allergic rhinitis due to pollen; Bilateral presbyopia; Brachial neuritis or  radiculitis; Cervical neuritis; Glossitis; Myositis; Plantar fasciitis; Bursitis of left hip; Corneal dystrophy, anterior; DDD (degenerative disc disease), cervical; DDD (degenerative disc disease), lumbosacral; Dermatitis, eczematoid; Disorder of bursae and tendons in shoulder region; Enthesopathy of ankle and tarsus; Fainting; Generalized osteoarthrosis; Joint pain, knee; Menopausal disorder; Morbid obesity (HCC); Obesity; Migraine headache; Myalgia; Periodic limb movement disorder; Restless leg syndrome; Referred otalgia; Shoulder pain; Toxic effect of secondhand tobacco smoke; Type II diabetes mellitus (HCC); Vaginal atrophy; Vulvar atrophy; Auditory hallucinations; Visual hallucination; Angina pectoris (HCC); Cardiac murmur; and Status post total left knee replacement on their problem list. Today she comes in for evaluation of her Back Pain (lower)  Pain Assessment: Location: Left, Right, Lower Back Radiating: pain radiaties down both leg Onset: More than a month ago Duration: Chronic pain Quality: Aching, Burning, Constant, Cramping, Discomfort, Sharp, Throbbing, Stabbing, Shooting, Nagging Severity: 10-Worst pain ever/10 (subjective, self-reported pain score)  Effect on ADL: limits my daily activities Timing: Constant Modifying factors: Meds BP: 116/82  HR: 70  Onset and Duration: Gradual and Present longer than 3 months Cause of pain: Unknown Severity: No change since onset, NAS-11 at its worse: 10/10, NAS-11 at its best: 5/10, NAS-11 now: .7/10, and NAS-11 on the average: 7/10 Timing: Not influenced by the time of the day Aggravating Factors: Lifiting, Prolonged sitting, Prolonged standing, Walking, Walking uphill, and Walking downhill Alleviating Factors: Medications Associated Problems: Depression, Fatigue, Pain that wakes patient up, and Pain that does not allow patient to sleep Quality of Pain: Aching, Annoying, Burning, Constant, Distressing, Dull, Nagging, Sharp, Shooting,  Stabbing, Throbbing, and Uncomfortable Previous Examinations or Tests: MRI scan and X-rays Previous Treatments: Epidural steroid injections, Physical Therapy, and Steroid treatments by mouth  [name] is being evaluated for possible interventional pain management therapies for the treatment of her chronic pain.   Discussed the use of AI scribe software for clinical note  transcription with the patient, who gave verbal consent to proceed.  History of Present Illness   Bridget [name] is a 68 year old female with severe arthritis and disc herniation who presents with low back pain and right leg pain from sciatica.  She has been experiencing low back pain radiating down her right leg for the past nine to ten months. The pain originates in her back, extends to her bottom, and travels down her leg. No specific incident, such as a fall, triggered the onset of her symptoms. She has been taking Tylenol for pain management but has not engaged in physical therapy. The pain predominantly affects her right side, although she has experienced pain in her left leg as well.  An MRI conducted in December revealed severe arthritis in her back and a disc herniation at L5-S1, compressing the nerve and causing sciatica. She denies any imaging being shown to her previously but acknowledges the presence of disc bulges at multiple levels and a shift in alignment, referred to as spondylosis.  In terms of her current functional status, she is able to walk without a walker and reports feeling stable in her legs. No issues with using the bathroom. She  resides in [place].       Meds   Current Outpatient Medications:    acetaminophen (TYLENOL) 500 MG tablet, Take 500 mg by mouth every 6 (six) hours as needed., Disp: , Rfl:    ARIPiprazole (ABILIFY) 20 MG tablet, Take 20 mg by mouth at bedtime., Disp: , Rfl:    Calcium Carb-Cholecalciferol (CALCIUM 600+D3) 600-10 MG-MCG TABS, Take 1 tablet by mouth in the morning  and at bedtime., Disp: , Rfl:    CEQUA 0.09 % SOLN, Place 1 drop into both eyes every 12 (twelve) hours., Disp: , Rfl:    DULoxetine (CYMBALTA) 60 MG capsule, Take 120 mg by mouth in the morning., Disp: , Rfl:    latanoprost (XALATAN) 0.005 % ophthalmic solution, Place 1 drop into both eyes at bedtime., Disp: , Rfl:    lidocaine (LIDODERM) 5 %, Place 1 patch onto the skin daily as needed (for lower back pain- Remove & Discard patch within 12 hours or as directed by MD)., Disp: , Rfl:    metoprolol tartrate (LOPRESSOR) 25 MG tablet, Take 12.5 mg by mouth 2 (two) times daily., Disp: , Rfl:    pantoprazole (PROTONIX) 40 MG tablet, Take 40 mg by mouth daily before breakfast., Disp: , Rfl:    polyethylene glycol (MIRALAX / GLYCOLAX) packet, Take 17 g by mouth daily., Disp: , Rfl:    QUEtiapine (SEROQUEL) 100 MG tablet, Take 100 mg by mouth at bedtime., Disp: , Rfl:    rosuvastatin (CRESTOR) 20 MG tablet, Take 20 mg by mouth daily., Disp: , Rfl:    senna (SENOKOT) 8.6 MG TABS tablet, Take 8.6 mg by mouth at bedtime., Disp: , Rfl:    Suvorexant (BELSOMRA) 20 MG TABS, Take 20 mg by mouth at bedtime., Disp: , Rfl:    temazepam (RESTORIL) 15 MG capsule, Take 15 mg by mouth at bedtime., Disp: , Rfl:    valbenazine (INGREZZA) 80 MG capsule, Take 80 mg by mouth daily. Movements in the face, Disp: , Rfl:    apixaban (ELIQUIS) 2.5 MG TABS tablet, Take one tablet by mouth twice daily.  To be taken after surgery to prevent blood clots (Patient not taking: Reported on 03/17/2024), Disp: 60 tablet, Rfl: 0   doxycycline (VIBRAMYCIN) 100 MG capsule, Take 1 capsule (100  mg total) by mouth 2 (two) times daily. To be taken after surgery (Patient not taking: Reported on 03/17/2024), Disp: 20 capsule, Rfl: 0   gabapentin (NEURONTIN) 100 MG capsule, Take 100-200 mg by mouth at bedtime. (Patient not taking: Reported on 02/08/2024), Disp: , Rfl:    methocarbamol (ROBAXIN-750) 750 MG tablet, Take 1 tablet (750 mg total) by mouth 2  (two) times daily as needed for muscle spasms. (Patient not taking: Reported on 09/03/2023), Disp: 20 tablet, Rfl: 2   oxyCODONE (OXY IR/ROXICODONE) 5 MG immediate release tablet, Take 1-2 tablets (5-10 mg total) by mouth every 6 (six) hours as needed for moderate pain (pain score 4-6) (pain score 4-6). (Patient not taking: Reported on 03/17/2024), Disp: 40 tablet, Rfl: 0  Imaging Review   MR SHOULDER RIGHT WO CONTRAST  Narrative CLINICAL DATA:  Chronic bilateral shoulder pain and popping for 3 years  EXAM: MRI OF THE RIGHT SHOULDER WITHOUT CONTRAST  TECHNIQUE: Multiplanar, multisequence MR imaging of the shoulder was performed. No intravenous contrast was administered.  COMPARISON:  05/24/2021 and report from 03/30/2003  FINDINGS: Despite efforts by the technologist and patient, motion artifact is present on today's exam and could not be eliminated. This reduces exam sensitivity and specificity.  Rotator cuff: Full-thickness partial width tear of the central portion of the distal supraspinatus tendon on images 11-12 of series 11, without a substantial degree of retraction. Moderate supraspinatus and infraspinatus tendinopathy distally.  Muscles: There is a band of edema along the posterior deltoid muscle on image 21 series 10 and image 14 series 6, deltoid strain versus recent injection.  Biceps long head:  Mild tendinopathy of the intra-articular segment.  Acromioclavicular Joint: Mild degenerative AC joint spurring. Type II acromion. Small amount of fluid in the subacromial subdeltoid bursa.  Glenohumeral Joint: Unremarkable  Labrum:  Unremarkable  Bones: No significant extra-articular osseous abnormalities identified.  Other: No supplemental non-categorized findings.  IMPRESSION: 1. Full-thickness partial width tear of the distal central supraspinatus tendon. Moderate supraspinatus and infraspinatus tendinopathy. 2. Band of edema in the posterior deltoid muscle,  strain versus recent injection. 3. Mild tendinopathy of the biceps. 4. Mild degenerative AC joint spurring.   Electronically Signed By: Gaylyn Rong M.[name]. On: 10/01/2021 10:48   MR Shoulder Left w/o contrast  Narrative CLINICAL DATA:  Chronic bilateral shoulder pain and popping over the last 3 years.  EXAM: MRI OF THE LEFT SHOULDER WITHOUT CONTRAST  TECHNIQUE: Multiplanar, multisequence MR imaging of the shoulder was performed. No intravenous contrast was administered.  COMPARISON:  Radiographs 08/27/2021 and prior MRI from 07/09/2012  FINDINGS: Despite efforts by the technologist and patient, motion artifact is present on today's exam and could not be eliminated. This reduces exam sensitivity and specificity.  Rotator cuff: Prominent supraspinatus tendinopathy with thinning, internal heterogeneity, and partial-thickness articular surface and bursal surface tearing but without a well-defined full-thickness tear identified. There is some mild fissuring in the distal infraspinatus tendon with moderate infraspinatus and mild subscapularis tendinopathy. Postoperative findings from prior rotator cuff repair  Muscles:  Unremarkable  Biceps long head:  Unremarkable  Acromioclavicular Joint: Mild degenerative spurring. Type II acromion. Trace subacromial subdeltoid bursitis.  Glenohumeral Joint: Moderate spurring of the humeral head with mild degenerative chondral thinning. Chondral fissuring centrally in the glenoid on image 10 series 7. Mild synovitis along the rotator interval.  Labrum:  Grossly unremarkable  Bones: No significant extra-articular osseous abnormalities identified.  Other: No supplemental non-categorized findings.  IMPRESSION: 1. Prominent supraspinatus tendinopathy with thinning and partial  tearing but no well-defined full-thickness tear. 2. Moderate infraspinatus and mild subscapularis tendinopathy, with mild fissuring in the distal  infraspinatus tendon. 3. Findings from prior rotator cuff repair. 4. Moderate degenerative glenohumeral arthropathy and mild degenerative AC joint arthropathy. Chondral fissuring centrally in the glenoid. 5. Mild synovitis in the rotator interval.   Electronically Signed By: Gaylyn Rong M.[name]. On: 10/01/2021 10:54   MR LUMBAR SPINE WO CONTRAST  Narrative CLINICAL DATA:  Chronic low back pain with new right buttock and thigh pain. No known injury or prior relevant surgery.  EXAM: MRI LUMBAR SPINE WITHOUT CONTRAST  TECHNIQUE: Multiplanar, multisequence MR imaging of the lumbar spine was performed. No intravenous contrast was administered.  COMPARISON:  Radiographs 01/23/2022.  MRI 03/08/2003.  FINDINGS: Segmentation: Conventional anatomy assumed, with the last open disc space designated L5-S1.Concordant with prior imaging.  Alignment: Mild convex right scoliosis with new degenerative grade 1 anterolisthesis at L5-S1.  Vertebrae: No worrisome osseous lesion, acute fracture or pars defect. Scattered endplate degenerative changes, greatest at L3-4 and L5-S1. The visualized sacroiliac joints appear unremarkable.  Conus medullaris: Extends to the L1-2 level and appears normal.  Paraspinal and other soft tissues: No significant paraspinal findings.  Disc levels:  Sagittal images demonstrate loss of disc height with asymmetric disc bulging and endplate osteophyte formation on the right at T11-12. Resulting mild right foraminal narrowing. No cord deformity.  T12-L1: Small to moderate central disc extrusion with caudal migration. There is mass effect on the thecal sac, but no cord deformity or foraminal narrowing.  L1-2: Disc bulging with a small central disc protrusion. No resulting spinal stenosis or nerve root encroachment.  L2-3: Loss of disc height with annular disc bulging and endplate osteophytes asymmetric to the left. Mild bilateral facet hypertrophy. No  significant spinal stenosis. There is mild narrowing of the left lateral recess and both foramina without definite nerve root encroachment.  L3-4: Significantly progressive spondylosis since 2004 with loss of disc height, annular disc bulging and endplate osteophytes asymmetric to the left. Mild facet and ligamentous hypertrophy. No significant central spinal stenosis. There is mild right and moderate left foraminal narrowing with probable left L3 nerve root encroachment.  L4-5: Loss of disc height with annular disc bulging eccentric to the left. Moderate facet and ligamentous hypertrophy, worse on the right. No significant central spinal stenosis. Mild left greater than right foraminal narrowing.  L5-S1: Loss of disc height with annular disc bulging and endplate osteophytes asymmetric to the right. Advanced facet and ligamentous hypertrophy accounting for the grade 1 anterolisthesis. Resulting mild spinal stenosis with asymmetric moderate narrowing of the left lateral recess and moderate right-greater-than-left foraminal narrowing. Right L5 nerve root encroachment likely.  IMPRESSION: 1. Progressive multilevel spondylosis from remote lumbar MRI of 2004. 2. Moderate left foraminal narrowing at L3-4 with probable left L3 nerve root encroachment. 3. Mild left greater than right foraminal narrowing at L4-5. 4. New degenerative grade 1 anterolisthesis at L5-S1 with resulting asymmetric moderate right foraminal narrowing and probable right L5 nerve root encroachment. There is also mild spinal stenosis and asymmetric moderate left foraminal narrowing at that level. 5. No other evidence of right-sided nerve root encroachment. 6. Small to moderate central disc extrusion with caudal migration at T12-L1 without resulting spinal stenosis or nerve root encroachment.   Electronically Signed By: Carey Bullocks M.[name]. On: 12/05/2023 12:20   DG Lumbar Spine 2-3 Views  Narrative CLINICAL  DATA:  Chronic mid back pain, worse over the past 8 months.  EXAM: LUMBAR SPINE -  2-3 VIEW  COMPARISON:  01/23/2022  FINDINGS: There are 5 non rib-bearing lumbar type vertebral bodies  Mild scoliotic curvature of the thoracolumbar spine with dominant caudal column X to the right measuring approximately 11 degrees (as measured from the superior endplate of L1 to the inferior endplate of L4). Grade 1 anterolisthesis of L4 upon L5 and L5 upon S1 measuring 5 mm and 7 mm in diameter respectively, similar to the 01/2022 examination.  Lumbar vertebral body heights appear preserved.  Mild-to-moderate multilevel lumbar spine DDD, worse at L3-L4 with disc space height loss, endplate irregularity and sclerosis.  Limited visualization of the bilateral SI joints is normal. Stigmata of dish within the thoracic spine.  Regional bowel gas pattern and soft tissues appear normal.  IMPRESSION: Mild-to-moderate multilevel lumbar spine DDD, worse at L3-L4, progressed compared to the 01/23/2022 examination.   Electronically Signed By: Simonne Come M.[name]. On: 11/29/2023 16:23  Complexity Note: Imaging results reviewed.                         ROS  Cardiovascular: Heart trouble Pulmonary or Respiratory: No reported pulmonary signs or symptoms such as wheezing and difficulty taking a deep full breath (Asthma), difficulty blowing air out (Emphysema), coughing up mucus (Bronchitis), persistent dry cough, or temporary stoppage of breathing during sleep Neurological: No reported neurological signs or symptoms such as seizures, abnormal skin sensations, urinary and/or fecal incontinence, being born with an abnormal open spine and/or a tethered spinal cord Psychological-Psychiatric: Depressed Gastrointestinal: Heartburn due to stomach pushing into lungs (Hiatal hernia) Genitourinary: No reported renal or genitourinary signs or symptoms such as difficulty voiding or producing urine, peeing blood,  non-functioning kidney, kidney stones, difficulty emptying the bladder, difficulty controlling the flow of urine, or chronic kidney disease Hematological: No reported hematological signs or symptoms such as prolonged bleeding, low or poor functioning platelets, bruising or bleeding easily, hereditary bleeding problems, low energy levels due to low hemoglobin or being anemic Endocrine: No reported endocrine signs or symptoms such as high or low blood sugar, rapid heart rate due to high thyroid levels, obesity or weight gain due to slow thyroid or thyroid disease Rheumatologic: Rheumatoid arthritis Musculoskeletal: Negative for myasthenia gravis, muscular dystrophy, multiple sclerosis or malignant hyperthermia Work History: Disabled  Allergies  Ms. Gully is allergic to flexeril [cyclobenzaprine], amitriptyline, and penicillins.  Laboratory Chemistry Profile   Renal Lab Results  Component Value Date   BUN 13 01/28/2024   CREATININE 0.96 01/28/2024   GFR 59.01 (L) 10/30/2022   GFRAA >60 01/23/2020   GFRNONAA >60 01/28/2024   PROTEINUR 30 (A) 04/04/2023     Electrolytes Lab Results  Component Value Date   NA 141 01/28/2024   K 4.3 01/28/2024   CL 103 01/28/2024   CALCIUM 9.4 01/28/2024     Hepatic Lab Results  Component Value Date   AST 17 11/06/2022   ALT 12 11/06/2022   ALBUMIN 3.8 11/06/2022   ALKPHOS 60 11/06/2022   LIPASE 26 11/06/2022     ID Lab Results  Component Value Date   SARSCOV2NAA NEGATIVE 04/03/2023   STAPHAUREUS NEGATIVE 01/28/2024   MRSAPCR NEGATIVE 01/28/2024     Bone No results found for: "VD25OH", "VD125OH2TOT", "ZO1096EA5", "WU9811BJ4", "25OHVITD1", "25OHVITD2", "25OHVITD3", "TESTOFREE", "TESTOSTERONE"   Endocrine Lab Results  Component Value Date   GLUCOSE 85 01/28/2024   GLUCOSEU NEGATIVE 04/04/2023     Neuropathy No results found for: "VITAMINB12", "FOLATE", "HGBA1C", "HIV"   CNS No results found for: "  COLORCSF", "APPEARCSF",  "RBCCOUNTCSF", "WBCCSF", "POLYSCSF", "LYMPHSCSF", "EOSCSF", "PROTEINCSF", "GLUCCSF", "JCVIRUS", "CSFOLI", "IGGCSF", "LABACHR", "ACETBL"   Inflammation (CRP: Acute  ESR: Chronic) No results found for: "CRP", "ESRSEDRATE", "LATICACIDVEN"   Rheumatology No results found for: "RF", "ANA", "LABURIC", "URICUR", "LYMEIGGIGMAB", "LYMEABIGMQN", "HLAB27"   Coagulation Lab Results  Component Value Date   PLT 270 01/28/2024     Cardiovascular Lab Results  Component Value Date   HGB 11.9 (L) 01/28/2024   HCT 37.5 01/28/2024     Screening Lab Results  Component Value Date   SARSCOV2NAA NEGATIVE 04/03/2023   STAPHAUREUS NEGATIVE 01/28/2024   MRSAPCR NEGATIVE 01/28/2024     Cancer No results found for: "CEA", "CA125", "LABCA2"   Allergens No results found for: "ALMOND", "APPLE", "ASPARAGUS", "AVOCADO", "BANANA", "BARLEY", "BASIL", "BAYLEAF", "GREENBEAN", "LIMABEAN", "WHITEBEAN", "BEEFIGE", "REDBEET", "BLUEBERRY", "BROCCOLI", "CABBAGE", "MELON", "CARROT", "CASEIN", "CASHEWNUT", "CAULIFLOWER", "CELERY"     Note: Lab results reviewed.  PFSH  Drug: Ms. Basinski  reports no history of drug use. Alcohol:  reports no history of alcohol use. Tobacco:  reports that she has never smoked. She has never used smokeless tobacco. Medical:  has a past medical history of Acute sinusitis (02/06/2016), Age-related nuclear cataract of both eyes (01/26/2017), Allergic rhinitis due to pollen (02/07/2016), Anxiety, Auditory hallucinations (07/08/2016), Back pain, Barrett's esophagus, Bilateral presbyopia (01/26/2017), Brachial neuritis or radiculitis (02/06/2016), Bursitis of left hip (02/06/2016), Cervical neuritis (02/06/2016), Chronic bilateral low back pain without sciatica (06/25/2023), Chronic headaches, Complete tear of right rotator cuff (10/30/2021), Constipation, Corneal dystrophy, 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), Cortical senile cataract of both eyes (01/26/2017), DDD (degenerative disc disease), cervical  (02/07/2016), DDD (degenerative disc disease), lumbosacral (01/03/2014), Depression, Dermatitis, eczematoid (02/06/2016), Disorder of bursae and tendons in shoulder region (02/06/2016), Dry mouth, Dysphagia (11/02/2022), Enthesopathy of ankle and tarsus (02/06/2016), Fainting (02/06/2016), Fibromyalgia, GAD (generalized anxiety disorder) (01/24/2020), 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/15/2013), GERD (gastroesophageal reflux disease), Glossitis (02/07/2016), Heart murmur, Hyperlipemia, Hypertension, Insomnia, Joint pain, knee (02/07/2016), Menopausal disorder (02/07/2016), Migraine headache (02/07/2016), Morbid obesity (HCC) (02/07/2016), Myalgia (02/07/2016), Myoclonus, Myositis (02/07/2016), Obesity (02/07/2016), Osteoarthritis, Periodic limb movement disorder (02/07/2016), Plantar fasciitis (02/07/2016), Primary osteoarthritis of left knee (01/28/2022), Primary osteoarthritis of right knee (01/28/2022), Referred otalgia (02/07/2016), Restless leg syndrome (02/07/2016), Schizoaffective disorder (HCC), Schizoaffective disorder, bipolar type (HCC) (02/04/2018), Shoulder pain (02/07/2016), SVD (spontaneous vaginal delivery), Toxic effect of secondhand tobacco smoke (02/07/2016), Vaginal atrophy (02/07/2016), Vertigo, Visual hallucination (07/08/2016), Vulvar atrophy (02/07/2016), and Wears dentures. Family: family history includes Diabetes in her mother; High blood pressure in her sister; Prostate cancer in her father.  Past Surgical History:  Procedure Laterality Date   ABDOMINAL HYSTERECTOMY     BREAST EXCISIONAL BIOPSY Right    BREAST EXCISIONAL BIOPSY Right    BREAST EXCISIONAL BIOPSY Right    BREAST EXCISIONAL BIOPSY Right    BUNIONECTOMY Bilateral    COLONOSCOPY  08/2013   HAND SURGERY Right    carpel tunnel surgery   INJECTION KNEE Bilateral 10/30/2021   Procedure: BILATERAL KNEE INJECTIONS;  Surgeon: Tarry Kos, MD;  Location: [facility];  Service: Orthopedics;  Laterality:  Bilateral;   MULTIPLE TOOTH EXTRACTIONS     full dentures   SHOULDER ARTHROSCOPY WITH ROTATOR CUFF REPAIR AND SUBACROMIAL DECOMPRESSION Right 10/30/2021   Procedure: RIGHT SHOULDER ARTHROSCOPY WITH ROTATOR CUFF REPAIR AND SUBACROMIAL DECOMPRESSION, DEBRIDEMENT;  Surgeon: Tarry Kos, MD;  Location: [facility];  Service: Orthopedics;  Laterality: Right;   TOTAL KNEE ARTHROPLASTY Left 02/08/2024   Procedure: LEFT TOTAL KNEE REPLACEMENT;  Surgeon: Tarry Kos, MD;  Location: [facility];  Service: Orthopedics;  Laterality: Left;   TRIGGER FINGER RELEASE Bilateral    thumbs   Active Ambulatory Problems    Diagnosis Date Noted   Schizoaffective disorder, bipolar type (HCC) 02/04/2018   GAD (generalized anxiety disorder) 01/24/2020   Complete tear of right rotator cuff 10/30/2021   Primary osteoarthritis of right knee 01/28/2022   Primary osteoarthritis of left knee 01/28/2022   Dysphagia 11/02/2022   Chronic bilateral low back pain without sciatica 06/25/2023   Anxiety    Back pain    Barrett's esophagus    Chronic headaches    Constipation    Depression    Dry mouth    Fibromyalgia    GERD (gastroesophageal reflux disease)    Heart murmur    Hyperlipemia    Insomnia    Myoclonus    Osteoarthritis    Schizoaffective disorder (HCC)    SVD (spontaneous vaginal delivery)    Vertigo    Wears dentures    Acute sinusitis 02/06/2016   Age-related nuclear cataract of both eyes 01/26/2017   Cortical senile cataract of both eyes 01/26/2017   Allergic rhinitis due to pollen 02/07/2016   Bilateral presbyopia 01/26/2017   Brachial neuritis or radiculitis 02/06/2016   Cervical neuritis 02/06/2016   Glossitis 02/07/2016   Myositis 02/07/2016   Plantar fasciitis 02/07/2016   Bursitis of left hip 02/06/2016   Corneal dystrophy, anterior 01/26/2017   DDD (degenerative disc disease), cervical 02/07/2016   DDD (degenerative disc disease), lumbosacral 01/03/2014   Dermatitis,  eczematoid 02/06/2016   Disorder of bursae and tendons in shoulder region 02/06/2016   Enthesopathy of ankle and tarsus 02/06/2016   Fainting 02/06/2016   Generalized osteoarthrosis 09/15/2013   Joint pain, knee 02/07/2016   Menopausal disorder 02/07/2016   Morbid obesity (HCC) 02/07/2016   Obesity 02/07/2016   Migraine headache 02/07/2016   Myalgia 02/07/2016   Periodic limb movement disorder 02/07/2016   Restless leg syndrome 02/07/2016   Referred otalgia 02/07/2016   Shoulder pain 02/07/2016   Toxic effect of secondhand tobacco smoke 02/07/2016   Type II diabetes mellitus (HCC) 04/07/2014   Vaginal atrophy 02/07/2016   Vulvar atrophy 02/07/2016   Auditory hallucinations 07/08/2016   Visual hallucination 07/08/2016   Angina pectoris (HCC) 09/03/2023   Cardiac murmur 09/03/2023   Status post total left knee replacement 02/08/2024   Resolved Ambulatory Problems    Diagnosis Date Noted   No Resolved Ambulatory Problems   Past Medical History:  Diagnosis Date   Hypertension    Constitutional Exam  General appearance: Well nourished, well developed, and well hydrated. In no apparent acute distress Vitals:   03/17/24 1123  BP: 116/82  Pulse: 70  Temp: 97.7 F (36.5 C)  SpO2: 95%  Weight: 230 lb (104.3 kg)  Height: 5\' 4"  (1.626 m)   BMI Assessment: Estimated body mass index is 39.48 kg/m as calculated from the following:   Height as of this encounter: 5\' 4"  (1.626 m).   Weight as of this encounter: 230 lb (104.3 kg).  BMI interpretation table: BMI level Category Range association with higher incidence of chronic pain  <18 kg/m2 Underweight   18.5-24.9 kg/m2 Ideal body weight   25-29.9 kg/m2 Overweight Increased incidence by 20%  30-34.9 kg/m2 Obese (Class I) Increased incidence by 68%  35-39.9 kg/m2 Severe obesity (Class II) Increased incidence by 136%  >40 kg/m2 Extreme obesity (Class III) Increased incidence by 254%   Patient's current BMI Ideal Body weight   Body  mass index is 39.48 kg/m. Ideal body weight: 54.7 kg (120 lb 9.5 oz) Adjusted ideal body weight: 74.6 kg (164 lb 5.7 oz)   BMI Readings from Last 4 Encounters:  03/17/24 39.48 kg/m  02/08/24 38.11 kg/m  01/28/24 39.14 kg/m  09/03/23 36.56 kg/m   Wt Readings from Last 4 Encounters:  03/17/24 230 lb (104.3 kg)  02/08/24 222 lb (100.7 kg)  01/28/24 228 lb (103.4 kg)  09/03/23 213 lb (96.6 kg)    Psych/Mental status: Alert, oriented x 3 (person, place, & time)       Eyes: PERLA Respiratory: No evidence of acute respiratory distress  Lumbar Spine Area Exam  Skin & Axial Inspection: No masses, redness, or swelling Alignment: Symmetrical Functional ROM: Pain restricted ROM       Stability: No instability detected Muscle Tone/Strength: Functionally intact. No obvious neuro-muscular anomalies detected. Sensory (Neurological): Dermatomal pain pattern R>L Palpation: No palpable anomalies       Provocative Tests: Hyperextension/rotation test: (+) due to pain. Lumbar quadrant test (Kemp's test): (+) on the right for foraminal stenosis  Gait & Posture Assessment  Ambulation: Limited Gait: Antalgic gait (limping) Posture: Difficulty standing up straight, due to pain  Lower Extremity Exam    Side: Right lower extremity  Side: Left lower extremity  Stability: No instability observed          Stability: No instability observed          Skin & Extremity Inspection: Skin color, temperature, and hair growth are WNL. No peripheral edema or cyanosis. No masses, redness, swelling, asymmetry, or associated skin lesions. No contractures.  Skin & Extremity Inspection: Skin color, temperature, and hair growth are WNL. No peripheral edema or cyanosis. No masses, redness, swelling, asymmetry, or associated skin lesions. No contractures.  Functional ROM: Pain restricted ROM for hip and knee joints          Functional ROM: Unrestricted ROM                  Muscle Tone/Strength: Functionally  intact. No obvious neuro-muscular anomalies detected.  Muscle Tone/Strength: Functionally intact. No obvious neuro-muscular anomalies detected.  Sensory (Neurological): Dermatomal pain pattern        Sensory (Neurological): Unimpaired        DTR: Patellar: deferred today Achilles: deferred today Plantar: deferred today  DTR: Patellar: deferred today Achilles: deferred today Plantar: deferred today  Palpation: No palpable anomalies  Palpation: No palpable anomalies    Assessment  Primary Diagnosis & Pertinent Problem List: The primary encounter diagnosis was Chronic radicular lumbar pain. Diagnoses of Lumbar radiculopathy, Lumbar facet arthropathy, Spinal stenosis, lumbar region, with neurogenic claudication, and Chronic pain syndrome were also pertinent to this visit.  Visit Diagnosis (New problems to examiner): 1. Chronic radicular lumbar pain   2. Lumbar radiculopathy   3. Lumbar facet arthropathy   4. Spinal stenosis, lumbar region, with neurogenic claudication   5. 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 The condition has persisted for nine to ten months, with severe arthritis and disc herniation confirmed by imaging. Pain radiates down the right leg. She has not yet tried physical therapy and has been using acetaminophen for management. Treatment will include physical therapy and an epidural steroid injection to reduce inflammation and alleviate  leg pain. One injection may not resolve all issues due to multiple spinal problems. If leg pain improves but back pain persists, further therapies will be considered. She will be referred to physical therapy at a rehabilitation center in [facility]. An epidural steroid injection will be scheduled, with diazepam administered  beforehand for anxiety management. She must have a driver and fast for six hours before the procedure.  Lumbar spondylosis   Chronic lumbar spondylosis with disc bulges at multiple levels contributes to back pain. Her weight exacerbates the condition by increasing load and pressure on the spinal discs. Physical therapy is recommended to manage symptoms and improve spinal health. Weight management strategies will be discussed to reduce spinal load. Additional therapies will be considered if back pain persists after addressing leg pain.  A lumbar facet nerve block is considered to assess pain relief. If significant relief is achieved, radiofrequency ablation or a spinal cord stimulator may be considered. Medications, including tramadol, will be managed, and a follow-up in three months is scheduled for medication management.      Referral Orders         Ambulatory referral to Physical Therapy     Procedure Orders         Lumbar Transforaminal Epidural       Interventional management options: Ms. Hagg was informed that there is no guarantee that she would be a candidate for interventional therapies. The decision will be based on the results of diagnostic studies, as well as Ms. Gougeon's risk profile.  Procedure(s) under consideration:  Lumbar ESI Lumbar facets SIJ   Provider-requested follow-up: Return in about 27 days (around 04/13/2024) for B/L L4 and L5 TF ESI, in clinic (PO Valium 5mg ).  Future Appointments  Date Time Provider Department Center  03/22/2024 10:00 AM Cristie Hem, PA-C [facility] None   I discussed the assessment and treatment plan with the patient. The patient was provided an opportunity to ask questions and all were answered. The patient agreed with the plan and demonstrated an understanding of the instructions.  Patient advised to call back or seek an in-person evaluation if the symptoms or condition worsens.  Duration of encounter: .  Total time on  encounter, as per AMA guidelines included both the face-to-face and non-face-to-face time personally spent by the physician and/or other qualified health care professional(s) on the day of the encounter (includes time in activities that require the physician or other qualified health care professional and does not include time in activities normally performed by clinical staff). Physician's time may include the following activities when performed: Preparing to see the patient (e.g., pre-charting review of records, searching for previously ordered imaging, lab work, and nerve conduction tests) Review of prior analgesic pharmacotherapies. Reviewing PMP Interpreting ordered tests (e.g., lab work, imaging, nerve conduction tests) Performing post-procedure evaluations, including interpretation of diagnostic procedures Obtaining and/or reviewing separately obtained history Performing a medically appropriate examination and/or evaluation Counseling and educating the patient/family/caregiver Ordering medications, tests, or procedures Referring and communicating with other health care professionals (when not separately reported) Documenting clinical information in the electronic or other health record Independently interpreting results (not separately reported) and communicating results to the patient/ family/caregiver Care coordination (not separately reported)  Note by: Bridget Jolly, MD (TTS and AI technology used. I apologize for any typographical errors that were not detected and corrected.) Date: 03/17/2024; Time: 1:16 PM

## 2024-03-17 NOTE — Patient Instructions (Signed)
Epidural Steroid Injection ?Patient Information ? ?Description: The epidural space surrounds the nerves as they exit the spinal cord.  In some patients, the nerves can be compressed and inflamed by a bulging disc or a tight spinal canal (spinal stenosis).  By injecting steroids into the epidural space, we can bring irritated nerves into direct contact with a potentially helpful medication.  These steroids act directly on the irritated nerves and can reduce swelling and inflammation which often leads to decreased pain.  Epidural steroids may be injected anywhere along the spine and from the neck to the low back depending upon the location of your pain. ?  After numbing the skin with local anesthetic (like Novocaine), a small needle is passed into the epidural space slowly.  You may experience a sensation of pressure while this is being done.  The entire block usually last less than 10 minutes. ? ?Conditions which may be treated by epidural steroids: ? ?Low back and leg pain ?Neck and arm pain ?Spinal stenosis ?Post-laminectomy syndrome ?Herpes zoster (shingles) pain ?Pain from compression fractures ? ?Preparation for the injection: ? ?Do not eat any solid food or dairy products within 8 hours of your appointment.  ?You may drink clear liquids up to 3 hours before appointment.  Clear liquids include water, black coffee, juice or soda.  No milk or cream please. ?You may take your regular medication, including pain medications, with a sip of water before your appointment  Diabetics should hold regular insulin (if taken separately) and take 1/2 normal NPH dos the morning of the procedure.  Carry some sugar containing items with you to your appointment. ?A driver must accompany you and be prepared to drive you home after your procedure.  ?Bring all your current medications with your. ?An IV may be inserted and sedation may be given at the discretion of the physician.   ?A blood pressure cuff, EKG and other monitors will  often be applied during the procedure.  Some patients may need to have extra oxygen administered for a short period. ?You will be asked to provide medical information, including your allergies, prior to the procedure.  We must know immediately if you are taking blood thinners (like Coumadin/Warfarin)  Or if you are allergic to IV iodine contrast (dye). We must know if you could possible be pregnant. ? ?Possible side-effects: ?Bleeding from needle site ?Infection (rare, may require surgery) ?Nerve injury (rare) ?Numbness & tingling (temporary) ?Difficulty urinating (rare, temporary) ?Spinal headache ( a headache worse with upright posture) ?Light -headedness (temporary) ?Pain at injection site (several days) ?Decreased blood pressure (temporary) ?Weakness in arm/leg (temporary) ?Pressure sensation in back/neck (temporary) ? ?Call if you experience: ?Fever/chills associated with headache or increased back/neck pain. ?Headache worsened by an upright position. ?New onset weakness or numbness of an extremity below the injection site ?Hives or difficulty breathing (go to the emergency room) ?Inflammation or drainage at the infection site ?Severe back/neck pain ?Any new symptoms which are concerning to you ? ?Please note: ? ?Although the local anesthetic injected can often make your back or neck feel good for several hours after the injection, the pain will likely return.  It takes 3-7 days for steroids to work in the epidural space.  You may not notice any pain relief for at least that one week. ? ?If effective, we will often do a series of three injections spaced 3-6 weeks apart to maximally decrease your pain.  After the initial series, we generally will wait several months before   considering a repeat injection of the same type. ? ?If you have any questions, please call [phone] ?[facility] ?

## 2024-03-22 ENCOUNTER — Encounter: Payer: Medicare (Managed Care) | Admitting: Physician Assistant

## 2024-04-20 ENCOUNTER — Ambulatory Visit
Admission: RE | Admit: 2024-04-20 | Discharge: 2024-04-20 | Disposition: A | Discharge status: Home / Self Care | Payer: Medicare (Managed Care) | Source: Ambulatory Visit | Attending: Student in an Organized Health Care Education/Training Program | Admitting: Student in an Organized Health Care Education/Training Program

## 2024-04-20 ENCOUNTER — Encounter: Payer: Self-pay | Admitting: Student in an Organized Health Care Education/Training Program

## 2024-04-20 ENCOUNTER — Ambulatory Visit
Payer: Medicare (Managed Care) | Attending: Student in an Organized Health Care Education/Training Program | Admitting: Student in an Organized Health Care Education/Training Program

## 2024-04-20 DIAGNOSIS — M48062 Spinal stenosis, lumbar region with neurogenic claudication: Secondary | ICD-10-CM

## 2024-04-20 DIAGNOSIS — M5416 Radiculopathy, lumbar region: Secondary | ICD-10-CM

## 2024-04-20 DIAGNOSIS — G8929 Other chronic pain: Secondary | ICD-10-CM | POA: Diagnosis present

## 2024-04-20 DIAGNOSIS — G894 Chronic pain syndrome: Secondary | ICD-10-CM | POA: Diagnosis present

## 2024-04-20 MED ORDER — SODIUM CHLORIDE (PF) 0.9 % IJ SOLN
INTRAMUSCULAR | Status: AC
  Filled 2024-04-20: qty 10

## 2024-04-20 MED ORDER — LIDOCAINE HCL 2 % IJ SOLN
20.0000 mL | Freq: Once | INTRAMUSCULAR | Status: AC
  Administered 2024-04-20: 400 mg

## 2024-04-20 MED ORDER — DEXAMETHASONE SODIUM PHOSPHATE 10 MG/ML IJ SOLN
20.0000 mg | Freq: Once | INTRAMUSCULAR | Status: AC
  Administered 2024-04-20: 20 mg

## 2024-04-20 MED ORDER — DIAZEPAM 5 MG PO TABS
5.0000 mg | ORAL_TABLET | ORAL | Status: AC
  Administered 2024-04-20: 5 mg via ORAL

## 2024-04-20 MED ORDER — DEXAMETHASONE SODIUM PHOSPHATE 10 MG/ML IJ SOLN
INTRAMUSCULAR | Status: AC
  Filled 2024-04-20: qty 3

## 2024-04-20 MED ORDER — DEXAMETHASONE SODIUM PHOSPHATE 10 MG/ML IJ SOLN
10.0000 mg | Freq: Once | INTRAMUSCULAR | Status: AC
  Administered 2024-04-20: 10 mg

## 2024-04-20 MED ORDER — IOHEXOL 180 MG/ML  SOLN
10.0000 mL | Freq: Once | INTRAMUSCULAR | Status: AC
  Administered 2024-04-20: 10 mL via EPIDURAL

## 2024-04-20 MED ORDER — DIAZEPAM 5 MG PO TABS
ORAL_TABLET | ORAL | Status: AC
  Filled 2024-04-20: qty 1

## 2024-04-20 MED ORDER — ROPIVACAINE HCL 2 MG/ML IJ SOLN
9.0000 mL | Freq: Once | INTRAMUSCULAR | Status: AC
  Administered 2024-04-20: 9 mL via PERINEURAL

## 2024-04-20 MED ORDER — LIDOCAINE HCL 2 % IJ SOLN
INTRAMUSCULAR | Status: AC
Start: 2024-04-20 — End: ?
  Filled 2024-04-20: qty 20

## 2024-04-20 MED ORDER — IOHEXOL 180 MG/ML  SOLN
INTRAMUSCULAR | Status: AC
  Filled 2024-04-20: qty 20

## 2024-04-20 MED ORDER — ROPIVACAINE HCL 2 MG/ML IJ SOLN
INTRAMUSCULAR | Status: AC
  Filled 2024-04-20: qty 20

## 2024-04-20 NOTE — Patient Instructions (Signed)
____________________________________________________________________________________________  Post-Procedure Discharge Instructions  Instructions: Apply ice:  Purpose: This will minimize any swelling and discomfort after procedure.  When: Day of procedure, as soon as you get home. How: Fill a plastic sandwich bag with crushed ice. Cover it with a small towel and apply to injection site. How long: (15 min on, 15 min off) Apply for 15 minutes then remove x 15 minutes.  Repeat sequence on day of procedure, until you go to bed. Apply heat:  Purpose: To treat any soreness and discomfort from the procedure. When: Starting the next day after the procedure. How: Apply heat to procedure site starting the day following the procedure. How long: May continue to repeat daily, until discomfort goes away. Food intake: Start with clear liquids (like water) and advance to regular food, as tolerated.  Physical activities: Keep activities to a minimum for the first 8 hours after the procedure. After that, then as tolerated. Driving: If you have received any sedation, be responsible and do not drive. You are not allowed to drive for 24 hours after having sedation. Blood thinner: (Applies only to those taking blood thinners) You may restart your blood thinner 6 hours after your procedure. Insulin: (Applies only to Diabetic patients taking insulin) As soon as you can eat, you may resume your normal dosing schedule. Infection prevention: Keep procedure site clean and dry. Shower daily and clean area with soap and water. Post-procedure Pain Diary: Extremely important that this be done correctly and accurately. Recorded information will be used to determine the next step in treatment. For the purpose of accuracy, follow these rules: Evaluate only the area treated. Do not report or include pain from an untreated area. For the purpose of this evaluation, ignore all other areas of pain, except for the treated area. After  your procedure, avoid taking a long nap and attempting to complete the pain diary after you wake up. Instead, set your alarm clock to go off every hour, on the hour, for the initial 8 hours after the procedure. Document the duration of the numbing medicine, and the relief you are getting from it. Do not go to sleep and attempt to complete it later. It will not be accurate. If you received sedation, it is likely that you were given a medication that may cause amnesia. Because of this, completing the diary at a later time may cause the information to be inaccurate. This information is needed to plan your care. Follow-up appointment: Keep your post-procedure follow-up evaluation appointment after the procedure (usually 2 weeks for most procedures, 6 weeks for radiofrequencies). DO NOT FORGET to bring you pain diary with you.   Expect: (What should I expect to see with my procedure?) From numbing medicine (AKA: Local Anesthetics): Numbness or decrease in pain. You may also experience some weakness, which if present, could last for the duration of the local anesthetic. Onset: Full effect within 15 minutes of injected. Duration: It will depend on the type of local anesthetic used. On the average, 1 to 8 hours.  From steroids (Applies only if steroids were used): Decrease in swelling or inflammation. Once inflammation is improved, relief of the pain will follow. Onset of benefits: Depends on the amount of swelling present. The more swelling, the longer it will take for the benefits to be seen. In some cases, up to 10 days. Duration: Steroids will stay in the system x 2 weeks. Duration of benefits will depend on multiple posibilities including persistent irritating factors. Side-effects: If present, they   may typically last 2 weeks (the duration of the steroids). Frequent: Cramps (if they occur, drink Gatorade and take over-the-counter Magnesium 450-500 mg once to twice a day); water retention with temporary  weight gain; increases in blood sugar; decreased immune system response; increased appetite. Occasional: Facial flushing (red, warm cheeks); mood swings; menstrual changes. Uncommon: Long-term decrease or suppression of natural hormones; bone thinning. (These are more common with higher doses or more frequent use. This is why we prefer that our patients avoid having any injection therapies in other practices.)  Very Rare: Severe mood changes; psychosis; aseptic necrosis. From procedure: Some discomfort is to be expected once the numbing medicine wears off. This should be minimal if ice and heat are applied as instructed.  Call if: (When should I call?) You experience numbness and weakness that gets worse with time, as opposed to wearing off. New onset bowel or bladder incontinence. (Applies only to procedures done in the spine)  Emergency Numbers: Durning business hours (Monday - Thursday, 8:00 AM - 4:00 PM) (Friday, 9:00 AM - 12:00 Noon): [phone] After hours: [phone] NOTE: If you are having a problem and are unable connect with, or to talk to a provider, then go to your nearest urgent care or emergency department. If the problem is serious and urgent, please call [phone]. ____________________________________________________________________________________________  Selective Nerve Root Block Patient Information  Description: Specific nerve roots exit the spinal canal and these nerves can be compressed and inflamed by a bulging disc and bone spurs.  By injecting steroids on the nerve root, we can potentially decrease the inflammation surrounding these nerves, which often leads to decreased pain.  Also, by injecting local anesthesia on the nerve root, this can provide us helpful information to give to your referring doctor if it decreases your pain.  Selective nerve root blocks can be done along the spine from the neck to the low back depending on the location of your pain.   After numbing  the skin with local anesthesia, a small needle is passed to the nerve root and the position of the needle is verified using x-ray pictures.  After the needle is in correct position, we then deposit the medication.  You may experience a pressure sensation while this is being done.  The entire block usually lasts less than 15 minutes.  Conditions that may be treated with selective nerve root blocks: Low back and leg pain Spinal stenosis Diagnostic block prior to potential surgery Neck and arm pain Post laminectomy syndrome  Preparation for the injection:  Do not eat any solid food or dairy products within 8 hours of your appointment. You may drink clear liquids up to 3 hours before an appointment.  Clear liquids include water, black coffee, juice or soda.  No milk or cream please. You may take your regular medications, including pain medications, with a sip of water before your appointment.  Diabetics should hold regular insulin (if taken separately) and take 1/2 normal NPH dose the morning of the procedure.  Carry some sugar containing items with you to your appointment. A driver must accompany you and be prepared to drive you home after your procedure. Bring all your current medications with you. An IV may be inserted and sedation may be given at the discretion of the physician. A blood pressure cuff, EKG, and other monitors will often be applied during the procedure.  Some patients may need to have extra oxygen administered for a short period. You will be asked to provide   medical information, including allergies, prior to the procedure.  We must know immediately if you are taking blood  Thinners (like Coumadin) or if you are allergic to IV iodine contrast (dye).  Possible side-effects: All are usually temporary Bleeding from needle site Light headedness Numbness and tingling Decreased blood pressure Weakness in arms/legs Pressure sensation in back/neck Pain at injection site (several  days)  Possible complications: All are extremely rare Infection Nerve injury Spinal headache (a headache wore with upright position)  Call if you experience: Fever/chills associated with headache or increased back/neck pain Headache worsened by an upright position New onset weakness or numbness of an extremity below the injection site Hives or difficulty breathing (go to the emergency room) Inflammation or drainage at the injection site(s) Severe back/neck pain greater than usual New symptoms which are concerning to you  Please note:  Although the local anesthetic injected can often make your back or neck feel good for several hours after the injection the pain will likely return.  It takes 3-5 days for steroids to work on the nerve root. You may not notice any pain relief for at least one week.  If effective, we will often do a series of 3 injections spaced 3-6 weeks apart to maximally decrease your pain.    If you have any questions, please call [phone] [facility] 

## 2024-04-20 NOTE — Progress Notes (Signed)
 PROVIDER NOTE: Interpretation of information contained herein should be left to medically-trained personnel. Specific patient instructions are provided elsewhere under "Patient Instructions" section of medical record. This document was created in part using STT-dictation technology, any transcriptional errors that may result from this process are unintentional.  Patient: Bridget [name] Type: Established DOB: Jun 14, 1956 MRN: [id number] PCP: Sarrah Cure, DO  Service: Procedure DOS: 04/20/2024 Setting: Ambulatory Location: Ambulatory outpatient facility Delivery: Face-to-face Provider: Cephus Collin, MD Specialty: Interventional Pain Management Specialty designation: 09 Location: Outpatient facility Ref. Prov.: Cephus Collin, MD       Interventional Therapy   Procedure: Lumbar trans-foraminal epidural steroid injection (L-TFESI) #1  Laterality: Bilateral (-50)  Level: L4& L5 nerve root(s) Imaging: Fluoroscopy-guided         Anesthesia: Local anesthesia (1-2% Lidocaine ) Sedation: Minimal Sedation                       DOS: 04/20/2024  Performed by: Cephus Collin, MD  Purpose: Diagnostic/Therapeutic Indications: Lumbar radicular pain severe enough to impact quality of life or function. 1. Chronic radicular lumbar pain   2. Lumbar radiculopathy   3. Spinal stenosis, lumbar region, with neurogenic claudication   4. Chronic pain syndrome    NAS-11 Pain score:   Pre-procedure: 8 /10   Post-procedure: 8 /10     Position / Prep / Materials:  Position: Prone  Prep solution: ChloraPrep (2% chlorhexidine  gluconate and 70% isopropyl alcohol) Prep Area: Entire Posterior Lumbosacral Area.  From the lower tip of the scapula down to the tailbone and from flank to flank. Materials:  Tray: Block Needle(s):  Type: Spinal  Gauge (G): 22  Length: 3.5-in  Qty: 2     H&P (Pre-op Assessment):  Bridget [name] is a 68 y.o. (year old), female patient, seen today for interventional treatment. 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 Bridget [name] has a current medication list which includes the following prescription(s): acetaminophen , aripiprazole , calcium  carb-cholecalciferol, cequa, duloxetine , latanoprost, lidocaine , metoprolol  tartrate, pantoprazole , polyethylene glycol, quetiapine , rosuvastatin , senna, belsomra, temazepam, valbenazine , apixaban , doxycycline , gabapentin, methocarbamol , and oxycodone . Her primarily concern today is the Back Pain  Initial Vital Signs:  Pulse/HCG Rate: 77ECG Heart Rate: 72 Temp: (!) 97.3 F (36.3 C) Resp: 16 BP: (!) 155/80 SpO2: 100 %  BMI: Estimated body mass index is 39.48 kg/m as calculated from the following:   Height as of this encounter: 5\' 4"  (1.626 m).   Weight as of this encounter: 230 lb (104.3 kg).  Risk Assessment: Allergies: Reviewed. She is allergic to flexeril [cyclobenzaprine], amitriptyline, and penicillins.  Allergy Precautions: None required Coagulopathies: Reviewed. None identified.  Blood-thinner therapy: None at this time Active Infection(s): Reviewed. None identified. Bridget [name] is afebrile  Site Confirmation: Bridget [name] was asked to confirm the procedure and laterality before marking the site Procedure checklist: Completed Consent: Before the procedure and under the influence of no sedative(s), amnesic(s), or anxiolytics, the patient was informed of the treatment options, risks and possible complications. To fulfill our ethical and legal  obligations, as recommended by the American Medical Association's Code of Ethics, I have informed the patient of my clinical impression; the nature and purpose  of the treatment or procedure; the risks, benefits, and possible complications of the intervention; the alternatives, including doing nothing; the risk(s) and benefit(s) of the alternative treatment(s) or procedure(s); and the risk(s) and benefit(s) of doing nothing. The patient was provided information about the general risks and possible complications associated with the procedure. These may include, but are not limited to: failure to achieve desired goals, infection, bleeding, organ or nerve damage, allergic reactions, paralysis, and death. In addition, the patient was informed of those risks and complications associated to Spine-related procedures, such as failure to decrease pain; infection (i.e.: Meningitis, epidural or intraspinal abscess); bleeding (i.e.: epidural hematoma, subarachnoid hemorrhage, or any other type of intraspinal or peri-dural bleeding); organ or nerve damage (i.e.: Any type of peripheral nerve, nerve root, or spinal cord injury) with subsequent damage to sensory, motor, and/or autonomic systems, resulting in permanent pain, numbness, and/or weakness of one or several areas of the body; allergic reactions; (i.e.: anaphylactic reaction); and/or death. Furthermore, the patient was informed of those risks and complications associated with the medications. These include, but are not limited to: allergic reactions (i.e.: anaphylactic or anaphylactoid reaction(s)); adrenal axis suppression; blood sugar elevation that in diabetics may result in ketoacidosis or comma; water retention that in patients with history of congestive heart failure may result in shortness of breath, pulmonary edema, and decompensation with resultant heart failure; weight gain; swelling or edema; medication-induced neural toxicity; particulate matter embolism and  blood vessel occlusion with resultant organ, and/or nervous system infarction; and/or aseptic necrosis of one or more joints. Finally, the patient was informed that Medicine is not an exact science; therefore, there is also the possibility of unforeseen or unpredictable risks and/or possible complications that may result in a catastrophic outcome. The patient indicated having understood very clearly. We have given the patient no guarantees and we have made no promises. Enough time was given to the patient to ask questions, all of which were answered to the patient's satisfaction. Ms. Dunavan has indicated that she wanted to continue with the procedure. Attestation: I, the ordering provider, attest that I have discussed with the patient the benefits, risks, side-effects, alternatives, likelihood of achieving goals, and potential problems during recovery for the procedure that I have provided informed consent. Date  Time: 04/20/2024  8:36 AM  Pre-Procedure Preparation:  Monitoring: As per clinic protocol. Respiration, ETCO2, SpO2, BP, heart rate and rhythm monitor placed and checked for adequate function Safety Precautions: Patient was assessed for positional comfort and pressure points before starting the procedure. Time-out: I initiated and conducted the "Time-out" before starting the procedure, as per protocol. The patient was asked to participate by confirming the accuracy of the "Time Out" information. Verification of the correct person, site, and procedure were performed and confirmed by me, the nursing staff, and the patient. "Time-out" conducted as per Joint Commission's Universal Protocol (UP.01.01.01). Time: 0913 Start Time: 0913 hrs.  Description/Narrative of Procedure:          Target: The 6 o'clock position under the pedicle, on the affected side. Region: Posterolateral Lumbosacral Approach: Posterior Percutaneous Paravertebral approach.  Rationale (medical necessity): procedure needed  and proper for the diagnosis and/or treatment of the patient's medical symptoms and needs. Procedural Technique Safety Precautions: Aspiration looking for blood return was conducted prior to all injections. At no point did we inject any substances, as a needle was being advanced. No attempts were made at seeking any paresthesias. Safe injection practices and needle disposal techniques used. Medications properly checked for expiration  dates. SDV (single dose vial) medications used. Description of the Procedure: Protocol guidelines were followed. The patient was placed in position over the procedure table. The target area was identified and the area prepped in the usual manner. Skin & deeper tissues infiltrated with local anesthetic. Appropriate amount of time allowed to pass for local anesthetics to take effect. The procedure needles were then advanced to the target area. Proper needle placement secured. Negative aspiration confirmed. Solution injected in intermittent fashion, asking for systemic symptoms every 0.5cc of injectate. The needles were then removed and the area cleansed, making sure to leave some of the prepping solution back to take advantage of its long term bactericidal properties.  9 cc of Ropivacaine  + 3 cc of Decadron  10 mg/cc. 12 cc total 3 cc injected for the left and right L4 nerve 3 cc injected for the left and right L5 nerve  Vitals:   04/20/24 0914 04/20/24 0920 04/20/24 0925 04/20/24 0928  BP: (!) 146/90 (!) 144/89 (!) 146/90 (!) 153/97  Pulse:      Resp: 18 16 18 18   Temp:      TempSrc:      SpO2: 99% 100% 97% 97%  Weight:      Height:        Start Time: 0913 hrs. End Time: 0928 hrs.  Imaging Guidance (Spinal):          Type of Imaging Technique: Fluoroscopy Guidance (Spinal) Indication(s): Fluoroscopy guidance for needle placement to enhance accuracy in procedures requiring precise needle localization for targeted delivery of medication in or near specific  anatomical locations not easily accessible without such real-time imaging assistance. Exposure Time: Please see nurses notes. Contrast: Before injecting any contrast, we confirmed that the patient did not have an allergy to iodine , shellfish, or radiological contrast. Once satisfactory needle placement was completed at the desired level, radiological contrast was injected. Contrast injected under live fluoroscopy. No contrast complications. See chart for type and volume of contrast used. Fluoroscopic Guidance: I was personally present during the use of fluoroscopy. "Tunnel Vision Technique" used to obtain the best possible view of the target area. Parallax error corrected before commencing the procedure. "Direction-depth-direction" technique used to introduce the needle under continuous pulsed fluoroscopy. Once target was reached, antero-posterior, oblique, and lateral fluoroscopic projection used confirm needle placement in all planes. Images permanently stored in EMR. Interpretation: I personally interpreted the imaging intraoperatively. Adequate needle placement confirmed in multiple planes. Appropriate spread of contrast into desired area was observed. No evidence of afferent or efferent intravascular uptake. No intrathecal or subarachnoid spread observed. Permanent images saved into the patient's record.  Post-operative Assessment:  Post-procedure Vital Signs:  Pulse/HCG Rate: 7778 Temp: (!) 97.3 F (36.3 C) Resp: 18 BP: (!) 153/97 SpO2: 97 %  EBL: None  Complications: No immediate post-treatment complications observed by team, or reported by patient.  Note: The patient tolerated the entire procedure well. A repeat set of vitals were taken after the procedure and the patient was kept under observation following institutional policy, for this type of procedure. Post-procedural neurological assessment was performed, showing return to baseline, prior to discharge. The patient was provided with  post-procedure discharge instructions, including a section on how to identify potential problems. Should any problems arise concerning this procedure, the patient was given instructions to immediately contact us , at any time, without hesitation. In any case, we plan to contact the patient by telephone for a follow-up status report regarding this interventional procedure.  Comments:  No additional  relevant information.  Plan of Care (POC)  Orders:  Orders Placed This Encounter  Procedures   DG PAIN CLINIC C-ARM 1-60 MIN NO REPORT    Intraoperative interpretation by procedural physician at [facility].    Standing Status:   Standing    Number of Occurrences:   1    Reason for exam::   Assistance in needle guidance and placement for procedures requiring needle placement in or near specific anatomical locations not easily accessible without such assistance.    Medications ordered for procedure: Meds ordered this encounter  Medications   iohexol  (OMNIPAQUE ) 180 MG/ML injection 10 mL    Must be Myelogram-compatible. If not available, you may substitute with a water-soluble, non-ionic, hypoallergenic, myelogram-compatible radiological contrast medium.   lidocaine  (XYLOCAINE ) 2 % (with pres) injection 400 mg   diazepam (VALIUM) tablet 5 mg    Make sure Flumazenil is available in the pyxis when using this medication. If oversedation occurs, administer 0.2 mg IV over 15 sec. If after 45 sec no response, administer 0.2 mg again over 1 min; may repeat at 1 min intervals; not to exceed 4 doses (1 mg)   dexamethasone  (DECADRON ) injection 20 mg   dexamethasone  (DECADRON ) injection 10 mg   ropivacaine  (PF) 2 mg/mL (0.2%) (NAROPIN ) injection 9 mL   Medications administered: We administered iohexol , lidocaine , diazepam, dexamethasone , dexamethasone , and ropivacaine  (PF) 2 mg/mL (0.2%).  See the medical record for exact dosing, route, and time of administration.  Follow-up plan:   Return in  about 3 weeks (around 05/11/2024) for PPE, F2F.       Recent Visits Date Type Provider Dept  03/17/24 Office Visit Cephus Collin, MD [facility]  Showing recent visits within past 90 days and meeting all other requirements Today's Visits Date Type Provider Dept  04/20/24 Procedure visit Cephus Collin, MD [facility]  Showing today's visits and meeting all other requirements Future Appointments Date Type Provider Dept  05/17/24 Appointment Cephus Collin, MD [facility]  Showing future appointments within next 90 days and meeting all other requirements  Disposition: Discharge home  Discharge (Date  Time): 04/20/2024; 0935 hrs.   Primary Care Physician: Sarrah Cure, DO Location: [facility] Note by: Cephus Collin, MD (TTS technology used. I apologize for any typographical errors that were not detected and corrected.) Date: 04/20/2024; Time: 10:45 AM  Disclaimer:  Medicine is not an Visual merchandiser. The only guarantee in medicine is that nothing is guaranteed. It is important to note that the decision to proceed with this intervention was based on the information collected from the patient. The Data and conclusions were drawn from the patient's questionnaire, the interview, and the physical examination. Because the information was provided in large part by the patient, it cannot be guaranteed that it has not been purposely or unconsciously manipulated. Every effort has been made to obtain as much relevant data as possible for this evaluation. It is important to note that the conclusions that lead to this procedure are derived in large part from the available data. Always take into account that the treatment will also be dependent on availability of resources and existing treatment guidelines, considered by other Pain Management Practitioners as being common knowledge and practice, at the time of the intervention. For Medico-Legal purposes,  it is also important to point out that variation in procedural techniques and pharmacological choices are the acceptable norm. The indications, contraindications, technique, and results of the above procedure should only  be interpreted and judged by a Board-Certified Interventional Pain Specialist with extensive familiarity and expertise in the same exact procedure and technique.

## 2024-04-20 NOTE — Progress Notes (Signed)
 Safety precautions to be maintained throughout the outpatient stay will include: orient to surroundings, keep bed in low position, maintain call bell within reach at all times, provide assistance with transfer out of bed and ambulation.

## 2024-04-21 ENCOUNTER — Telehealth: Payer: Self-pay

## 2024-04-21 NOTE — Telephone Encounter (Signed)
 Attempt to contact for post-procedure follow-up. No answer and left voicemail message.

## 2024-05-17 ENCOUNTER — Ambulatory Visit: Payer: Medicare (Managed Care) | Admitting: Student in an Organized Health Care Education/Training Program

## 2024-05-31 ENCOUNTER — Ambulatory Visit: Payer: Medicare (Managed Care) | Attending: Cardiology | Admitting: Cardiology

## 2024-06-14 ENCOUNTER — Telehealth: Payer: Self-pay

## 2024-06-14 NOTE — Telephone Encounter (Signed)
 Copied from CRM [id number]. Topic: Appointments - Scheduling Inquiry for Clinic >> Jun 09, 2024  1:01 PM Isabell A wrote: Reason for CRM: Randine from [facility] calling to schedule sleep study for patient.   Callback number: [phone]  Patient has been scheduled for a office visit.

## 2024-07-18 ENCOUNTER — Other Ambulatory Visit: Payer: Self-pay | Admitting: Internal Medicine

## 2024-07-18 ENCOUNTER — Encounter: Payer: Self-pay | Admitting: Internal Medicine

## 2024-07-18 DIAGNOSIS — Z1231 Encounter for screening mammogram for malignant neoplasm of breast: Secondary | ICD-10-CM

## 2024-08-02 ENCOUNTER — Other Ambulatory Visit: Payer: Medicare (Managed Care)

## 2024-08-02 ENCOUNTER — Ambulatory Visit (INDEPENDENT_AMBULATORY_CARE_PROVIDER_SITE_OTHER): Payer: Medicare (Managed Care) | Admitting: Gastroenterology

## 2024-08-02 ENCOUNTER — Encounter: Payer: Self-pay | Admitting: Gastroenterology

## 2024-08-02 VITALS — BP 126/70 | HR 62 | Ht 64.0 in | Wt 240.0 lb

## 2024-08-02 DIAGNOSIS — K5900 Constipation, unspecified: Secondary | ICD-10-CM

## 2024-08-02 DIAGNOSIS — K6289 Other specified diseases of anus and rectum: Secondary | ICD-10-CM

## 2024-08-02 DIAGNOSIS — R198 Other specified symptoms and signs involving the digestive system and abdomen: Secondary | ICD-10-CM

## 2024-08-02 DIAGNOSIS — K648 Other hemorrhoids: Secondary | ICD-10-CM

## 2024-08-02 DIAGNOSIS — R1030 Lower abdominal pain, unspecified: Secondary | ICD-10-CM | POA: Diagnosis not present

## 2024-08-02 LAB — CBC WITH DIFFERENTIAL/PLATELET
Basophils Absolute: 0 K/uL (ref 0.0–0.1)
Basophils Relative: 0.6 % (ref 0.0–3.0)
Eosinophils Absolute: 0.1 K/uL (ref 0.0–0.7)
Eosinophils Relative: 1.9 % (ref 0.0–5.0)
HCT: 33.9 % — ABNORMAL LOW (ref 36.0–46.0)
Hemoglobin: 10.8 g/dL — ABNORMAL LOW (ref 12.0–15.0)
Lymphocytes Relative: 37.2 % (ref 12.0–46.0)
Lymphs Abs: 1.5 K/uL (ref 0.7–4.0)
MCHC: 31.8 g/dL (ref 30.0–36.0)
MCV: 92.7 fl (ref 78.0–100.0)
Monocytes Absolute: 0.4 K/uL (ref 0.1–1.0)
Monocytes Relative: 9.1 % (ref 3.0–12.0)
Neutro Abs: 2.1 K/uL (ref 1.4–7.7)
Neutrophils Relative %: 51.2 % (ref 43.0–77.0)
Platelets: 250 K/uL (ref 150.0–400.0)
RBC: 3.65 Mil/uL — ABNORMAL LOW (ref 3.87–5.11)
RDW: 16.9 % — ABNORMAL HIGH (ref 11.5–15.5)
WBC: 4 K/uL (ref 4.0–10.5)

## 2024-08-02 LAB — BASIC METABOLIC PANEL WITH GFR
BUN: 11 mg/dL (ref 6–23)
CO2: 31 meq/L (ref 19–32)
Calcium: 9.2 mg/dL (ref 8.4–10.5)
Chloride: 102 meq/L (ref 96–112)
Creatinine, Ser: 0.91 mg/dL (ref 0.40–1.20)
GFR: 64.49 mL/min (ref >60.00)
Glucose, Bld: 86 mg/dL (ref 70–99)
Potassium: 4.2 meq/L (ref 3.5–5.1)
Sodium: 141 meq/L (ref 135–145)

## 2024-08-02 LAB — TSH: TSH: 2.4 u[IU]/mL (ref 0.35–5.50)

## 2024-08-02 MED ORDER — CALMOL-4 76-10 % RE SUPP: RECTAL | 0 refills | Status: AC

## 2024-08-02 NOTE — Progress Notes (Signed)
 Bridget [name] [id number] 1956-01-20   Chief Complaint: Abdominal pain, rectal pain  Referring Provider: Cloria Annabella CROME, DO Primary GI MD: Dr. Legrand  HPI: Bridget [name] is a 68 y.o. female with past medical history of anxiety/depression, schizoaffective disorder, GERD, Barrett's esophagus, H. pylori gastritis, colon polyps dysphagia, chronic bilateral low back pain, constipation, fibromyalgia, HLD, HTN, migraines, obesity, hysterectomy, chronic normocytic anemia who presents today for a complaint of abdominal and rectal pain.    Patient last seen in office 10/30/2022 by Elida Shawl, NP with complaint of dysphagia.  Was on esomeprazole 40 mg daily.  History of Barrett esophagus and H. pylori per EGD at [facility] 09/2015. Dr. Legrand previously reviewed her 09/2015 EGD records and biopsy result and he questioned if she truly had Barrett's esophagus. She underwent an EGD by Dr. Legrand 01/07/2018 which was normal, no evidence of Barrett's esophagus.  At that time she also endorsed rectal bleeding and rectal pain ongoing for 2 months.  On exam was found to have internal hemorrhoids without prolapse, brown stool in rectal vault guaiac negative.  Was advised to use MiraLAX  as tolerated and apply Desitin cream as needed.  She underwent EGD and colonoscopy 01/2023 with 1 diminutive polyp removed which ended up being polypoid colonic mucosa with mild hyperplastic changes.  EGD was normal and mild esophageal dysmotility suspected to be from GERD and possible side effect of Abilify .  Per Dr. Legrand Continue MiraLAX , encourage plenty of dietary fiber intake, adequate fluids and physical activity to improve constipation. Slightly elevate feet when toileting. I would prefer not to add prescription medications for constipation to this patient's current polypharmacy.   Patient states she has been having bilateral lower abdominal pain for about 6 months.  Pain is achy in nature and is there  constantly.  She denies any aggravating or alleviating factors.  Pain is not affected by eating or by bowel movements.  She rates her pain as 8.5/10 constantly.  Reports that she also has chronic sciatic nerve pain and seems like low back pain and abdominal pain occur at the same time.  She feels like it is together.  She also endorses 5 months of constant rectal pain, which is also achy in nature and worse when she is sitting down.  States she has tried some topical cream without improvement.  Reports that she is having regular bowel movements on daily MiraLAX  and Senokot.  Denies any constipation, diarrhea, blood in her stool, or melena.  She does follow with pain management for her chronic back pain.  Has history of hysterectomy.  Denies any nausea, vomiting, fever, chills, chest pain, shortness of breath.  Previous GI Procedures/Imaging   Colonoscopy 01/29/2023 - One diminutive polyp in the proximal ascending colon, removed with a cold snare. Complete resection. Polyp tissue not retrieved.  - One diminutive polyp in the sigmoid colon, removed with a cold snare. Resected and retrieved.  - Redundant colon. - Melanosis in the colon.  - The examination was otherwise normal. - Recall 5 years Path: Surgical [P], colon, sigmoid, polyp (1) - POLYPOID COLONIC MUCOSA WITH MILD HYPERPLASTIC CHANGES - NEGATIVE FOR DYSPLASIA - MULTIPLE STEP SECTIONS WERE EXAMINED  EGD 01/29/2023 - Normal esophagus.  - Normal stomach.  - Normal examined duodenum.  - No specimens collected. - Suspect benign esophageal dysmotility from GERD and possibly side effect of Abilify .  EGD 01/07/2018: Normal esophagus. No Barrett's esophagus seen. Therefore, no future surveillance EGD needed. - Normal stomach. - Normal examined duodenum. -  No specimens collected.   Colonoscopy 01/07/2018: Two 2 to 4 mm polyps in the transverse colon and in the ascending colon, removed with a cold snare. Resected and  retrieved. - The examination was otherwise normal. - Recall colonoscopy 5 years Surgical [P], transverse and ascending, polyp (2) - TUBULAR ADENOMA (2 OF 4 FRAGMENTS) - BENIGN COLONIC MUCOSA (2 OF 4 FRAGMENTS) - NO HIGH GRADE DYSPLASIA OR MALIGNANCY IDENTIFIED   EGD 09/26/2015;            Colonoscopy 09/18/2014 done for personal history of colon polyps and constipation.  Bowel prep was inadequate.  No polyps were seen.  Erythema and erosion at 55 cm.  Biopsy of the area showed reactive edematous colonic mucosa with erosion.  No dysplasia.   Colonoscopy September 2014.  Extent of exam to the cecum.  Inadequate bowel prep.  10 mm cecal polyp removed.  Path compatible tubular adenoma without high-grade dysplasia.  Past Medical History:  Diagnosis Date   Acute sinusitis 02/06/2016   Age-related nuclear cataract of both eyes 01/26/2017   Allergic rhinitis due to pollen 02/07/2016   Anxiety    Auditory hallucinations 07/08/2016   Back pain    low back - arthritis   Barrett's esophagus    Bilateral presbyopia 01/26/2017   Brachial neuritis or radiculitis 02/06/2016   Bursitis of left hip 02/06/2016   Cervical neuritis 02/06/2016   Chronic bilateral low back pain without sciatica 06/25/2023   Chronic headaches    Complete tear of right rotator cuff 10/30/2021   Constipation    miralax  daily   Corneal dystrophy, anterior 01/26/2017   Cortical senile cataract of both eyes 01/26/2017   DDD (degenerative disc disease), cervical 02/07/2016   DDD (degenerative disc disease), lumbosacral 01/03/2014   Depression    Dermatitis, eczematoid 02/06/2016   Disorder of bursae and tendons in shoulder region 02/06/2016   Dry mouth    biotene   Dysphagia 11/02/2022   Enthesopathy of ankle and tarsus 02/06/2016   Fainting 02/06/2016   Fibromyalgia    GAD (generalized anxiety disorder) 01/24/2020   Generalized osteoarthrosis 09/15/2013   GERD (gastroesophageal reflux disease)    Glossitis  02/07/2016   Heart murmur    Hyperlipemia    Hypertension    Insomnia    Joint pain, knee 02/07/2016   Menopausal disorder 02/07/2016   Migraine headache 02/07/2016   Morbid obesity (HCC) 02/07/2016   Myalgia 02/07/2016   Myoclonus    patient states no longer an issue   Myositis 02/07/2016   Obesity 02/07/2016   Osteoarthritis    lower back, shoulders, hands   Periodic limb movement disorder 02/07/2016   Plantar fasciitis 02/07/2016   Primary osteoarthritis of left knee 01/28/2022   Primary osteoarthritis of right knee 01/28/2022   Referred otalgia 02/07/2016   Restless leg syndrome 02/07/2016   Schizoaffective disorder (HCC)    Schizoaffective disorder, bipolar type (HCC) 02/04/2018   Shoulder pain 02/07/2016   SVD (spontaneous vaginal delivery)    x 3   Toxic effect of secondhand tobacco smoke 02/07/2016   Vaginal atrophy 02/07/2016   Vertigo    Visual hallucination 07/08/2016   Vulvar atrophy 02/07/2016   Wears dentures    full    Past Surgical History:  Procedure Laterality Date   ABDOMINAL HYSTERECTOMY     BREAST EXCISIONAL BIOPSY Right    BREAST EXCISIONAL BIOPSY Right    BREAST EXCISIONAL BIOPSY Right    BREAST EXCISIONAL BIOPSY Right    BUNIONECTOMY Bilateral  COLONOSCOPY  08/2013   HAND SURGERY Right    carpel tunnel surgery   INJECTION KNEE Bilateral 10/30/2021   Procedure: BILATERAL KNEE INJECTIONS;  Surgeon: Jerri Kay HERO, MD;  Location: [facility];  Service: Orthopedics;  Laterality: Bilateral;   MULTIPLE TOOTH EXTRACTIONS     full dentures   SHOULDER ARTHROSCOPY WITH ROTATOR CUFF REPAIR AND SUBACROMIAL DECOMPRESSION Right 10/30/2021   Procedure: RIGHT SHOULDER ARTHROSCOPY WITH ROTATOR CUFF REPAIR AND SUBACROMIAL DECOMPRESSION, DEBRIDEMENT;  Surgeon: Jerri Kay HERO, MD;  Location: [facility];  Service: Orthopedics;  Laterality: Right;   TOTAL KNEE ARTHROPLASTY Left 02/08/2024   Procedure: LEFT TOTAL KNEE REPLACEMENT;   Surgeon: Jerri Kay HERO, MD;  Location: [facility];  Service: Orthopedics;  Laterality: Left;   TRIGGER FINGER RELEASE Bilateral    thumbs    Current Outpatient Medications  Medication Sig Dispense Refill   acetaminophen  (TYLENOL ) 500 MG tablet Take 500 mg by mouth every 6 (six) hours as needed.     ARIPiprazole  (ABILIFY ) 20 MG tablet Take 20 mg by mouth at bedtime.     Calcium  Carb-Cholecalciferol (CALCIUM  600+D3) 600-10 MG-MCG TABS Take 1 tablet by mouth in the morning and at bedtime.     CEQUA 0.09 % SOLN Place 1 drop into both eyes every 12 (twelve) hours.     DULoxetine  (CYMBALTA ) 60 MG capsule Take 120 mg by mouth in the morning.     gabapentin (NEURONTIN) 100 MG capsule Take 100-200 mg by mouth at bedtime.     latanoprost (XALATAN) 0.005 % ophthalmic solution Place 1 drop into both eyes at bedtime.     lidocaine  (LIDODERM ) 5 % Place 1 patch onto the skin daily as needed (for lower back pain- Remove & Discard patch within 12 hours or as directed by MD).     metoprolol  tartrate (LOPRESSOR ) 25 MG tablet Take 12.5 mg by mouth 2 (two) times daily.     pantoprazole  (PROTONIX ) 40 MG tablet Take 40 mg by mouth daily before breakfast.     polyethylene glycol (MIRALAX  / GLYCOLAX ) packet Take 17 g by mouth daily.     QUEtiapine  (SEROQUEL ) 100 MG tablet Take 100 mg by mouth at bedtime.     rosuvastatin  (CRESTOR ) 20 MG tablet Take 20 mg by mouth daily.     senna (SENOKOT) 8.6 MG TABS tablet Take 8.6 mg by mouth at bedtime.     Suvorexant (BELSOMRA) 20 MG TABS Take 20 mg by mouth at bedtime.     temazepam (RESTORIL) 15 MG capsule Take 15 mg by mouth at bedtime.     valbenazine  (INGREZZA ) 80 MG capsule Take 80 mg by mouth daily. Movements in the face     No current facility-administered medications for this visit.    Allergies as of 08/02/2024 - Review Complete 08/02/2024  Allergen Reaction Noted   Flexeril [cyclobenzaprine] Other (See Comments) 09/22/2017   Amitriptyline Other (See Comments)  09/22/2017   Penicillins Rash 09/22/2017    Family History  Problem Relation Age of Onset   Diabetes Mother    Prostate cancer Father    High blood pressure Sister    Colon cancer Neg Hx    Colon polyps Neg Hx    Rectal cancer Neg Hx    Stomach cancer Neg Hx     Social History   Tobacco Use   Smoking status: Never   Smokeless tobacco: Never  Vaping Use   Vaping status: Never Used  Substance Use Topics   Alcohol  use: No   Drug use: No     Review of Systems:    Constitutional: No unintentional weight loss, fever, chills Cardiovascular: No chest pain Respiratory: No SOB  Gastrointestinal: See HPI and otherwise negative Hematologic: No bleeding     Physical Exam:  Vital signs: BP 126/70   Pulse 62   Ht 5' 4 (1.626 m)   Wt 240 lb (108.9 kg)   BMI 41.20 kg/m   Wt Readings from Last 3 Encounters:  08/02/24 240 lb (108.9 kg)  04/20/24 230 lb (104.3 kg)  03/17/24 230 lb (104.3 kg)    Constitutional: Pleasant, obese female in NAD, alert and cooperative Head:  Normocephalic and atraumatic.  Eyes: No scleral icterus. Respiratory: Respirations even and unlabored. Lungs clear to auscultation bilaterally.  No wheezes, crackles, or rhonchi.  Cardiovascular:  Regular rate and rhythm. No murmurs. No peripheral edema. Gastrointestinal:  Soft, nondistended, nontender. No rebound or guarding. Normal bowel sounds. No appreciable masses or hepatomegaly. Rectal: No abnormalities on external exam.  On anoscopy patient has nonbleeding internal hemorrhoids.  No masses seen.  She does have a mucus like discharge seen in the rectum.  No visible bleeding. Chaperone present for exam.  Anoscopy was also repeated by Ellouise Console, PA-C who confirms abnormal discharge seen in rectum. Neurologic:  Alert and oriented x4;  grossly normal neurologically.  Skin:   Dry and intact without significant lesions or rashes. Psychiatric: Oriented to person, place and time. Demonstrates good judgement and  reason without abnormal affect or behaviors.   RELEVANT LABS AND IMAGING: CBC    Component Value Date/Time   WBC 3.9 (L) 01/28/2024 1405   RBC 3.89 01/28/2024 1405   HGB 11.9 (L) 01/28/2024 1405   HCT 37.5 01/28/2024 1405   PLT 270 01/28/2024 1405   MCV 96.4 01/28/2024 1405   MCH 30.6 01/28/2024 1405   MCHC 31.7 01/28/2024 1405   RDW 14.1 01/28/2024 1405   LYMPHSABS 1.6 04/03/2023 1733   MONOABS 0.7 04/03/2023 1733   EOSABS 0.0 04/03/2023 1733   BASOSABS 0.0 04/03/2023 1733    CMP     Component Value Date/Time   NA 141 01/28/2024 1405   K 4.3 01/28/2024 1405   CL 103 01/28/2024 1405   CO2 30 01/28/2024 1405   GLUCOSE 85 01/28/2024 1405   BUN 13 01/28/2024 1405   CREATININE 0.96 01/28/2024 1405   CALCIUM  9.4 01/28/2024 1405   PROT 7.8 11/06/2022 2106   ALBUMIN 3.8 11/06/2022 2106   AST 17 11/06/2022 2106   ALT 12 11/06/2022 2106   ALKPHOS 60 11/06/2022 2106   BILITOT 0.2 (L) 11/06/2022 2106   GFRNONAA >60 01/28/2024 1405   GFRAA >60 01/23/2020 2205   Echocardiogram 10/06/2023 1. Left ventricular ejection fraction, by estimation, is 60 to 65% . The left ventricle has normal function. The left ventricle has no regional wall motion abnormalities. Left ventricular diastolic parameters are consistent with Grade I diastolic dysfunction ( impaired relaxation) .  2. Right ventricular systolic function is normal. The right ventricular size is normal.  3. The mitral valve is normal in structure. Mild mitral valve regurgitation. No evidence of mitral stenosis.  4. The aortic valve is normal in structure. Aortic valve regurgitation is not visualized. No aortic stenosis is present.  5. The inferior vena cava is normal in size with greater than 50% respiratory variability, suggesting right atrial pressure of 3 mmHg.  Assessment/Plan:   Lower abdominal pain Rectal pain Rectal discharge Constipation Patient seen today for  evaluation of 6 months of bilateral lower abdominal pain  as well as 5 months of rectal pain.  She reports that both abdominal pain and rectal pain are constant and achy in nature.  States her abdominal pain is 8.5/10, however she does not appear to be in any acute pain at this time and is nontender to palpation of her lower abdomen.  She questions whether this may be related to her chronic low back pain/sciatica. Rectal exam and anoscopy performed during visit today. No abnormalities on external exam.  On anoscopy patient has nonbleeding internal hemorrhoids.  No masses seen.  She does have a mucus-like discharge seen in the rectum.  No visible bleeding. Anoscopy was also repeated by Ellouise Console, PA-C who confirms abnormal discharge in rectum. Patient denies any anal intercourse.  States she has not had intercourse in 20 years.  Denies recently inserting anything into her rectum including suppositories.  - Update labs today: CBC, CMP, TSH - Will order CT A/P for further evaluation of abdominal and rectal pain, and to rule out abscess or fistula. - If above workup is unrevealing consider referral to PCP for possible STD testing. - Continue MiraLAX  for constipation - Calmol suppository samples given   Camie Furbish, PA-C [facility] 08/02/2024, 9:35 AM  Patient Care Team: Cloria Annabella CROME, DO as PCP - General (Geriatric Medicine)

## 2024-08-02 NOTE — Patient Instructions (Signed)
 Please go to the lab in the basement of our building to have lab work done as you leave today. Hit B for basement when you get on the elevator.  When the doors open the lab is on your left.  We will call you with the results. Thank you.  We have given you samples of the following medication to take: Calmol 4 suppositories - Use as directed as needed  _____________________________________________________  You have been scheduled for a CT scan of the abdomen and pelvis at [facility], 1st floor Radiology. You are scheduled on Monday, 08-08-24 at 4:00pm. Arrive at 3:45 pm.  If you have any questions regarding your exam or if you need to reschedule, you may call [facility] at [phone] between the hours of 8:00 am and 5:00 pm, Monday-Friday. ______________________________________________________   Thank you for entrusting me with your care and for choosing [facility], Bridget [name], Bridget [name]   _______________________________________________________  If your blood pressure at your visit was 140/90 or greater, please contact your primary care physician to follow up on this.  _______________________________________________________  If you are age 28 or older, your body mass index should be between 23-30. Your Body mass index is 41.2 kg/m. If this is out of the aforementioned range listed, please consider follow up with your Primary Care Provider.  If you are age 12 or younger, your body mass index should be between 19-25. Your Body mass index is 41.2 kg/m. If this is out of the aformentioned range listed, please consider follow up with your Primary Care Provider.   ________________________________________________________  The  [facility] providers would like to encourage you to use MYCHART to communicate with providers for non-urgent requests or questions.  Due to long hold times on the telephone, sending your provider a message by [facility] may be a faster and more  efficient way to get a response.  Please allow 48 business hours for a response.  Please remember that this is for non-urgent requests.  _______________________________________________________  [facility] is using a team-based approach to care.  Your team is made up of your doctor and two to three APPS. Our APPS (Nurse Practitioners and Physician Assistants) work with your physician to ensure care continuity for you. They are fully qualified to address your health concerns and develop a treatment plan. They communicate directly with your gastroenterologist to care for you. Seeing the Advanced Practice Practitioners on your physician's team can help you by facilitating care more promptly, often allowing for earlier appointments, access to diagnostic testing, procedures, and other specialty referrals.

## 2024-08-03 ENCOUNTER — Ambulatory Visit: Payer: Self-pay | Admitting: Gastroenterology

## 2024-08-03 NOTE — Progress Notes (Signed)
 ____________________________________________________________  Attending physician addendum:  Thank you for sending this case to me. I have reviewed the entire note and agree with the plan.  Appreciate you being thorough.  Constipation possibly contributing, hemorrhoids are not causing this rectal discomfort.  I agree must question back and other musculoskeletal pain contributing to this.  Victory Brand, MD  ____________________________________________________________

## 2024-08-04 ENCOUNTER — Ambulatory Visit
Admission: RE | Admit: 2024-08-04 | Discharge: 2024-08-04 | Disposition: A | Discharge status: Home / Self Care | Payer: Medicare (Managed Care) | Source: Ambulatory Visit | Attending: Internal Medicine | Admitting: Internal Medicine

## 2024-08-04 DIAGNOSIS — Z1231 Encounter for screening mammogram for malignant neoplasm of breast: Secondary | ICD-10-CM

## 2024-08-08 ENCOUNTER — Ambulatory Visit (HOSPITAL_COMMUNITY)
Admission: RE | Admit: 2024-08-08 | Discharge: 2024-08-08 | Disposition: A | Discharge status: Home / Self Care | Payer: Medicare (Managed Care) | Source: Ambulatory Visit | Attending: Gastroenterology | Admitting: Gastroenterology

## 2024-08-08 DIAGNOSIS — K6289 Other specified diseases of anus and rectum: Secondary | ICD-10-CM | POA: Diagnosis present

## 2024-08-08 DIAGNOSIS — R1030 Lower abdominal pain, unspecified: Secondary | ICD-10-CM | POA: Diagnosis present

## 2024-08-08 MED ORDER — IOHEXOL 300 MG/ML  SOLN
100.0000 mL | Freq: Once | INTRAMUSCULAR | Status: AC | PRN
  Administered 2024-08-08: 100 mL via INTRAVENOUS

## 2024-08-17 ENCOUNTER — Other Ambulatory Visit: Payer: Self-pay

## 2024-08-17 DIAGNOSIS — D649 Anemia, unspecified: Secondary | ICD-10-CM

## 2024-08-17 NOTE — Telephone Encounter (Signed)
 Orders for repeat CBC placed. Pt notified that she needs to come have repeat blood work done. Directions and hours for lab given. Pt verbalized understanding.

## 2024-08-19 ENCOUNTER — Other Ambulatory Visit: Payer: Self-pay | Admitting: Pulmonary Disease

## 2024-08-19 ENCOUNTER — Ambulatory Visit: Payer: Medicare (Managed Care) | Admitting: Pulmonary Disease

## 2024-08-19 VITALS — BP 130/69 | HR 76 | Ht 64.0 in | Wt 242.0 lb

## 2024-08-19 DIAGNOSIS — G4733 Obstructive sleep apnea (adult) (pediatric): Secondary | ICD-10-CM

## 2024-08-19 MED ORDER — ESZOPICLONE 2 MG PO TABS: 2.0000 mg | ORAL_TABLET | Freq: Every evening | ORAL | 1 refills | Status: AC | PRN

## 2024-08-19 MED ORDER — ESZOPICLONE 2 MG PO TABS: 2.0000 mg | ORAL_TABLET | Freq: Every evening | ORAL | 1 refills | Status: DC | PRN

## 2024-08-19 NOTE — Progress Notes (Signed)
 Bridget [name]    [id number]    01/11/1956  Primary Care Physician:Reed, Annabella CROME, DO  Referring Physician: Cloria Annabella CROME, DO [address] [place],  [place] [address]  Chief complaint:   Patient being seen today for chronic insomnia.    HPI: No changes in health since last time she was here  Quviviq  is no longer helping During the last visit prescription for doxepin  was written-stated did not help  Failed Ambien previously, had also been on Restoril-did not remember whether Restoril helped Continues to have difficulty falling asleep, difficulty staying asleep  I did ask her about sleep overall Stated she had a sleep study previously over 10 to 15 years ago does not remember what it showed but felt she may have had some sleep problems, does not recollect ever being started on CPAP patient also stated that she was diagnosed with narcolepsy in the past  Still tries to go to bed about 9 PM, takes a few hours to fall asleep and sometimes without medication may not be able to fall asleep at home Gets out of bed around 10 AM  Does not wake up feeling rested  Continues to feel tired during the day  Previously noted-she did bring again a 2-week sleep diary that reflects she is only been sleeping about 2 to 3 hours of noncontinuous sleep  Continues to struggle with musculoskeletal pain in her back, neck, knees  Brother with sleep issues as well  Not sleepy during the day No dryness of the mouth in the mornings  Definitely past history of snoring  Has tried multiple medications in the past that has not helped with sleep and Did use trazodone  in the past-did not help  She does have a history of schizoaffective disorder-bipolar type, generalized anxiety disorder  Outpatient Encounter Medications as of 08/19/2024  Medication Sig   acetaminophen  (TYLENOL ) 500 MG tablet Take 500 mg by mouth every 6 (six) hours as needed.   ARIPiprazole  (ABILIFY ) 20 MG tablet Take 20  mg by mouth at bedtime.   Calcium  Carb-Cholecalciferol (CALCIUM  600+D3) 600-10 MG-MCG TABS Take 1 tablet by mouth in the morning and at bedtime.   CEQUA 0.09 % SOLN Place 1 drop into both eyes every 12 (twelve) hours.   DULoxetine  (CYMBALTA ) 60 MG capsule Take 120 mg by mouth in the morning.   gabapentin (NEURONTIN) 100 MG capsule Take 100-200 mg by mouth at bedtime.   latanoprost (XALATAN) 0.005 % ophthalmic solution Place 1 drop into both eyes at bedtime.   lidocaine  (LIDODERM ) 5 % Place 1 patch onto the skin daily as needed (for lower back pain- Remove & Discard patch within 12 hours or as directed by MD).   metoprolol  tartrate (LOPRESSOR ) 25 MG tablet Take 12.5 mg by mouth 2 (two) times daily.   pantoprazole  (PROTONIX ) 40 MG tablet Take 40 mg by mouth daily before breakfast.   polyethylene glycol (MIRALAX  / GLYCOLAX ) packet Take 17 g by mouth daily.   QUEtiapine  (SEROQUEL ) 100 MG tablet Take 100 mg by mouth at bedtime.   Rectal Protectant-Emollient (CALMOL-4) 76-10 % SUPP Use as directed   rosuvastatin  (CRESTOR ) 20 MG tablet Take 20 mg by mouth daily.   senna (SENOKOT) 8.6 MG TABS tablet Take 8.6 mg by mouth at bedtime.   temazepam (RESTORIL) 15 MG capsule Take 15 mg by mouth at bedtime.   valbenazine  (INGREZZA ) 80 MG capsule Take 80 mg by mouth daily. Movements in the face  Suvorexant (BELSOMRA) 20 MG TABS Take 20 mg by mouth at bedtime. (Patient not taking: Reported on 08/19/2024)   No facility-administered encounter medications on file as of 08/19/2024.    Allergies as of 08/19/2024 - Review Complete 08/19/2024  Allergen Reaction Noted   Flexeril [cyclobenzaprine] Other (See Comments) 09/22/2017   Amitriptyline Other (See Comments) 09/22/2017   Penicillins Rash 09/22/2017    Past Medical History:  Diagnosis Date   Acute sinusitis 02/06/2016   Age-related nuclear cataract of both eyes 01/26/2017   Allergic rhinitis due to pollen 02/07/2016   Anxiety    Auditory hallucinations  07/08/2016   Back pain    low back - arthritis   Barrett's esophagus    Bilateral presbyopia 01/26/2017   Brachial neuritis or radiculitis 02/06/2016   Bursitis of left hip 02/06/2016   Cervical neuritis 02/06/2016   Chronic bilateral low back pain without sciatica 06/25/2023   Chronic headaches    Complete tear of right rotator cuff 10/30/2021   Constipation    miralax  daily   Corneal dystrophy, anterior 01/26/2017   Cortical senile cataract of both eyes 01/26/2017   DDD (degenerative disc disease), cervical 02/07/2016   DDD (degenerative disc disease), lumbosacral 01/03/2014   Depression    Dermatitis, eczematoid 02/06/2016   Disorder of bursae and tendons in shoulder region 02/06/2016   Dry mouth    biotene   Dysphagia 11/02/2022   Enthesopathy of ankle and tarsus 02/06/2016   Fainting 02/06/2016   Fibromyalgia    GAD (generalized anxiety disorder) 01/24/2020   Generalized osteoarthrosis 09/15/2013   GERD (gastroesophageal reflux disease)    Glossitis 02/07/2016   Heart murmur    Hyperlipemia    Hypertension    Insomnia    Joint pain, knee 02/07/2016   Menopausal disorder 02/07/2016   Migraine headache 02/07/2016   Morbid obesity (HCC) 02/07/2016   Myalgia 02/07/2016   Myoclonus    patient states no longer an issue   Myositis 02/07/2016   Obesity 02/07/2016   Osteoarthritis    lower back, shoulders, hands   Periodic limb movement disorder 02/07/2016   Plantar fasciitis 02/07/2016   Primary osteoarthritis of left knee 01/28/2022   Primary osteoarthritis of right knee 01/28/2022   Referred otalgia 02/07/2016   Restless leg syndrome 02/07/2016   Schizoaffective disorder (HCC)    Schizoaffective disorder, bipolar type (HCC) 02/04/2018   Shoulder pain 02/07/2016   SVD (spontaneous vaginal delivery)    x 3   Toxic effect of secondhand tobacco smoke 02/07/2016   Vaginal atrophy 02/07/2016   Vertigo    Visual hallucination 07/08/2016   Vulvar atrophy  02/07/2016   Wears dentures    full    Past Surgical History:  Procedure Laterality Date   ABDOMINAL HYSTERECTOMY     BREAST EXCISIONAL BIOPSY Right    BREAST EXCISIONAL BIOPSY Right    BREAST EXCISIONAL BIOPSY Right    BREAST EXCISIONAL BIOPSY Right    BUNIONECTOMY Bilateral    COLONOSCOPY  08/2013   HAND SURGERY Right    carpel tunnel surgery   INJECTION KNEE Bilateral 10/30/2021   Procedure: BILATERAL KNEE INJECTIONS;  Surgeon: Jerri Kay HERO, MD;  Location: [facility];  Service: Orthopedics;  Laterality: Bilateral;   MULTIPLE TOOTH EXTRACTIONS     full dentures   SHOULDER ARTHROSCOPY WITH ROTATOR CUFF REPAIR AND SUBACROMIAL DECOMPRESSION Right 10/30/2021   Procedure: RIGHT SHOULDER ARTHROSCOPY WITH ROTATOR CUFF REPAIR AND SUBACROMIAL DECOMPRESSION, DEBRIDEMENT;  Surgeon: Jerri Kay HERO, MD;  Location: [facility];  Service: Orthopedics;  Laterality: Right;   TOTAL KNEE ARTHROPLASTY Left 02/08/2024   Procedure: LEFT TOTAL KNEE REPLACEMENT;  Surgeon: Jerri Kay HERO, MD;  Location: [facility];  Service: Orthopedics;  Laterality: Left;   TRIGGER FINGER RELEASE Bilateral    thumbs    Family History  Problem Relation Age of Onset   Diabetes Mother    Prostate cancer Father    High blood pressure Sister    Colon cancer Neg Hx    Colon polyps Neg Hx    Rectal cancer Neg Hx    Stomach cancer Neg Hx     Social History   Socioeconomic History   Marital status: Divorced    Spouse name: Not on file   Number of children: 7   Years of education: Not on file   Highest education level: Not on file  Occupational History   Not on file  Tobacco Use   Smoking status: Never   Smokeless tobacco: Never  Vaping Use   Vaping status: Never Used  Substance and Sexual Activity   Alcohol use: No   Drug use: No   Sexual activity: Not Currently    Birth control/protection: Post-menopausal    Comment: Hysterectomy  Other Topics Concern   Not on file  Social  History Narrative   Not on file   Social Drivers of Health   Financial Resource Strain: Not on file  Food Insecurity: No Food Insecurity (02/08/2024)   Hunger Vital Sign    Worried About Running Out of Food in the Last Year: Never true    Ran Out of Food in the Last Year: Never true  Transportation Needs: No Transportation Needs (02/08/2024)   PRAPARE - Transportation    Lack of Transportation (Medical): No    Lack of Transportation (Non-Medical): No  Physical Activity: Not on file  Stress: Not on file  Social Connections: Moderately Integrated (02/08/2024)   Social Connection and Isolation Panel    Frequency of Communication with Friends and Family: More than three times a week    Frequency of Social Gatherings with Friends and Family: More than three times a week    Attends Religious Services: More than 4 times per year    Active Member of [employer] or Organizations: Yes    Attends Banker Meetings: More than 4 times per year    Marital Status: Divorced  Intimate Partner Violence: Not At Risk (02/08/2024)   Humiliation, Afraid, Rape, and Kick questionnaire    Fear of Current or Ex-Partner: No    Emotionally Abused: No    Physically Abused: No    Sexually Abused: No    Review of Systems  Psychiatric/Behavioral:  Positive for sleep disturbance.   All other systems reviewed and are negative.   Vitals:   08/19/24 0910  BP: 130/69  Pulse: 76  SpO2: 99%     Physical Exam Constitutional:      Appearance: She is obese.  HENT:     Head: Normocephalic and atraumatic.     Mouth/Throat:     Mouth: Mucous membranes are moist.     Comments: Mallampati 3, crowded oropharynx Eyes:     Extraocular Movements: Extraocular movements intact.     Pupils: Pupils are equal, round, and reactive to light.  Cardiovascular:     Rate and Rhythm: Normal rate and regular rhythm.     Heart sounds: No murmur heard.    No friction rub.  Pulmonary:  Effort: No respiratory  distress.     Breath sounds: No stridor. No wheezing or rhonchi.  Musculoskeletal:     Cervical back: No rigidity or tenderness.  Neurological:     Mental Status: She is alert.  Psychiatric:        Mood and Affect: Mood normal.       08/19/2024    9:00 AM  Results of the Epworth flowsheet  Sitting and reading 0  Watching TV 0  Sitting, inactive in a public place (e.g. a theatre or a meeting) 0  As a passenger in a car for an hour without a break 0  Lying down to rest in the afternoon when circumstances permit 0  Sitting and talking to someone 0  Sitting quietly after a lunch without alcohol 0  In a car, while stopped for a few minutes in traffic 0  Total score 0     Assessment:  Chronic insomnia - Longstanding history - Has failed multiple medications in the past - Does not recollect that she tried Lunesta  previously - Was managed with Quviviq  but feels this is no longer helping at all  Insomnia due to medical condition Bipolar disorder  Class III obesity  Ongoing concern for sleep disordered breathing  Medication side effect profile reviewed  Plan/Recommendations: Will discontinue Quviviq   Prescription for Lunesta  2 mg will be sent to pharmacy If 2 mg of Lunesta  is not effective, may Try 3 mg  Doxepin  will be discontinued as well  Graded activities as tolerated  Follow-up in about 8 weeks  Encouraged to call with significant concerns   Jennet Epley MD [facility] 08/19/2024, 9:32 AM  CC: Cloria Annabella CROME, DO

## 2024-08-19 NOTE — Patient Instructions (Signed)
 We will try Lunesta  2 mg at night  If not effective, we can go up to the 3 mg  I will schedule you for a sleep study  Schedule for in-lab sleep study  Continue your other medications

## 2024-09-01 ENCOUNTER — Other Ambulatory Visit (INDEPENDENT_AMBULATORY_CARE_PROVIDER_SITE_OTHER)

## 2024-09-01 ENCOUNTER — Ambulatory Visit: Payer: Self-pay | Admitting: Gastroenterology

## 2024-09-01 DIAGNOSIS — D649 Anemia, unspecified: Secondary | ICD-10-CM

## 2024-09-01 LAB — CBC WITH DIFFERENTIAL/PLATELET
Basophils Absolute: 0 K/uL (ref 0.0–0.1)
Basophils Relative: 0.9 % (ref 0.0–3.0)
Eosinophils Absolute: 0.1 K/uL (ref 0.0–0.7)
Eosinophils Relative: 2.5 % (ref 0.0–5.0)
HCT: 35.4 % — ABNORMAL LOW (ref 36.0–46.0)
Hemoglobin: 11.4 g/dL — ABNORMAL LOW (ref 12.0–15.0)
Lymphocytes Relative: 36.9 % (ref 12.0–46.0)
Lymphs Abs: 1.5 K/uL (ref 0.7–4.0)
MCHC: 32.3 g/dL (ref 30.0–36.0)
MCV: 92.7 fl (ref 78.0–100.0)
Monocytes Absolute: 0.4 K/uL (ref 0.1–1.0)
Monocytes Relative: 9.5 % (ref 3.0–12.0)
Neutro Abs: 2 K/uL (ref 1.4–7.7)
Neutrophils Relative %: 50.2 % (ref 43.0–77.0)
Platelets: 264 K/uL (ref 150.0–400.0)
RBC: 3.82 Mil/uL — ABNORMAL LOW (ref 3.87–5.11)
RDW: 14.8 % (ref 11.5–15.5)
WBC: 4.1 K/uL (ref 4.0–10.5)

## 2024-09-13 ENCOUNTER — Ambulatory Visit: Payer: Medicare (Managed Care) | Admitting: Cardiology

## 2024-10-17 ENCOUNTER — Ambulatory Visit (HOSPITAL_BASED_OUTPATIENT_CLINIC_OR_DEPARTMENT_OTHER): Payer: Medicare (Managed Care) | Admitting: Pulmonary Disease

## 2024-10-21 ENCOUNTER — Ambulatory Visit (HOSPITAL_BASED_OUTPATIENT_CLINIC_OR_DEPARTMENT_OTHER): Payer: Medicare (Managed Care) | Attending: Pulmonary Disease | Admitting: Pulmonary Disease

## 2024-10-21 DIAGNOSIS — G4700 Insomnia, unspecified: Secondary | ICD-10-CM | POA: Diagnosis not present

## 2024-10-21 DIAGNOSIS — R0683 Snoring: Secondary | ICD-10-CM | POA: Diagnosis present

## 2024-10-21 DIAGNOSIS — G4733 Obstructive sleep apnea (adult) (pediatric): Secondary | ICD-10-CM

## 2024-10-27 ENCOUNTER — Ambulatory Visit: Admitting: Pulmonary Disease

## 2024-11-03 ENCOUNTER — Ambulatory Visit: Payer: Medicare (Managed Care) | Attending: Nurse Practitioner | Admitting: Nurse Practitioner

## 2024-11-03 VITALS — BP 169/85 | HR 65 | Temp 97.5°F | Resp 18 | Ht 64.0 in | Wt 240.0 lb

## 2024-11-03 DIAGNOSIS — G8929 Other chronic pain: Secondary | ICD-10-CM | POA: Insufficient documentation

## 2024-11-03 DIAGNOSIS — G894 Chronic pain syndrome: Secondary | ICD-10-CM | POA: Insufficient documentation

## 2024-11-03 DIAGNOSIS — M47816 Spondylosis without myelopathy or radiculopathy, lumbar region: Secondary | ICD-10-CM | POA: Insufficient documentation

## 2024-11-03 DIAGNOSIS — M5416 Radiculopathy, lumbar region: Secondary | ICD-10-CM | POA: Insufficient documentation

## 2024-11-03 DIAGNOSIS — M48062 Spinal stenosis, lumbar region with neurogenic claudication: Secondary | ICD-10-CM | POA: Insufficient documentation

## 2024-11-03 DIAGNOSIS — I10 Essential (primary) hypertension: Secondary | ICD-10-CM | POA: Insufficient documentation

## 2024-11-03 MED ORDER — METHOCARBAMOL 1000 MG/10ML IJ SOLN
200.0000 mg | Freq: Once | INTRAMUSCULAR | Status: AC
  Administered 2024-11-03: 200 mg via INTRAMUSCULAR
  Filled 2024-11-03: qty 10

## 2024-11-03 MED ORDER — KETOROLAC TROMETHAMINE 30 MG/ML IJ SOLN
30.0000 mg | Freq: Once | INTRAMUSCULAR | Status: AC
  Administered 2024-11-03: 30 mg via INTRAMUSCULAR
  Filled 2024-11-03: qty 1

## 2024-11-03 NOTE — Progress Notes (Signed)
 PROVIDER NOTE: Interpretation of information contained herein should be left to medically-trained personnel. Specific patient instructions are provided elsewhere under Patient Instructions section of medical record. This document was created in part using AI and STT-dictation technology, any transcriptional errors that may result from this process are unintentional.  Patient: Bridget [name]  Service: E/M   PCP: Cloria Annabella CROME, DO  DOB: 1956/07/18  DOS: 11/03/2024  Provider: Emmy MARLA Blanch, NP  MRN: [id number]  Delivery: Face-to-face  Specialty: Interventional Pain Management  Type: Established Patient  Setting: Ambulatory outpatient facility  Specialty designation: 09  Referring Prov.: Cloria Annabella CROME, DO  Location: Outpatient office facility       History of present illness (HPI) Bridget [name], a 68 y.o. year old female, is here today because of her Chronic radicular lumbar pain [M54.16, G89.29]. Bridget [name]'s primary complain today is Back Pain, Hip Pain, Leg Pain, Groin Pain, and Pelvic Pain  Pertinent problems: Bridget [name] has Schizoaffective disorder, bipolar type (HCC); GAD (generalized anxiety disorder); Complete tear of right rotator cuff; Primary osteoarthritis of right knee; Primary osteoarthritis of left knee; Chronic bilateral low back pain without sciatica; Back pain; Barrett's esophagus; Chronic headaches; Fibromyalgia; GERD (gastroesophageal reflux disease); Insomnia; Osteoarthritis; Brachial neuritis or radiculitis; Cervical neuritis; Plantar fasciitis; Bursitis of left hip; DDD (degenerative disc disease), cervical; DDD (degenerative disc disease), lumbosacral; Disorder of bursae and tendons in shoulder region; Morbid obesity (HCC); Periodic limb movement disorder; Restless leg syndrome; Shoulder pain; Type II diabetes mellitus (HCC); Auditory hallucinations; Visual hallucination; and Status post total left knee replacement on their pertinent problem list.  Pain  Assessment: Severity of Chronic pain is reported as a 9 /10. Location: Back Lower/Radiates around to right hips and down right leg. Onset: More than a month ago. Quality: Aching, Throbbing. Timing: Constant. Modifying factor(s): Denies. Vitals:  height is 5' 4 (1.626 m) and weight is 240 lb (108.9 kg). Her temporal temperature is 97.5 F (36.4 C) (abnormal). Her blood pressure is 169/85 (abnormal) and her pulse is 65. Her respiration is 18 and oxygen saturation is 98%.  BMI: Estimated body mass index is 41.2 kg/m as calculated from the following:   Height as of this encounter: 5' 4 (1.626 m).   Weight as of this encounter: 240 lb (108.9 kg).  Last encounter: Visit date not found. Last procedure: Visit date not found.  Reason for encounter: evaluation for possible interventional PM therapy/treatment.   Discussed the use of AI scribe software for clinical note transcription with the patient, who gave verbal consent to proceed.  History of Present Illness   Bridget [name] is a 68 year old female with chronic low back pain who presents for follow-up after lumbar transforaminal epidural injection after 6-months.   She experiences low back pain radiating to her right hip and down her leg to her toe, affecting only the right side. A lumbar epidural injection in May provided relief for about four to five days before the pain returned. She completed physical therapy in [place] and is currently taking Mobic  for pain, which is not effective for her current pain. She had knee surgery in February and uses a mobility aid due to her pain.   She reports new onset of left groin pain, which began approximately five weeks ago after stepping in and out of a high van. The pain is excruciating and localized to the groin area.  She takes her medications as provided in a pack and has a history of sugar  issues.     Pharmacotherapy Assessment   Monitoring: [place] PMP: PDMP reviewed during this encounter.        Pharmacotherapy: No side-effects or adverse reactions reported. Compliance: No problems identified. Effectiveness: Clinically acceptable.  Erlene Doyal SAUNDERS, [place]  11/03/2024 11:36 AM  Sign when Signing Visit Safety precautions to be maintained throughout the outpatient stay will include: orient to surroundings, keep bed in low position, maintain call bell within reach at all times, provide assistance with transfer out of bed and ambulation.     UDS:  No results found for: SUMMARY  No results found for: CBDTHCR No results found for: D8THCCBX No results found for: D9THCCBX  ROS  Constitutional: Denies any fever or chills Gastrointestinal: No reported hemesis, hematochezia, vomiting, or acute GI distress Musculoskeletal: Denies any acute onset joint swelling, redness, loss of ROM, or weakness Neurological: No reported episodes of acute onset apraxia, aphasia, dysarthria, agnosia, amnesia, paralysis, loss of coordination, or loss of consciousness  Medication Review  Calcium  Carb-Cholecalciferol, Calmol-4, DULoxetine , QUEtiapine , acetaminophen , cycloSPORINE (PF), eszopiclone , gabapentin, latanoprost, lidocaine , meloxicam , metoprolol  tartrate, pantoprazole , polyethylene glycol, rosuvastatin , and senna  History Review  Allergy: Bridget [name] is allergic to flexeril [cyclobenzaprine], amitriptyline, and penicillins. Drug: Bridget [name]  reports no history of drug use. Alcohol:  reports no history of alcohol use. Tobacco:  reports that she has never smoked. She has never used smokeless tobacco. Social: Bridget [name]  reports that she has never smoked. She has never used smokeless tobacco. She reports that she does not drink alcohol and does not use drugs. Medical:  has a past medical history of Acute sinusitis (02/06/2016), Age-related nuclear cataract of both eyes (01/26/2017), Allergic rhinitis due to pollen (02/07/2016), Angina pectoris (09/03/2023), Anxiety, Auditory hallucinations  (07/08/2016), Back pain, Barrett's esophagus, Bilateral presbyopia (01/26/2017), Brachial neuritis or radiculitis (02/06/2016), Bursitis of left hip (02/06/2016), Cardiac murmur (09/03/2023), Cervical neuritis (02/06/2016), Chronic bilateral low back pain without sciatica (06/25/2023), Chronic headaches, Complete tear of right rotator cuff (10/30/2021), Constipation, Corneal dystrophy, 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), Cortical senile cataract of both eyes (01/26/2017), DDD (degenerative disc disease), cervical (02/07/2016), DDD (degenerative disc disease), lumbosacral (01/03/2014), Depression, Dermatitis, eczematoid (02/06/2016), Disorder of bursae and tendons in shoulder region (02/06/2016), Dry mouth, Dysphagia (11/02/2022), Enthesopathy of ankle and tarsus (02/06/2016), Fainting (02/06/2016), Fibromyalgia, GAD (generalized anxiety disorder) (01/24/2020), 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/15/2013), GERD (gastroesophageal reflux disease), Glossitis (02/07/2016), Heart murmur, Hyperlipemia, Hypertension, Insomnia, Joint pain, knee (02/07/2016), Menopausal disorder (02/07/2016), Migraine headache (02/07/2016), Morbid obesity (HCC) (02/07/2016), Myalgia (02/07/2016), Myoclonus, Myositis (02/07/2016), Obesity (02/07/2016), Osteoarthritis, Periodic limb movement disorder (02/07/2016), Plantar fasciitis (02/07/2016), Primary osteoarthritis of left knee (01/28/2022), Primary osteoarthritis of right knee (01/28/2022), Referred otalgia (02/07/2016), Restless leg syndrome (02/07/2016), Schizoaffective disorder (HCC), Schizoaffective disorder, bipolar type (HCC) (02/04/2018), Shoulder pain (02/07/2016), Status post total left knee replacement (02/08/2024), SVD (spontaneous vaginal delivery), Toxic effect of secondhand tobacco smoke (02/07/2016), Type II diabetes mellitus (HCC) (04/07/2014), Vaginal atrophy (02/07/2016), Vertigo, Visual hallucination (07/08/2016), Vulvar atrophy (02/07/2016), and Wears dentures. Surgical: Bridget [name]   has a past surgical history that includes Breast excisional biopsy (Right); Breast excisional biopsy (Right); Breast excisional biopsy (Right); Breast excisional biopsy (Right); Abdominal hysterectomy; Colonoscopy (08/2013); Multiple tooth extractions; Hand surgery (Right); Trigger finger release (Bilateral); Bunionectomy (Bilateral); Shoulder arthroscopy with rotator cuff repair and subacromial decompression (Right, 10/30/2021); Injection knee (Bilateral, 10/30/2021); and Total knee arthroplasty (Left, 02/08/2024). Family: family history includes Diabetes in her mother; High blood pressure in her sister; Prostate cancer in her father.  Laboratory Chemistry Profile   Renal Lab Results  Component Value Date   BUN 11 08/02/2024   CREATININE 0.91 08/02/2024   GFR 64.49 08/02/2024   GFRAA >60 01/23/2020   GFRNONAA >60 01/28/2024    Hepatic Lab Results  Component Value Date   AST 17 11/06/2022   ALT 12 11/06/2022   ALBUMIN 3.8 11/06/2022   ALKPHOS 60 11/06/2022   LIPASE 26 11/06/2022    Electrolytes Lab Results  Component Value Date   NA 141 08/02/2024   K 4.2 08/02/2024   CL 102 08/02/2024   CALCIUM  9.2 08/02/2024    Bone No results found for: VD25OH, VD125OH2TOT, CI6874NY7, CI7874NY7, 25OHVITD1, 25OHVITD2, 25OHVITD3, TESTOFREE, TESTOSTERONE  Inflammation (CRP: Acute Phase) (ESR: Chronic Phase) No results found for: CRP, ESRSEDRATE, LATICACIDVEN       Note: Above Lab results reviewed.  Recent Imaging Review  Sleep Study Documents Ordered by an unspecified provider. Note: Reviewed        Physical Exam  Vitals: BP (!) 169/85 (BP Location: Right Arm, Patient Position: Sitting, Cuff Size: Normal)   Pulse 65   Temp (!) 97.5 F (36.4 C) (Temporal)   Resp 18   Ht 5' 4 (1.626 m)   Wt 240 lb (108.9 kg)   SpO2 98%   BMI 41.20 kg/m  BMI: Estimated body mass index is 41.2 kg/m as calculated from the following:   Height as of this encounter: 5' 4 (1.626  m).   Weight as of this encounter: 240 lb (108.9 kg). Ideal: Ideal body weight: 54.7 kg (120 lb 9.5 oz) Adjusted ideal body weight: 76.4 kg (168 lb 5.7 oz) General appearance: Well nourished, well developed, and well hydrated. In no apparent acute distress Mental status: Alert, oriented x 3 (person, place, & time)       Respiratory: No evidence of acute respiratory distress Eyes: PERLA  Musculoskeletal: +LBP Lumbar Spine Area Exam  Skin & Axial Inspection: No masses, redness, or swelling Alignment: Symmetrical Functional ROM: Pain restricted ROM       Stability: No instability detected Muscle Tone/Strength: Functionally intact. No obvious neuro-muscular anomalies detected. Sensory (Neurological): Dermatomal pain pattern R>L Palpation: No palpable anomalies       Provocative Tests: Hyperextension/rotation test: (+) due to pain. Lumbar quadrant test (Kemp's test): (+) on the right for foraminal stenosis   Gait & Posture Assessment  Ambulation: Limited Gait: Antalgic gait (limping) Posture: Difficulty standing up straight, due to pain  Lower Extremity Exam      Side: Right lower extremity   Side: Left lower extremity  Stability: No instability observed           Stability: No instability observed          Skin & Extremity Inspection: Skin color, temperature, and hair growth are WNL. No peripheral edema or cyanosis. No masses, redness, swelling, asymmetry, or associated skin lesions. No contractures.   Skin & Extremity Inspection: Skin color, temperature, and hair growth are WNL. No peripheral edema or cyanosis. No masses, redness, swelling, asymmetry, or associated skin lesions. No contractures.  Functional ROM: Pain restricted ROM for hip and knee joints           Functional ROM: Unrestricted ROM                  Muscle Tone/Strength: Functionally intact. No obvious neuro-muscular anomalies detected.   Muscle Tone/Strength: Functionally intact. No obvious neuro-muscular anomalies  detected.  Sensory (Neurological): Dermatomal pain pattern         Sensory (Neurological): Unimpaired  DTR: Patellar: deferred today Achilles: deferred today Plantar: deferred today   DTR: Patellar: deferred today Achilles: deferred today Plantar: deferred today  Palpation: No palpable anomalies   Palpation: No palpable anomalies    Assessment   Diagnosis Status  1. Chronic radicular lumbar pain   2. Lumbar radiculopathy   3. Spinal stenosis, lumbar region, with neurogenic claudication   4. Chronic pain syndrome   5. Lumbar facet arthropathy    Having a Flare-up Having a Flare-up Having a Flare-up   Updated Problems: No problems updated.  Plan of Care  Problem-specific:  Assessment and Plan    Right lumbar radiculopathy with lumbar spinal stenosis and spondylosis Chronic right lumbar radiculopathy with L5 nerve involvement. Previous lumbar transforaminal epidural provided temporary relief. Weight management discussed. Lumbar facet nerve block considered if epidural is ineffective. - Scheduled lumbar epidural with Dr. Marcelino - Administered Toradol  and Robaxin  IM injection for pain relief until next appointment. - Ensure follow-up after epidural to assess effectiveness. - Consider lumbar facet nerve block if epidural is ineffective.  Acute left groin pain Acute left groin pain possibly related to recent physical activity. Previous CT scan normal. Pain management discussed with primary care provider. - Administered Toradol  and Robaxin  IM injection for pain relief. - Advised to inform primary care provider for further evaluation.   Plan: (Clinic): (B/L) L-TFESI # 2 with (Sedation) with Dr. Marcelino       Bridget [name] has a current medication list which includes the following long-term medication(s): calcium  carb-cholecalciferol, duloxetine , eszopiclone , and rosuvastatin .  Pharmacotherapy (Medications Ordered): Meds ordered this encounter  Medications    ketorolac  (TORADOL ) 30 MG/ML injection 30 mg   methocarbamol  (ROBAXIN ) injection 200 mg   Orders:  Orders Placed This Encounter  Procedures   Lumbar Transforaminal Epidural    Standing Status:   Future    Expiration Date:   02/03/2025    Scheduling Instructions:     Laterality: Bilateral     Level(s): L4 & L5     Sedation: With Sedation.     Timeframe: 6 weeks from now.    Where will this procedure be performed?:   [facility]        Return in about 6 weeks (around 12/15/2024) for (Clinic): (B/L) L-TFESI # 2 with (Sedation) Dr. Marcelino .    Recent Visits No visits were found meeting these conditions. Showing recent visits within past 90 days and meeting all other requirements Today's Visits Date Type Provider Dept  11/03/24 Office Visit Lakasha Mcfall K, NP [facility]  Showing today's visits and meeting all other requirements Future Appointments No visits were found meeting these conditions. Showing future appointments within next 90 days and meeting all other requirements  I discussed the assessment and treatment plan with the patient. The patient was provided an opportunity to ask questions and all were answered. The patient agreed with the plan and demonstrated an understanding of the instructions.  Patient advised to call back or seek an in-person evaluation if the symptoms or condition worsens.  I personally spent a total of 30 minutes in the care of the patient today including preparing to see the patient, getting/reviewing separately obtained history, performing a medically appropriate exam/evaluation, counseling and educating, placing orders, referring and communicating with other health care professionals, documenting clinical information in the EHR, independently interpreting results, communicating results, and coordinating care.   Note by: Javares Kaufhold K Pam Vanalstine, NP (TTS and AI technology used. I apologize for any typographical errors  that were not detected and  corrected.) Date: 11/03/2024; Time: 1:14 PM

## 2024-11-03 NOTE — Progress Notes (Signed)
 Safety precautions to be maintained throughout the outpatient stay will include: orient to surroundings, keep bed in low position, maintain call bell within reach at all times, provide assistance with transfer out of bed and ambulation.

## 2024-11-09 ENCOUNTER — Encounter: Payer: Self-pay | Admitting: Cardiology

## 2024-11-09 ENCOUNTER — Ambulatory Visit: Payer: Medicare (Managed Care) | Attending: Cardiology | Admitting: Cardiology

## 2024-11-09 VITALS — BP 142/90 | HR 63 | Ht 64.0 in | Wt 244.0 lb

## 2024-11-09 DIAGNOSIS — E785 Hyperlipidemia, unspecified: Secondary | ICD-10-CM | POA: Diagnosis not present

## 2024-11-09 DIAGNOSIS — I10 Essential (primary) hypertension: Secondary | ICD-10-CM | POA: Diagnosis not present

## 2024-11-09 NOTE — Progress Notes (Signed)
 Cardiology Office Note:    Date:  11/09/2024   ID:  Bridget [name], DOB April 14, 1956, MRN [id number]  PCP:  Cloria Annabella CROME, DO  Cardiologist:  Jennifer JONELLE Crape, MD   Referring MD: Cloria Annabella CROME, DO    ASSESSMENT:    1. Hyperlipidemia, unspecified hyperlipidemia type   2. Primary hypertension   3. Morbid obesity (HCC)    PLAN:    In order of problems listed above:  Primary prevention stressed with the patient.  Importance of compliance with diet medication stressed and patient verbalized standing. She was advised to ambulate to the best of her ability. Morbid obesity: Weight reduction stressed diet emphasized and she promises to do better.  Risks of obesity explained. Mixed dyslipidemia: On lipid-lowering medications followed by primary care.  Lipids reviewed with him and discussed. Essential hypertension: Blood pressure stable.  She mentions blood pressure readings from home are fine.  Follow-up blood pressure by me was salt intake issues diet and exercise stressed lifestyle modifications stressed and she promises to do better. Results of CT coronary angiography discussed with her.Patient will be seen in follow-up appointment in 12 months or earlier if the patient has any concerns.    Medication Adjustments/Labs and Tests Ordered: Current medicines are reviewed at length with the patient today.  Concerns regarding medicines are outlined above.  Orders Placed This Encounter  Procedures   EKG 12-Lead   No orders of the defined types were placed in this encounter.    No chief complaint on file.    History of Present Illness:    Bridget [name] is a 68 y.o. female.  Patient has past medical history of essential hypertension, mixed dyslipidemia and morbid obesity.  She ambulates with a walker because she has heavy replacement surgery.  She denies any chest pain orthopnea or PND.  At the time of my evaluation, the patient is alert awake oriented and in no  distress.  Past Medical History:  Diagnosis Date   Acute sinusitis 02/06/2016   Age-related nuclear cataract of both eyes 01/26/2017   Allergic rhinitis due to pollen 02/07/2016   Angina pectoris 09/03/2023   Anxiety    Auditory hallucinations 07/08/2016   Back pain    low back - arthritis   Barrett's esophagus    Bilateral presbyopia 01/26/2017   Brachial neuritis or radiculitis 02/06/2016   Bursitis of left hip 02/06/2016   Cardiac murmur 09/03/2023   Cervical neuritis 02/06/2016   Chronic bilateral low back pain without sciatica 06/25/2023   Chronic headaches    Complete tear of right rotator cuff 10/30/2021   Constipation    miralax  daily   Corneal dystrophy, anterior 01/26/2017   Cortical senile cataract of both eyes 01/26/2017   DDD (degenerative disc disease), cervical 02/07/2016   DDD (degenerative disc disease), lumbosacral 01/03/2014   Depression    Dermatitis, eczematoid 02/06/2016   Disorder of bursae and tendons in shoulder region 02/06/2016   Dry mouth    biotene   Dysphagia 11/02/2022   Enthesopathy of ankle and tarsus 02/06/2016   Fainting 02/06/2016   Fibromyalgia    GAD (generalized anxiety disorder) 01/24/2020   Generalized osteoarthrosis 09/15/2013   GERD (gastroesophageal reflux disease)    Glossitis 02/07/2016   Heart murmur    Hyperlipemia    Hypertension    Insomnia    Joint pain, knee 02/07/2016   Menopausal disorder 02/07/2016   Migraine headache 02/07/2016   Morbid obesity (HCC) 02/07/2016   Myalgia 02/07/2016  Myoclonus    patient states no longer an issue   Myositis 02/07/2016   Obesity 02/07/2016   Osteoarthritis    lower back, shoulders, hands   Periodic limb movement disorder 02/07/2016   Plantar fasciitis 02/07/2016   Primary osteoarthritis of left knee 01/28/2022   Primary osteoarthritis of right knee 01/28/2022   Referred otalgia 02/07/2016   Restless leg syndrome 02/07/2016   Schizoaffective disorder (HCC)     Schizoaffective disorder, bipolar type (HCC) 02/04/2018   Shoulder pain 02/07/2016   Status post total left knee replacement 02/08/2024   SVD (spontaneous vaginal delivery)    x 3   Toxic effect of secondhand tobacco smoke 02/07/2016   Type II diabetes mellitus (HCC) 04/07/2014   Vaginal atrophy 02/07/2016   Vertigo    Visual hallucination 07/08/2016   Vulvar atrophy 02/07/2016   Wears dentures    full    Past Surgical History:  Procedure Laterality Date   ABDOMINAL HYSTERECTOMY     BREAST EXCISIONAL BIOPSY Right    BREAST EXCISIONAL BIOPSY Right    BREAST EXCISIONAL BIOPSY Right    BREAST EXCISIONAL BIOPSY Right    BUNIONECTOMY Bilateral    COLONOSCOPY  08/2013   HAND SURGERY Right    carpel tunnel surgery   INJECTION KNEE Bilateral 10/30/2021   Procedure: BILATERAL KNEE INJECTIONS;  Surgeon: Jerri Kay HERO, MD;  Location: [facility];  Service: Orthopedics;  Laterality: Bilateral;   MULTIPLE TOOTH EXTRACTIONS     full dentures   SHOULDER ARTHROSCOPY WITH ROTATOR CUFF REPAIR AND SUBACROMIAL DECOMPRESSION Right 10/30/2021   Procedure: RIGHT SHOULDER ARTHROSCOPY WITH ROTATOR CUFF REPAIR AND SUBACROMIAL DECOMPRESSION, DEBRIDEMENT;  Surgeon: Jerri Kay HERO, MD;  Location: [facility];  Service: Orthopedics;  Laterality: Right;   TOTAL KNEE ARTHROPLASTY Left 02/08/2024   Procedure: LEFT TOTAL KNEE REPLACEMENT;  Surgeon: Jerri Kay HERO, MD;  Location: [facility];  Service: Orthopedics;  Laterality: Left;   TRIGGER FINGER RELEASE Bilateral    thumbs    Current Medications: Current Meds  Medication Sig   acetaminophen  (TYLENOL ) 500 MG tablet Take 500 mg by mouth every 6 (six) hours as needed.   Calcium  Carb-Cholecalciferol (CALCIUM  600+D3) 600-10 MG-MCG TABS Take 1 tablet by mouth in the morning and at bedtime.   CEQUA 0.09 % SOLN Place 1 drop into both eyes every 12 (twelve) hours.   DULoxetine  (CYMBALTA ) 60 MG capsule Take 120 mg by mouth in the morning.    eszopiclone  (LUNESTA ) 2 MG TABS tablet Take 1 tablet (2 mg total) by mouth at bedtime as needed for sleep. Take immediately before bedtime   gabapentin (NEURONTIN) 100 MG capsule Take 100-200 mg by mouth at bedtime.   latanoprost (XALATAN) 0.005 % ophthalmic solution Place 1 drop into both eyes at bedtime.   lidocaine  (LIDODERM ) 5 % Place 1 patch onto the skin daily as needed (for lower back pain- Remove & Discard patch within 12 hours or as directed by MD).   meloxicam  (MOBIC ) 15 MG tablet Take 15 mg by mouth daily.   metoprolol  tartrate (LOPRESSOR ) 25 MG tablet Take 12.5 mg by mouth 2 (two) times daily.   pantoprazole  (PROTONIX ) 40 MG tablet Take 40 mg by mouth daily before breakfast.   polyethylene glycol (MIRALAX  / GLYCOLAX ) packet Take 17 g by mouth daily.   QUEtiapine  (SEROQUEL ) 100 MG tablet Take 100 mg by mouth at bedtime.   Rectal Protectant-Emollient (CALMOL-4) 76-10 % SUPP Use as directed   rosuvastatin  (CRESTOR ) 20 MG tablet  Take 20 mg by mouth daily.   senna (SENOKOT) 8.6 MG TABS tablet Take 8.6 mg by mouth at bedtime.     Allergies:   Flexeril [cyclobenzaprine], Amitriptyline, and Penicillins   Social History   Socioeconomic History   Marital status: Divorced    Spouse name: Not on file   Number of children: 7   Years of education: Not on file   Highest education level: Not on file  Occupational History   Not on file  Tobacco Use   Smoking status: Never   Smokeless tobacco: Never  Vaping Use   Vaping status: Never Used  Substance and Sexual Activity   Alcohol use: No   Drug use: No   Sexual activity: Not Currently    Birth control/protection: Post-menopausal    Comment: Hysterectomy  Other Topics Concern   Not on file  Social History Narrative   Not on file   Social Drivers of Health   Financial Resource Strain: Not on file  Food Insecurity: No Food Insecurity (02/08/2024)   Hunger Vital Sign    Worried About Running Out of Food in the Last Year: Never true     Ran Out of Food in the Last Year: Never true  Transportation Needs: No Transportation Needs (02/08/2024)   PRAPARE - Transportation    Lack of Transportation (Medical): No    Lack of Transportation (Non-Medical): No  Physical Activity: Not on file  Stress: Not on file  Social Connections: Moderately Integrated (02/08/2024)   Social Connection and Isolation Panel    Frequency of Communication with Friends and Family: More than three times a week    Frequency of Social Gatherings with Friends and Family: More than three times a week    Attends Religious Services: More than 4 times per year    Active Member of [employer] or Organizations: Yes    Attends Engineer, Structural: More than 4 times per year    Marital Status: Divorced     Family History: The patient's family history includes Diabetes in her mother; High blood pressure in her sister; Prostate cancer in her father. There is no history of Colon cancer, Colon polyps, Rectal cancer, or Stomach cancer.  ROS:   Please see the history of present illness.    All other systems reviewed and are negative.  EKGs/Labs/Other Studies Reviewed:    The following studies were reviewed today: .SABRAEKG Interpretation Date/Time:  Wednesday November 09 2024 10:19:09 EST Ventricular Rate:  63 PR Interval:  170 QRS Duration:  84 QT Interval:  370 QTC Calculation: 378 R Axis:   5  Text Interpretation: Normal sinus rhythm Minimal voltage criteria for LVH, may be normal variant ( R in aVL ) Septal infarct (cited on or before 03-Sep-2023) When compared with ECG of 03-Sep-2023 13:25, T wave inversion now evident in Inferior leads Nonspecific T wave abnormality now evident in Anterolateral leads Confirmed by Edwyna Backers [phone]) on 11/09/2024 10:41:45 AM     Recent Labs: 08/02/2024: BUN 11; Creatinine, Ser 0.91; Potassium 4.2; Sodium 141; TSH 2.40 09/01/2024: Hemoglobin 11.4; Platelets 264.0  Recent Lipid Panel No results found for: CHOL,  TRIG, HDL, CHOLHDL, VLDL, LDLCALC, LDLDIRECT  Physical Exam:    VS:  BP (!) 142/90   Pulse 63   Ht 5' 4 (1.626 m)   Wt 244 lb 0.6 oz (110.7 kg)   SpO2 97%   BMI 41.89 kg/m     Wt Readings from Last 3 Encounters:  11/09/24 244 lb 0.6  oz (110.7 kg)  11/03/24 240 lb (108.9 kg)  10/21/24 243 lb (110.2 kg)     GEN: Patient is in no acute distress HEENT: Normal NECK: No JVD; No carotid bruits LYMPHATICS: No lymphadenopathy CARDIAC: Hear sounds regular, 2/6 systolic murmur at the apex. RESPIRATORY:  Clear to auscultation without rales, wheezing or rhonchi  ABDOMEN: Soft, non-tender, non-distended MUSCULOSKELETAL:  No edema; No deformity  SKIN: Warm and dry NEUROLOGIC:  Alert and oriented x 3 PSYCHIATRIC:  Normal affect   Signed, Jennifer JONELLE Crape, MD  11/09/2024 10:47 AM    [facility]

## 2024-11-09 NOTE — Patient Instructions (Signed)

## 2024-11-10 ENCOUNTER — Telehealth: Payer: Self-pay | Admitting: Pulmonary Disease

## 2024-11-10 DIAGNOSIS — R0683 Snoring: Secondary | ICD-10-CM

## 2024-11-10 NOTE — Telephone Encounter (Signed)
 Call patient  Sleep study result  Date of study: 10/21/2024  Impression: Study limited by insomnia Negative for significant sleep disordered breathing with AHI of 2.0 with O2 nadir of 92%  Recommendation: Clinical follow-up, further evaluation of contributors to insomnia Avoid alcohol, sedatives and other CNS depressants that may worsen sleep apnea and disrupt normal sleep architecture. Sleep hygiene should be reviewed to assess factors that may improve sleep quality. Weight management and regular exercise should be initiated or continue  Follow-up as previously scheduled

## 2024-11-10 NOTE — Procedures (Signed)
  Indications for Polysomnography The patient is a 68 year-old Female who is 5' 4 and weighs 243.0 lbs. Her BMI equals 42.0.  A full night polysomnogram was performed to evaluate for -.  Medication was administered at 2100TylenolEszopicloneGabapentinMetoprololQuetiapine FumarateSenna Polysomnogram Data A full night polysomnogram recorded the standard physiologic parameters including EEG, EOG, EMG, EKG, nasal and oral airflow.  Respiratory parameters of chest and abdominal movements were recorded with Respiratory Inductance Plethysmography belts.   Oxygen saturation was recorded by pulse oximetry.  Sleep Architecture The total recording time of the polysomnogram was 420.6 minutes.  The total sleep time was 152.5 minutes.  The patient spent 8.5% of total sleep time in Stage N1, 91.5% in Stage N2, 0.0% in Stages N3, and 0.0% in REM.  Sleep latency was 29.4 minutes.   REM latency was - minutes.  Sleep Efficiency was 36.3%.  Wake after Sleep Onset time was 238.5 minutes.  Respiratory Events The polysomnogram revealed a presence of - obstructive, - central, and - mixed apneas resulting in an Apnea index of - events per hour.  There were 5 hypopneas (GreaterEqual to3% desaturation and/or arousal) resulting in an Apnea\Hypopnea Index (AHI  GreaterEqual to3% desaturation and/or arousal) of 2.0 events per hour.  There were 2 hypopneas (GreaterEqual to4% desaturation) resulting in an Apnea\Hypopnea Index (AHI GreaterEqual to4% desaturation) of 0.8 events per hour.  There were - Respiratory  Effort Related Arousals resulting in a RERA index of - events per hour. The Respiratory Disturbance Index is 2.0 events per hour.  The snore index was - events per hour.  Mean oxygen saturation was 94.9%.  The lowest oxygen saturation during sleep was 92.0%.  Time spent LessEqual to88% oxygen saturation was  minutes ().  Limb Activity There were 4 total limb movements recorded, of this total, 4 were classified as PLMs.   PLM index was 1.6 per hour and PLM associated with Arousals index was 0.4 per hour.  Cardiac Summary The average pulse rate was 61.4 bpm.  The minimum pulse rate was 54.0 bpm while the maximum pulse rate was 87.0 bpm.  Cardiac rhythm was normal/abnormal.  Comments:  Diagnosis: Negative study for significant sleep disordered breathing with AHI of 2.0.  O2 nadir of 92% Study limited by insomnia Poor sleep efficiency with difficulty initiating and maintaining sleep, increased wake after sleep onset. Mild snoring No significant periodic limb movement Cardiac rhythm was sinus  Recommendations:  Further evaluation and management of insomnia Avoid alcohol, sedatives and other CNS depressants that may worsen sleep apnea and disrupt normal sleep architecture. Sleep hygiene should be reviewed to assess factors that may improve sleep quality. Weight management and regular exercise should be initiated or continue  This study was personally reviewed and electronically signed by: [phone] Accredited Board Certified in Sleep Medicine Date/Time:  11/10/2024

## 2024-11-10 NOTE — Procedures (Signed)
 [facility] [address] [place], [place] [address] Tel: [phone]   Fax: [phone]  Polysomnography Interpretation  Patient Name:  Bridget [name], Bridget [name] Date:  10/21/2024 Referring Physician:  JENNET EPLEY [phone]) %%startinterp%% Indications for Polysomnography The patient is a 68 year-old Female who is 5' 4 and weighs 243.0 lbs. Her BMI equals 42.0.  A full night polysomnogram was performed to evaluate for -.  Medication was administered at 2100  Tylenol   Eszopiclone   Gabapentin  Metoprolol   Quetiapine  Fumarate  Senna   Polysomnogram Data A full night polysomnogram recorded the standard physiologic parameters including EEG, EOG, EMG, EKG, nasal and oral airflow.  Respiratory parameters of chest and abdominal movements were recorded with Respiratory Inductance Plethysmography belts.  Oxygen saturation was recorded by pulse oximetry.   Sleep Architecture The total recording time of the polysomnogram was 420.6 minutes.  The total sleep time was 152.5 minutes.  The patient spent 8.5% of total sleep time in Stage N1, 91.5% in Stage N2, 0.0% in Stages N3, and 0.0% in REM.  Sleep latency was 29.4 minutes.  REM latency was - minutes.  Sleep Efficiency was 36.3%.  Wake after Sleep Onset time was 238.5 minutes.  Respiratory Events The polysomnogram revealed a presence of - obstructive, - central, and - mixed apneas resulting in an Apnea index of - events per hour.  There were 5 hypopneas (>=3% desaturation and/or arousal) resulting in an Apnea\Hypopnea Index (AHI >=3% desaturation and/or arousal) of 2.0 events per hour.  There were 2 hypopneas (>=4% desaturation) resulting in an Apnea\Hypopnea Index (AHI >=4% desaturation) of 0.8 events per hour.  There were - Respiratory Effort Related Arousals resulting in a RERA index of - events per hour. The Respiratory Disturbance Index is 2.0 events per hour.  The snore index was - events per  hour.  Mean oxygen saturation was 94.9%.  The lowest oxygen saturation during sleep was 92.0%.  Time spent <=88% oxygen saturation was - minutes (-).  Limb Activity There were 4 total limb movements recorded, of this total, 4 were classified as PLMs.  PLM index was 1.6 per hour and PLM associated with Arousals index was 0.4 per hour.  Cardiac Summary The average pulse rate was 61.4 bpm.  The minimum pulse rate was 54.0 bpm while the maximum pulse rate was 87.0 bpm.  Cardiac rhythm was normal/abnormal.  Comments:   Diagnosis:  Negative study for significant sleep disordered breathing with AHI of 2.0.  O2 nadir of 92% Study limited by insomnia Poor sleep efficiency with difficulty initiating and maintaining sleep, increased wake after sleep onset. Mild snoring No significant periodic limb movement Cardiac rhythm was sinus  Recommendations:  Further evaluation and management of insomnia Avoid alcohol, sedatives and other CNS depressants that may worsen sleep apnea and disrupt normal sleep architecture. Sleep hygiene should be reviewed to assess factors that may improve sleep quality. Weight management and regular exercise should be initiated or continue  This study was personally reviewed and electronically signed by: [phone] Accredited Board Certified in Sleep Medicine Date/Time:   11/10/2024  %%endinterp%%   Diagnostic PSG Report  Patient Name: Bridget [name], Bridget [name] Study Date: 10/21/2024  Date of Birth: [birth year] Study Type: Diagnostic  Age: 57 year MRN #: [id number]  Sex: Female Interpreting Physician: EPLEY JENNET, [id number]  Height: 5' 4 Referring Physician: JENNET EPLEY ([id number])  Weight: 243.0 lbs Recording Tech: Firman Anon RPSGT RST  BMI: 42.0 Scoring Tech: Firman Anon RPSGT RST  ESS: 0 Neck Size:  13.5   Study Overview  Lights Off: 10:32:13 PM  Count Index  Lights On: 05:32:50 AM Awakenings: 22 8.7  Time in Bed: 420.6 min. Arousals: 21 8.3  Total Sleep  Time: 152.5 min. AHI (>=3% Desat and/or Ar.): 5 2.0   Sleep Efficiency: 36.3% AHI (>=4% Desat): 2 0.8   Sleep Latency: 29.4 min. Limb Movements: 4 1.6  Wake After Sleep Onset: 238.5 min. Snore: - -  REM Latency from Sleep Onset: - min. Desaturations: 9 3.5     Minimum SpO2 TST: 92.0%    Sleep Architecture  % of Time in Bed Stages Time (mins) % Sleep Time  Wake 268.5   Stage N1 13.0 8.5%  Stage N2 139.5 91.5%  Stage N3 0.0 0.0%  REM 0.0 0.0%   Arousal Summary   NREM REM Sleep Index  Respiratory Arousals 2 - 2 0.8  PLM Arousals 1 - 1 0.4  Isolated Limb Movement Arousals - - - -  Snore Arousals - - - -  Spontaneous Arousals 18 - 18 7.1  Total 21 - 21 8.3   Limb Movement Summary   Count Index  Isolated Limb Movements - -  Periodic Limb Movements (PLMs) 4 1.6  Total Limb Movements 4 1.6    Respiratory Summary   By Sleep Stage By Body Position Total   NREM REM Supine Non-Supine   Time (min) 152.5 0.0 39.5 113.0 152.5         Obstructive Apnea - - - - -  Mixed Apnea - - - - -  Central Apnea - - - - -  Total Apneas - - - - -  Total Apnea Index - - - - -         Hypopneas (>=3% Desat and/or Ar.) 5 - 4 1 5   AHI (>=3% Desat and/or Ar.) 2.0 - 6.1 0.5 2.0         Hypopneas (>=4% Desat) 2 - 2 - 2  AHI (>=4% Desat) 0.8 - 3.0 - 0.8          RERAs - - - - -  RERA Index - - - - -         RDI 2.0 - 6.1 0.5 2.0    Respiratory Event Type Index  Central Apneas -  Obstructive Apneas -  Mixed Apneas -  Central Hypopneas -  Obstructive Hypopneas -  Central Apnea + Hypopnea (CAHI) -  Obstructive Apnea + Hypopnea (OAHI) 2.4   Respiratory Event Durations   Apnea Hypopnea   NREM REM NREM REM  Average (seconds) - - 29.9 -  Maximum (seconds) - - 40.7 -    Oxygen Saturation Summary   Wake NREM REM TST TIB  Average SpO2 (%) 95.1% 94.4% - 94.4% 94.9%  Minimum SpO2 (%) 89.0% 92.0% - 92.0% 89.0%  Maximum SpO2 (%) 99.0% 98.0% - 98.0% 99.0%   Oxygen Saturation  Distribution  Range (%) Time in range (min) Time in range (%)  90.0 - 100.0 393.9 100.0%  80.0 - 90.0 0.1 0.0%  70.0 - 80.0 - -  60.0 - 70.0 - -  50.0 - 60.0 - -  0.0 - 50.0 - -  Time Spent <=88% SpO2  Range (%) Time in range (min) Time in range (%)  0.0 - 88.0 - -      Count Index  Desaturations 9 3.5    Cardiac Summary   Wake NREM REM Sleep Total  Average Pulse Rate (BPM) 62.0 60.5 - 60.5 61.4  Minimum Pulse Rate (BPM) 54.0 54.0 - 54.0 54.0  Maximum Pulse Rate (BPM) 87.0 73.0 - 73.0 87.0   Pulse Rate Distribution:  Range (bpm) Time in range (min) Time in range (%)  0.0 - 40.0 - -  40.0 - 60.0 181.7 46.1%  60.0 - 80.0 204.4 51.8%  80.0 - 100.0 0.9 0.2%  100.0 - 120.0 - -  120.0 - 140.0 - -  140.0 - 200.0 - -      Hypnograms                      Technologist Comments  THE 45-YEAR-OLD FEMALE PATIENT PRESENTED TO THE SLEEP DISORDER CENTER FOR A NPSG DIAGNOSTIC STUDY WITH A CHIEF COMPLAINT OF OSA. BEDTIME MEDICATIONS WERE SELF ADMINISTERED 2100. THE LEAD PLACEMENT WAS INITIATED, THEN THE STUDY WAS BEGUN. MILD SNORING WAS NOTED THROUGHOUT THE STUDY. SUPPLEMENTAL OXYGEN WAS NOT WARRANTED DURING THE STUDY. PLMs - PLMAs WERE NOTED. TWO RESTROOM VISITS WERE MADE. BRADYCARDIA WAS OBSERVED. THE PATIENT HAD A HARD TIME INITIATING AND MAINTAINING SLEEP DURING THE NIGHT. THE PATIENT TOLERATED THE NPSG STUDY WELL.

## 2024-11-16 NOTE — Telephone Encounter (Signed)
ATC x1.  LMTCB. 

## 2024-11-25 NOTE — Telephone Encounter (Signed)
 [id number]   Call second number on list-[phone]

## 2024-11-29 NOTE — Telephone Encounter (Signed)
 I called and spoke to pt. Pt informed of Dr Cathye note and verbalized understanding. NFN

## 2024-12-27 ENCOUNTER — Ambulatory Visit: Payer: Self-pay | Admitting: Obstetrics and Gynecology

## 2025-01-09 ENCOUNTER — Ambulatory Visit: Payer: Medicare (Managed Care) | Admitting: Student in an Organized Health Care Education/Training Program

## 2025-01-18 ENCOUNTER — Ambulatory Visit: Payer: Medicare (Managed Care) | Admitting: Pulmonary Disease

## 2025-01-18 ENCOUNTER — Encounter: Payer: Self-pay | Admitting: Pulmonary Disease

## 2025-01-18 VITALS — BP 128/90 | HR 77 | Ht 64.0 in | Wt 251.0 lb

## 2025-01-18 DIAGNOSIS — F5104 Psychophysiologic insomnia: Secondary | ICD-10-CM | POA: Diagnosis not present

## 2025-01-18 DIAGNOSIS — E66813 Obesity, class 3: Secondary | ICD-10-CM

## 2025-01-18 DIAGNOSIS — F319 Bipolar disorder, unspecified: Secondary | ICD-10-CM | POA: Diagnosis not present

## 2025-01-18 DIAGNOSIS — F250 Schizoaffective disorder, bipolar type: Secondary | ICD-10-CM

## 2025-01-18 MED ORDER — MIRTAZAPINE 7.5 MG PO TABS: 7.5000 mg | ORAL_TABLET | Freq: Every day | ORAL | 1 refills | Status: DC

## 2025-01-18 MED ORDER — MIRTAZAPINE 7.5 MG PO TABS: 7.5000 mg | ORAL_TABLET | Freq: Every day | ORAL | 1 refills | Status: AC

## 2025-01-18 NOTE — Progress Notes (Signed)
 [address]              Bridget [name]    [id number]    10/11/1956  Primary Care Physician:Reed, Annabella CROME, DO  Referring Physician: Cloria Annabella CROME, DO [address] [place],  [place] [address]  Chief complaint:   Patient being seen today for chronic insomnia.   In for follow-up today  HPI: No significant changes in her health  We had tried Lunesta  during her last visit - She does not feel this is helping at all  We have tried different classes of medications and she is not benefiting from any of the medications Essentially run out of medication classes that can be tried  Failed Ambien previously, Restoril did not work, trazodone  did not help, Quviviq , Belsomra did not help and most recently Lunesta   She had a sleep study that was significantly limited by insomnia  She had had a previous sleep study 10 to 15 years ago, does not recollect the result and was not started on CPAP at the time  Tries to go to bed at about 9 PM, sometimes not able to sleep at all and sometimes gets some sleep Wakes up in the morning not feeling rested  Continues to feel tired during the day  Recently had knee replacement  She does have a history of schizoaffective disorder-bipolar type, generalized anxiety disorder  Outpatient Encounter Medications as of 01/18/2025  Medication Sig   acetaminophen  (TYLENOL ) 500 MG tablet Take 500 mg by mouth every 6 (six) hours as needed.   ARIPiprazole  ER (ABILIFY  MAINTENA) 400 MG PRSY prefilled syringe Inject 400 mg into the muscle every 28 (twenty-eight) days.   Calcium  Carb-Cholecalciferol (CALCIUM  600+D3) 600-10 MG-MCG TABS Take 1 tablet by mouth in the morning and at bedtime. (Patient taking differently: Take 1 tablet by mouth as needed.)   CEQUA 0.09 % SOLN Place 1 drop into both eyes every 12 (twelve) hours.   DULoxetine  (CYMBALTA ) 60 MG capsule Take 120 mg by mouth in the morning.   eszopiclone  (LUNESTA ) 2 MG TABS tablet Take 1 tablet (2 mg total) by mouth at  bedtime as needed for sleep. Take immediately before bedtime   gabapentin (NEURONTIN) 100 MG capsule Take 100-200 mg by mouth at bedtime.   latanoprost (XALATAN) 0.005 % ophthalmic solution Place 1 drop into both eyes at bedtime.   meloxicam  (MOBIC ) 15 MG tablet Take 15 mg by mouth daily.   metoprolol  tartrate (LOPRESSOR ) 25 MG tablet Take 12.5 mg by mouth 2 (two) times daily.   mirtazapine  (REMERON ) 7.5 MG tablet Take 1 tablet (7.5 mg total) by mouth at bedtime.   pantoprazole  (PROTONIX ) 40 MG tablet Take 40 mg by mouth daily before breakfast.   polyethylene glycol (MIRALAX  / GLYCOLAX ) packet Take 17 g by mouth daily.   QUEtiapine  (SEROQUEL ) 100 MG tablet Take 100 mg by mouth at bedtime.   rosuvastatin  (CRESTOR ) 20 MG tablet Take 20 mg by mouth daily.   senna (SENOKOT) 8.6 MG TABS tablet Take 8.6 mg by mouth at bedtime.   lidocaine  (LIDODERM ) 5 % Place 1 patch onto the skin daily as needed (for lower back pain- Remove & Discard patch within 12 hours or as directed by MD). (Patient not taking: Reported on 01/18/2025)   Rectal Protectant-Emollient (CALMOL-4) 76-10 % SUPP Use as directed (Patient not taking: Reported on 01/18/2025)   No facility-administered encounter medications on file as of 01/18/2025.    Allergies as of 01/18/2025 - Review Complete 01/18/2025  Allergen Reaction  Noted   Flexeril [cyclobenzaprine] Other (See Comments) 09/22/2017   Amitriptyline Other (See Comments) 09/22/2017   Penicillins Rash 09/22/2017    Past Medical History:  Diagnosis Date   Acute sinusitis 02/06/2016   Age-related nuclear cataract of both eyes 01/26/2017   Allergic rhinitis due to pollen 02/07/2016   Angina pectoris 09/03/2023   Anxiety    Auditory hallucinations 07/08/2016   Back pain    low back - arthritis   Barrett's esophagus    Bilateral presbyopia 01/26/2017   Brachial neuritis or radiculitis 02/06/2016   Bursitis of left hip 02/06/2016   Cardiac murmur 09/03/2023   Cervical neuritis  02/06/2016   Chronic bilateral low back pain without sciatica 06/25/2023   Chronic headaches    Complete tear of right rotator cuff 10/30/2021   Constipation    miralax  daily   Corneal dystrophy, anterior 01/26/2017   Cortical senile cataract of both eyes 01/26/2017   DDD (degenerative disc disease), cervical 02/07/2016   DDD (degenerative disc disease), lumbosacral 01/03/2014   Depression    Dermatitis, eczematoid 02/06/2016   Disorder of bursae and tendons in shoulder region 02/06/2016   Dry mouth    biotene   Dysphagia 11/02/2022   Enthesopathy of ankle and tarsus 02/06/2016   Fainting 02/06/2016   Fibromyalgia    GAD (generalized anxiety disorder) 01/24/2020   Generalized osteoarthrosis 09/15/2013   GERD (gastroesophageal reflux disease)    Glossitis 02/07/2016   Heart murmur    Hyperlipemia    Hypertension    Insomnia    Joint pain, knee 02/07/2016   Menopausal disorder 02/07/2016   Migraine headache 02/07/2016   Morbid obesity (HCC) 02/07/2016   Myalgia 02/07/2016   Myoclonus    patient states no longer an issue   Myositis 02/07/2016   Obesity 02/07/2016   Osteoarthritis    lower back, shoulders, hands   Periodic limb movement disorder 02/07/2016   Plantar fasciitis 02/07/2016   Primary osteoarthritis of left knee 01/28/2022   Primary osteoarthritis of right knee 01/28/2022   Referred otalgia 02/07/2016   Restless leg syndrome 02/07/2016   Schizoaffective disorder (HCC)    Schizoaffective disorder, bipolar type (HCC) 02/04/2018   Shoulder pain 02/07/2016   Status post total left knee replacement 02/08/2024   SVD (spontaneous vaginal delivery)    x 3   Toxic effect of secondhand tobacco smoke 02/07/2016   Type II diabetes mellitus (HCC) 04/07/2014   Vaginal atrophy 02/07/2016   Vertigo    Visual hallucination 07/08/2016   Vulvar atrophy 02/07/2016   Wears dentures    full    Past Surgical History:  Procedure Laterality Date   ABDOMINAL HYSTERECTOMY      BREAST EXCISIONAL BIOPSY Right    BREAST EXCISIONAL BIOPSY Right    BREAST EXCISIONAL BIOPSY Right    BREAST EXCISIONAL BIOPSY Right    BUNIONECTOMY Bilateral    COLONOSCOPY  08/2013   HAND SURGERY Right    carpel tunnel surgery   INJECTION KNEE Bilateral 10/30/2021   Procedure: BILATERAL KNEE INJECTIONS;  Surgeon: Jerri Kay HERO, MD;  Location: [facility];  Service: Orthopedics;  Laterality: Bilateral;   MULTIPLE TOOTH EXTRACTIONS     full dentures   SHOULDER ARTHROSCOPY WITH ROTATOR CUFF REPAIR AND SUBACROMIAL DECOMPRESSION Right 10/30/2021   Procedure: RIGHT SHOULDER ARTHROSCOPY WITH ROTATOR CUFF REPAIR AND SUBACROMIAL DECOMPRESSION, DEBRIDEMENT;  Surgeon: Jerri Kay HERO, MD;  Location:  SURGERY CENTER;  Service: Orthopedics;  Laterality: Right;   TOTAL KNEE ARTHROPLASTY Left 02/08/2024  Procedure: LEFT TOTAL KNEE REPLACEMENT;  Surgeon: Jerri Kay HERO, MD;  Location: [facility];  Service: Orthopedics;  Laterality: Left;   TRIGGER FINGER RELEASE Bilateral    thumbs    Family History  Problem Relation Age of Onset   Diabetes Mother    Prostate cancer Father    High blood pressure Sister    Colon cancer Neg Hx    Colon polyps Neg Hx    Rectal cancer Neg Hx    Stomach cancer Neg Hx     Social History   Socioeconomic History   Marital status: Divorced    Spouse name: Not on file   Number of children: 7   Years of education: Not on file   Highest education level: Not on file  Occupational History   Not on file  Tobacco Use   Smoking status: Never   Smokeless tobacco: Never  Vaping Use   Vaping status: Never Used  Substance and Sexual Activity   Alcohol use: No   Drug use: No   Sexual activity: Not Currently    Birth control/protection: Post-menopausal    Comment: Hysterectomy  Other Topics Concern   Not on file  Social History Narrative   Not on file   Social Drivers of Health   Tobacco Use: Low Risk (01/18/2025)   Patient History     Smoking Tobacco Use: Never    Smokeless Tobacco Use: Never    Passive Exposure: Not on file  Financial Resource Strain: Not on file  Food Insecurity: No Food Insecurity (02/08/2024)   Hunger Vital Sign    Worried About Running Out of Food in the Last Year: Never true    Ran Out of Food in the Last Year: Never true  Transportation Needs: No Transportation Needs (02/08/2024)   PRAPARE - Administrator, Civil Service (Medical): No    Lack of Transportation (Non-Medical): No  Physical Activity: Not on file  Stress: Not on file  Social Connections: Moderately Integrated (02/08/2024)   Social Connection and Isolation Panel    Frequency of Communication with Friends and Family: More than three times a week    Frequency of Social Gatherings with Friends and Family: More than three times a week    Attends Religious Services: More than 4 times per year    Active Member of [employer] or Organizations: Yes    Attends Engineer, Structural: More than 4 times per year    Marital Status: Divorced  Intimate Partner Violence: Not At Risk (02/08/2024)   Humiliation, Afraid, Rape, and Kick questionnaire    Fear of Current or Ex-Partner: No    Emotionally Abused: No    Physically Abused: No    Sexually Abused: No  Depression (PHQ2-9): Low Risk (11/03/2024)   Depression (PHQ2-9)    PHQ-2 Score: 0  Alcohol Screen: Not on file  Housing: Low Risk (02/08/2024)   Housing Stability Vital Sign    Unable to Pay for Housing in the Last Year: No    Number of Times Moved in the Last Year: 0    Homeless in the Last Year: No  Utilities: Not At Risk (02/08/2024)   [facility] Utilities    Threatened with loss of utilities: No  Health Literacy: Not on file    Review of Systems  Constitutional:  Positive for fatigue.  Psychiatric/Behavioral:  Positive for sleep disturbance.   All other systems reviewed and are negative.   Vitals:   01/18/25 1328  BP: (!) 147/87  Pulse: 77  SpO2: 96%     Physical  Exam Constitutional:      Appearance: She is obese.  HENT:     Head: Normocephalic and atraumatic.     Nose: Nose normal.     Mouth/Throat:     Mouth: Mucous membranes are moist.     Comments: Mallampati 3, crowded oropharynx Eyes:     General: No scleral icterus.    Extraocular Movements: Extraocular movements intact.     Pupils: Pupils are equal, round, and reactive to light.  Cardiovascular:     Rate and Rhythm: Normal rate and regular rhythm.     Heart sounds: No murmur heard.    No friction rub.  Pulmonary:     Effort: No respiratory distress.     Breath sounds: No stridor. No wheezing or rhonchi.  Musculoskeletal:        General: No swelling. Normal range of motion.     Cervical back: No rigidity or tenderness.  Skin:    General: Skin is warm.  Neurological:     General: No focal deficit present.     Mental Status: She is alert.  Psychiatric:        Mood and Affect: Mood normal.    Assessment:  Chronic insomnia - Has failed multiple medications - Talked about trialing mirtazapine  - Side effect profile of mirtazapine  including weight gain was discussed with the patient today  Insomnia likely related to medical condition Bipolar disorder  Class III obesity  Deconditioning  Plan/Recommendations:  Mirtazapine  7.5 mg prescribed  Greater activities as tolerated  Encouraged regular activities, exercise  Be aware of a side effect profile of mirtazapine  which is weight gain to ensure she does not put on a lot of weight  Encouraged to call with significant concerns  I will see her back in about 2 months  Jennet Epley MD [facility] 01/18/2025, 1:49 PM  CC: Cloria Riggs L, DO    "

## 2025-01-18 NOTE — Patient Instructions (Signed)
 I will see you back in about 4 to 6 weeks  I have put in a prescription for mirtazapine   It can help with sleep, if it helps but not significant enough we can go to a higher dose  Side effect is mostly that it can make you want to eat a little bit more and gaining weight so you be aware of this, may be able to moderate this  Call us  with significant concerns  Make sure you look into cognitive behavioral therapy as this may help  Seeing a behavioral therapist may help with insomnia

## 2025-01-30 ENCOUNTER — Ambulatory Visit: Payer: Medicare (Managed Care) | Admitting: Student in an Organized Health Care Education/Training Program

## 2025-02-08 ENCOUNTER — Ambulatory Visit: Payer: Self-pay | Admitting: Obstetrics and Gynecology
